# Patient Record
Sex: Female | Born: 1935 | Race: White | Hispanic: No | Smoking: Never smoker
Health system: Southern US, Community
[De-identification: ages and names within clinical notes are randomized; demographics above are authoritative.]

## PROBLEM LIST (undated history)

## (undated) DIAGNOSIS — I429 Cardiomyopathy, unspecified: Secondary | ICD-10-CM

## (undated) DIAGNOSIS — I10 Essential (primary) hypertension: Secondary | ICD-10-CM

## (undated) DIAGNOSIS — D126 Benign neoplasm of colon, unspecified: Secondary | ICD-10-CM

## (undated) DIAGNOSIS — E119 Type 2 diabetes mellitus without complications: Secondary | ICD-10-CM

## (undated) DIAGNOSIS — D62 Acute posthemorrhagic anemia: Secondary | ICD-10-CM

## (undated) DIAGNOSIS — K5792 Diverticulitis of intestine, part unspecified, without perforation or abscess without bleeding: Secondary | ICD-10-CM

## (undated) DIAGNOSIS — I4891 Unspecified atrial fibrillation: Secondary | ICD-10-CM

## (undated) DIAGNOSIS — E0590 Thyrotoxicosis, unspecified without thyrotoxic crisis or storm: Secondary | ICD-10-CM

## (undated) DIAGNOSIS — E785 Hyperlipidemia, unspecified: Secondary | ICD-10-CM

## (undated) DIAGNOSIS — F32A Depression, unspecified: Secondary | ICD-10-CM

## (undated) DIAGNOSIS — D569 Thalassemia, unspecified: Secondary | ICD-10-CM

## (undated) DIAGNOSIS — D509 Iron deficiency anemia, unspecified: Secondary | ICD-10-CM

## (undated) DIAGNOSIS — E7800 Pure hypercholesterolemia, unspecified: Secondary | ICD-10-CM

## (undated) DIAGNOSIS — D649 Anemia, unspecified: Secondary | ICD-10-CM

## (undated) DIAGNOSIS — F0390 Unspecified dementia without behavioral disturbance: Secondary | ICD-10-CM

## (undated) HISTORY — DX: Pure hypercholesterolemia, unspecified: E78.00

## (undated) HISTORY — DX: Cardiomyopathy, unspecified: I42.9

## (undated) HISTORY — DX: Diverticulitis of intestine, part unspecified, without perforation or abscess without bleeding: K57.92

## (undated) HISTORY — DX: Thalassemia, unspecified: D56.9

## (undated) HISTORY — DX: Unspecified atrial fibrillation: I48.91

## (undated) HISTORY — PX: THYROIDECTOMY, PARTIAL: SHX18

## (undated) HISTORY — DX: Iron deficiency anemia, unspecified: D50.9

## (undated) HISTORY — DX: Benign neoplasm of colon, unspecified: D12.6

## (undated) HISTORY — PX: FOOT SURGERY: SHX648

## (undated) HISTORY — DX: Essential (primary) hypertension: I10

## (undated) HISTORY — DX: Thyrotoxicosis, unspecified without thyrotoxic crisis or storm: E05.90

## (undated) HISTORY — PX: CHOLECYSTECTOMY: SHX55

---

## 2000-05-05 ENCOUNTER — Encounter: Payer: Self-pay | Admitting: Neurosurgery

## 2000-05-05 ENCOUNTER — Ambulatory Visit (HOSPITAL_COMMUNITY): Admission: RE | Admit: 2000-05-05 | Discharge: 2000-05-05 | Payer: Self-pay | Admitting: Neurosurgery

## 2001-02-05 ENCOUNTER — Encounter: Payer: Self-pay | Admitting: Obstetrics and Gynecology

## 2001-02-05 ENCOUNTER — Ambulatory Visit (HOSPITAL_COMMUNITY): Admission: RE | Admit: 2001-02-05 | Discharge: 2001-02-05 | Payer: Self-pay | Admitting: Obstetrics and Gynecology

## 2001-04-13 ENCOUNTER — Ambulatory Visit (HOSPITAL_COMMUNITY): Admission: RE | Admit: 2001-04-13 | Discharge: 2001-04-13 | Payer: Self-pay | Admitting: Obstetrics and Gynecology

## 2001-04-13 ENCOUNTER — Encounter: Payer: Self-pay | Admitting: Obstetrics and Gynecology

## 2001-05-05 ENCOUNTER — Encounter: Payer: Self-pay | Admitting: Neurosurgery

## 2001-05-05 ENCOUNTER — Ambulatory Visit (HOSPITAL_COMMUNITY): Admission: RE | Admit: 2001-05-05 | Discharge: 2001-05-05 | Payer: Self-pay | Admitting: Neurosurgery

## 2001-05-25 ENCOUNTER — Other Ambulatory Visit: Admission: RE | Admit: 2001-05-25 | Discharge: 2001-05-25 | Payer: Self-pay | Admitting: Obstetrics and Gynecology

## 2001-12-21 ENCOUNTER — Encounter (HOSPITAL_COMMUNITY): Admission: RE | Admit: 2001-12-21 | Discharge: 2002-01-20 | Payer: Self-pay | Admitting: Neurosurgery

## 2002-05-05 ENCOUNTER — Encounter: Payer: Self-pay | Admitting: Neurosurgery

## 2002-05-05 ENCOUNTER — Ambulatory Visit (HOSPITAL_COMMUNITY): Admission: RE | Admit: 2002-05-05 | Discharge: 2002-05-05 | Payer: Self-pay | Admitting: Neurosurgery

## 2002-05-17 ENCOUNTER — Encounter: Payer: Self-pay | Admitting: Neurosurgery

## 2002-05-17 ENCOUNTER — Ambulatory Visit (HOSPITAL_COMMUNITY): Admission: RE | Admit: 2002-05-17 | Discharge: 2002-05-17 | Payer: Self-pay | Admitting: Neurosurgery

## 2002-07-19 ENCOUNTER — Ambulatory Visit (HOSPITAL_COMMUNITY): Admission: RE | Admit: 2002-07-19 | Discharge: 2002-07-19 | Payer: Self-pay | Admitting: Obstetrics & Gynecology

## 2002-07-19 ENCOUNTER — Encounter: Payer: Self-pay | Admitting: Obstetrics & Gynecology

## 2003-04-20 ENCOUNTER — Ambulatory Visit (HOSPITAL_COMMUNITY): Admission: RE | Admit: 2003-04-20 | Discharge: 2003-04-20 | Payer: Self-pay | Admitting: Internal Medicine

## 2003-04-22 ENCOUNTER — Ambulatory Visit (HOSPITAL_COMMUNITY): Admission: RE | Admit: 2003-04-22 | Discharge: 2003-04-22 | Payer: Self-pay | Admitting: Internal Medicine

## 2003-04-22 ENCOUNTER — Encounter: Payer: Self-pay | Admitting: Internal Medicine

## 2003-08-02 ENCOUNTER — Ambulatory Visit (HOSPITAL_COMMUNITY): Admission: RE | Admit: 2003-08-02 | Discharge: 2003-08-02 | Payer: Self-pay | Admitting: Internal Medicine

## 2004-09-04 ENCOUNTER — Ambulatory Visit (HOSPITAL_COMMUNITY): Admission: RE | Admit: 2004-09-04 | Discharge: 2004-09-04 | Payer: Self-pay | Admitting: Internal Medicine

## 2005-08-15 ENCOUNTER — Ambulatory Visit (HOSPITAL_COMMUNITY): Admission: RE | Admit: 2005-08-15 | Discharge: 2005-08-15 | Payer: Self-pay | Admitting: *Deleted

## 2005-10-08 ENCOUNTER — Ambulatory Visit (HOSPITAL_COMMUNITY): Admission: RE | Admit: 2005-10-08 | Discharge: 2005-10-08 | Payer: Self-pay | Admitting: Obstetrics & Gynecology

## 2006-02-06 ENCOUNTER — Emergency Department (HOSPITAL_COMMUNITY): Admission: EM | Admit: 2006-02-06 | Discharge: 2006-02-06 | Payer: Self-pay | Admitting: *Deleted

## 2006-07-02 ENCOUNTER — Ambulatory Visit (HOSPITAL_COMMUNITY): Admission: RE | Admit: 2006-07-02 | Discharge: 2006-07-02 | Payer: Self-pay | Admitting: Family Medicine

## 2006-10-21 ENCOUNTER — Ambulatory Visit (HOSPITAL_COMMUNITY): Admission: RE | Admit: 2006-10-21 | Discharge: 2006-10-21 | Payer: Self-pay | Admitting: Obstetrics & Gynecology

## 2007-02-17 ENCOUNTER — Encounter (HOSPITAL_COMMUNITY): Admission: RE | Admit: 2007-02-17 | Discharge: 2007-03-19 | Payer: Self-pay | Admitting: Endocrinology

## 2007-09-25 ENCOUNTER — Ambulatory Visit (HOSPITAL_COMMUNITY): Admission: RE | Admit: 2007-09-25 | Discharge: 2007-09-25 | Payer: Self-pay | Admitting: Family Medicine

## 2007-11-04 ENCOUNTER — Ambulatory Visit (HOSPITAL_COMMUNITY): Admission: RE | Admit: 2007-11-04 | Discharge: 2007-11-04 | Payer: Self-pay | Admitting: Internal Medicine

## 2007-11-17 ENCOUNTER — Ambulatory Visit (HOSPITAL_COMMUNITY): Admission: RE | Admit: 2007-11-17 | Discharge: 2007-11-17 | Payer: Self-pay | Admitting: General Surgery

## 2008-01-14 ENCOUNTER — Ambulatory Visit (HOSPITAL_COMMUNITY): Admission: RE | Admit: 2008-01-14 | Discharge: 2008-01-14 | Payer: Self-pay | Admitting: Internal Medicine

## 2008-01-15 ENCOUNTER — Inpatient Hospital Stay (HOSPITAL_COMMUNITY): Admission: EM | Admit: 2008-01-15 | Discharge: 2008-01-18 | Payer: Self-pay | Admitting: Emergency Medicine

## 2008-05-16 ENCOUNTER — Other Ambulatory Visit: Admission: RE | Admit: 2008-05-16 | Discharge: 2008-05-16 | Payer: Self-pay | Admitting: Obstetrics and Gynecology

## 2008-09-19 ENCOUNTER — Emergency Department (HOSPITAL_COMMUNITY): Admission: EM | Admit: 2008-09-19 | Discharge: 2008-09-19 | Payer: Self-pay | Admitting: Emergency Medicine

## 2008-09-23 ENCOUNTER — Encounter (INDEPENDENT_AMBULATORY_CARE_PROVIDER_SITE_OTHER): Payer: Self-pay | Admitting: *Deleted

## 2008-10-11 ENCOUNTER — Ambulatory Visit (HOSPITAL_COMMUNITY): Payer: Self-pay | Admitting: Oncology

## 2008-10-24 DIAGNOSIS — I10 Essential (primary) hypertension: Secondary | ICD-10-CM | POA: Insufficient documentation

## 2008-10-24 DIAGNOSIS — Z8639 Personal history of other endocrine, nutritional and metabolic disease: Secondary | ICD-10-CM

## 2008-10-24 DIAGNOSIS — Z862 Personal history of diseases of the blood and blood-forming organs and certain disorders involving the immune mechanism: Secondary | ICD-10-CM

## 2008-10-24 DIAGNOSIS — M810 Age-related osteoporosis without current pathological fracture: Secondary | ICD-10-CM | POA: Insufficient documentation

## 2008-10-24 DIAGNOSIS — E7800 Pure hypercholesterolemia, unspecified: Secondary | ICD-10-CM

## 2008-10-24 DIAGNOSIS — E089 Diabetes mellitus due to underlying condition without complications: Secondary | ICD-10-CM | POA: Insufficient documentation

## 2008-10-24 DIAGNOSIS — D509 Iron deficiency anemia, unspecified: Secondary | ICD-10-CM | POA: Insufficient documentation

## 2008-10-25 ENCOUNTER — Ambulatory Visit: Payer: Self-pay | Admitting: Internal Medicine

## 2008-10-25 DIAGNOSIS — D568 Other thalassemias: Secondary | ICD-10-CM

## 2008-10-25 DIAGNOSIS — D539 Nutritional anemia, unspecified: Secondary | ICD-10-CM | POA: Insufficient documentation

## 2008-10-25 DIAGNOSIS — K5732 Diverticulitis of large intestine without perforation or abscess without bleeding: Secondary | ICD-10-CM

## 2008-10-26 DIAGNOSIS — I4891 Unspecified atrial fibrillation: Secondary | ICD-10-CM | POA: Insufficient documentation

## 2008-10-27 ENCOUNTER — Encounter: Payer: Self-pay | Admitting: Internal Medicine

## 2008-11-03 ENCOUNTER — Encounter: Payer: Self-pay | Admitting: Internal Medicine

## 2008-11-07 ENCOUNTER — Ambulatory Visit: Payer: Self-pay | Admitting: Internal Medicine

## 2008-11-09 ENCOUNTER — Telehealth: Payer: Self-pay | Admitting: Gastroenterology

## 2008-11-10 ENCOUNTER — Encounter: Payer: Self-pay | Admitting: Internal Medicine

## 2008-11-11 ENCOUNTER — Telehealth (INDEPENDENT_AMBULATORY_CARE_PROVIDER_SITE_OTHER): Payer: Self-pay

## 2008-11-11 ENCOUNTER — Encounter: Payer: Self-pay | Admitting: Gastroenterology

## 2008-11-11 LAB — CONVERTED CEMR LAB
Basophils Absolute: 0 10*3/uL (ref 0.0–0.1)
Basophils Relative: 0 % (ref 0–1)
Eosinophils Absolute: 0 10*3/uL (ref 0.0–0.7)
Eosinophils Relative: 0 % (ref 0–5)
HCT: 31.1 % — ABNORMAL LOW (ref 36.0–46.0)
Hemoglobin: 11.1 g/dL — ABNORMAL LOW (ref 12.0–15.0)
MCHC: 35.6 g/dL (ref 30.0–36.0)
MCV: 79.5 fL (ref 78.0–100.0)
Monocytes Absolute: 1.2 10*3/uL — ABNORMAL HIGH (ref 0.1–1.0)
Monocytes Relative: 9 % (ref 3–12)
Neutro Abs: 11.2 10*3/uL — ABNORMAL HIGH (ref 1.7–7.7)
RBC: 3.91 M/uL (ref 3.87–5.11)
RDW: 14.8 % (ref 11.5–15.5)

## 2008-11-23 ENCOUNTER — Ambulatory Visit: Payer: Self-pay | Admitting: Internal Medicine

## 2008-11-24 ENCOUNTER — Encounter: Payer: Self-pay | Admitting: Internal Medicine

## 2008-12-15 ENCOUNTER — Encounter: Payer: Self-pay | Admitting: Internal Medicine

## 2008-12-15 ENCOUNTER — Ambulatory Visit (HOSPITAL_COMMUNITY): Admission: RE | Admit: 2008-12-15 | Discharge: 2008-12-15 | Payer: Self-pay | Admitting: Internal Medicine

## 2008-12-15 ENCOUNTER — Ambulatory Visit: Payer: Self-pay | Admitting: Internal Medicine

## 2008-12-15 DIAGNOSIS — D126 Benign neoplasm of colon, unspecified: Secondary | ICD-10-CM

## 2008-12-15 HISTORY — DX: Benign neoplasm of colon, unspecified: D12.6

## 2008-12-16 ENCOUNTER — Encounter: Payer: Self-pay | Admitting: Internal Medicine

## 2008-12-23 ENCOUNTER — Ambulatory Visit (HOSPITAL_COMMUNITY): Admission: RE | Admit: 2008-12-23 | Discharge: 2008-12-23 | Payer: Self-pay | Admitting: Internal Medicine

## 2009-06-02 ENCOUNTER — Encounter (INDEPENDENT_AMBULATORY_CARE_PROVIDER_SITE_OTHER): Payer: Self-pay | Admitting: *Deleted

## 2009-07-17 ENCOUNTER — Ambulatory Visit (HOSPITAL_COMMUNITY): Admission: RE | Admit: 2009-07-17 | Discharge: 2009-07-17 | Payer: Self-pay | Admitting: Ophthalmology

## 2009-07-31 ENCOUNTER — Ambulatory Visit: Payer: Self-pay | Admitting: Internal Medicine

## 2009-07-31 DIAGNOSIS — Z860100 Personal history of colon polyps, unspecified: Secondary | ICD-10-CM | POA: Insufficient documentation

## 2009-08-01 ENCOUNTER — Ambulatory Visit (HOSPITAL_COMMUNITY): Admission: RE | Admit: 2009-08-01 | Discharge: 2009-08-01 | Payer: Self-pay | Admitting: Cardiology

## 2009-08-11 ENCOUNTER — Ambulatory Visit (HOSPITAL_COMMUNITY): Admission: RE | Admit: 2009-08-11 | Discharge: 2009-08-11 | Payer: Self-pay | Admitting: Family Medicine

## 2009-09-08 ENCOUNTER — Ambulatory Visit (HOSPITAL_COMMUNITY): Admission: RE | Admit: 2009-09-08 | Discharge: 2009-09-08 | Payer: Self-pay | Admitting: Family Medicine

## 2009-09-08 ENCOUNTER — Other Ambulatory Visit: Admission: RE | Admit: 2009-09-08 | Discharge: 2009-09-08 | Payer: Self-pay | Admitting: Obstetrics & Gynecology

## 2009-09-26 ENCOUNTER — Encounter: Admission: RE | Admit: 2009-09-26 | Discharge: 2009-09-26 | Payer: Self-pay | Admitting: Endocrinology

## 2009-09-26 ENCOUNTER — Other Ambulatory Visit: Admission: RE | Admit: 2009-09-26 | Discharge: 2009-09-26 | Payer: Self-pay | Admitting: Interventional Radiology

## 2009-10-05 ENCOUNTER — Encounter (HOSPITAL_COMMUNITY): Admission: RE | Admit: 2009-10-05 | Discharge: 2009-11-04 | Payer: Self-pay | Admitting: Oncology

## 2009-10-05 ENCOUNTER — Ambulatory Visit (HOSPITAL_COMMUNITY): Payer: Self-pay | Admitting: Oncology

## 2009-12-19 ENCOUNTER — Emergency Department (HOSPITAL_COMMUNITY): Admission: EM | Admit: 2009-12-19 | Discharge: 2009-12-19 | Payer: Self-pay | Admitting: Emergency Medicine

## 2010-02-02 ENCOUNTER — Encounter: Admission: RE | Admit: 2010-02-02 | Discharge: 2010-02-02 | Payer: Self-pay | Admitting: Endocrinology

## 2010-03-28 ENCOUNTER — Ambulatory Visit: Payer: Self-pay | Admitting: Orthopedic Surgery

## 2010-03-28 DIAGNOSIS — S63016A Dislocation of distal radioulnar joint of unspecified wrist, initial encounter: Secondary | ICD-10-CM

## 2010-04-02 ENCOUNTER — Encounter: Payer: Self-pay | Admitting: Orthopedic Surgery

## 2010-04-16 ENCOUNTER — Telehealth (INDEPENDENT_AMBULATORY_CARE_PROVIDER_SITE_OTHER): Payer: Self-pay

## 2010-04-30 ENCOUNTER — Ambulatory Visit (HOSPITAL_COMMUNITY): Admission: RE | Admit: 2010-04-30 | Discharge: 2010-04-30 | Payer: Self-pay | Admitting: Ophthalmology

## 2010-06-08 IMAGING — CT CT ABDOMEN W/ CM
1 of 3 series · 14 of 32 positions shown, 19 images · IV contrast (Omnipaque 300)
Comparison: 01/14/2008

CT ABDOMEN

CLINICAL DATA: Diverticulitis, abscess, follow-up, history
diabetes

CT ABDOMEN AND PELVIS WITH CONTRAST
TECHNIQUE: Multidetector CT imaging of the abdomen and pelvis was
performed using the standard protocol following bolus
administration of intravenous contrast.
Contrast: Dilute oral contrast and 100 ml Nmnipaque-5BB

[Series 2: abd_pel 5.0 b40f · axial · 0.62mm/px · z∈[-412,-28]mm · 14 of 89 slices shown, 19 images]
[im 6/89  soft-tissue]
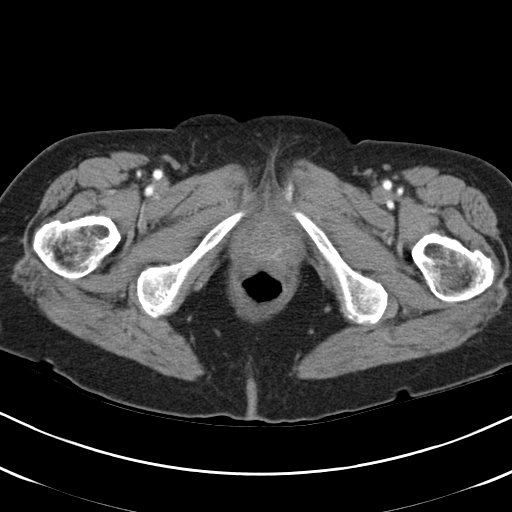
[im 6/89  bone]
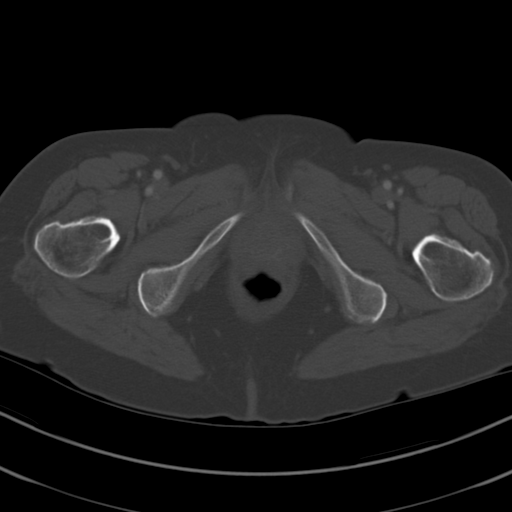
[im 11/89  soft-tissue]
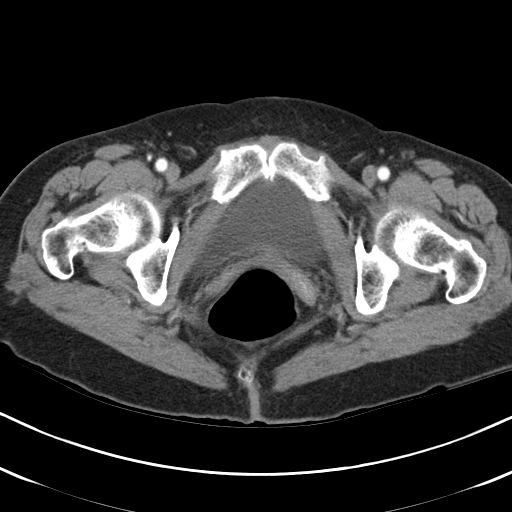
[im 21/89  soft-tissue]
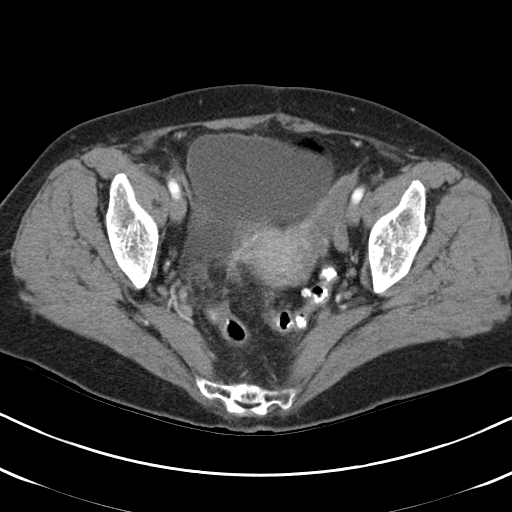
[im 26/89  soft-tissue]
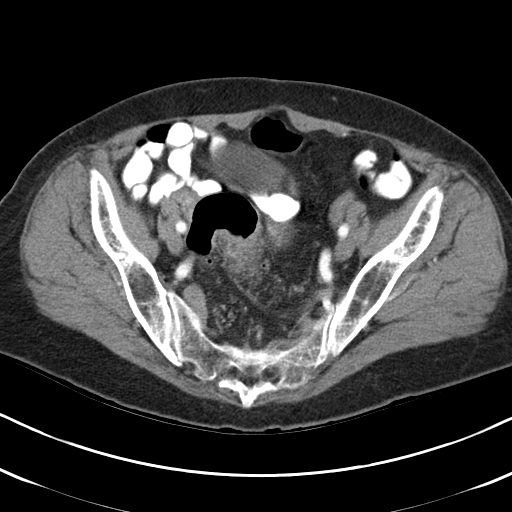
[im 32/89  soft-tissue]
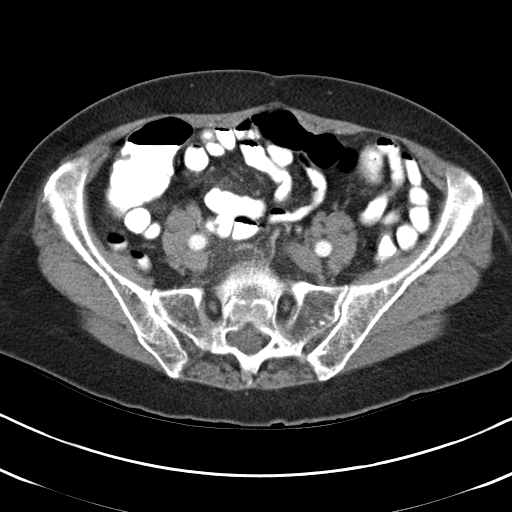
[im 37/89  soft-tissue]
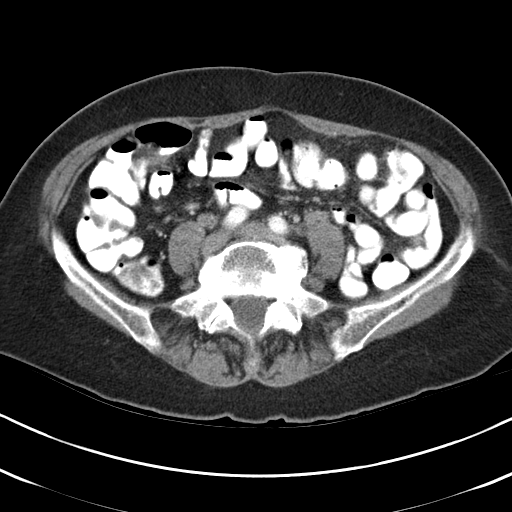
[im 47/89  soft-tissue]
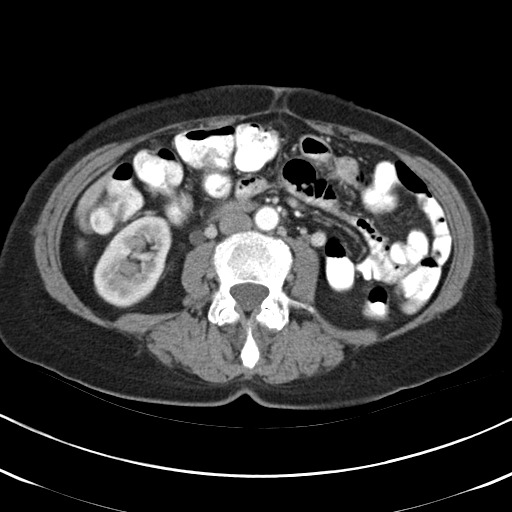
[im 52/89  soft-tissue]
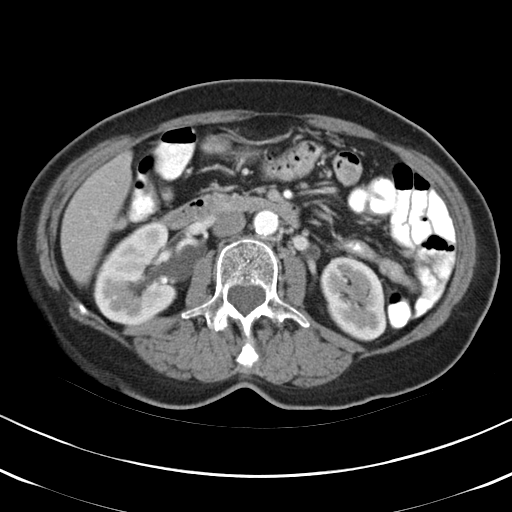
[im 57/89  soft-tissue]
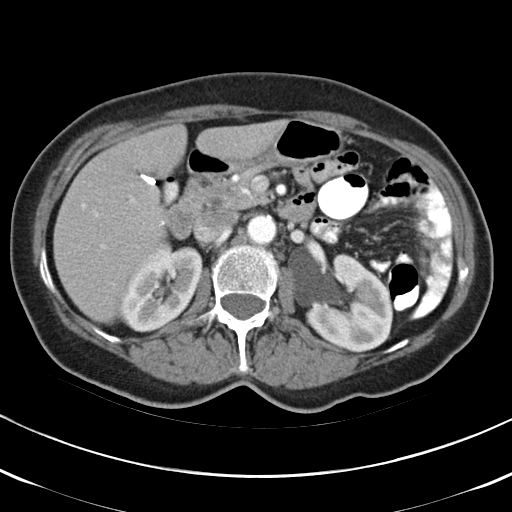
[im 57/89  bone]
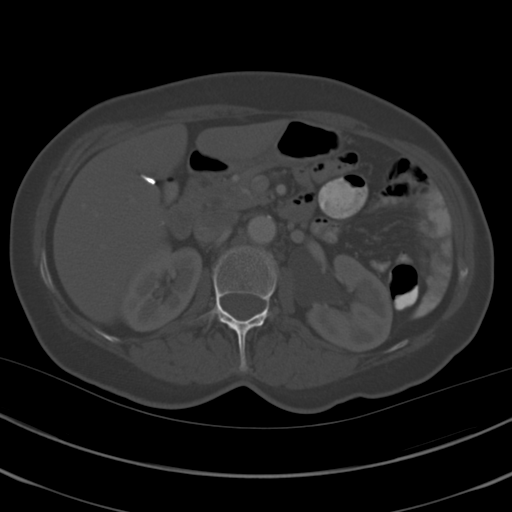
[im 63/89  soft-tissue]
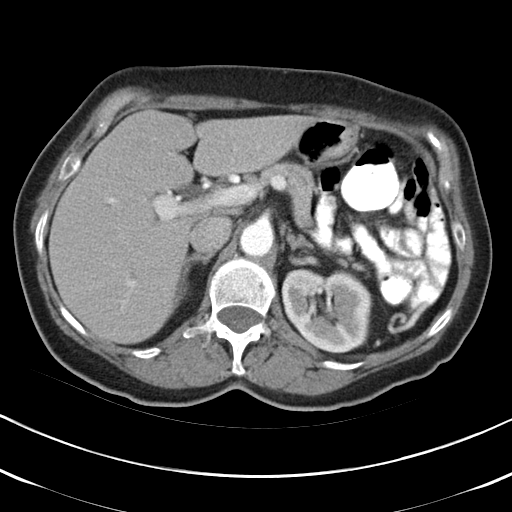
[im 68/89  soft-tissue]
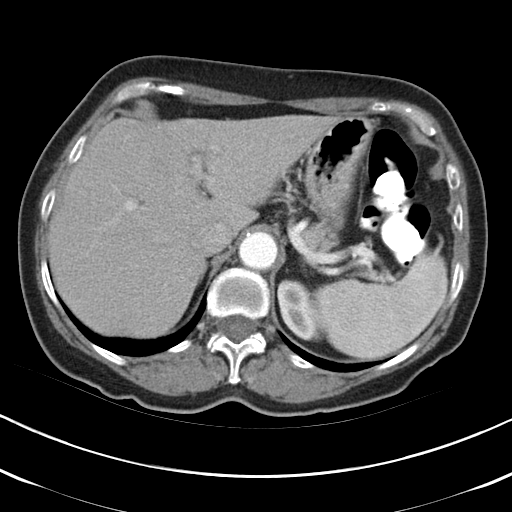
[im 68/89  lung]
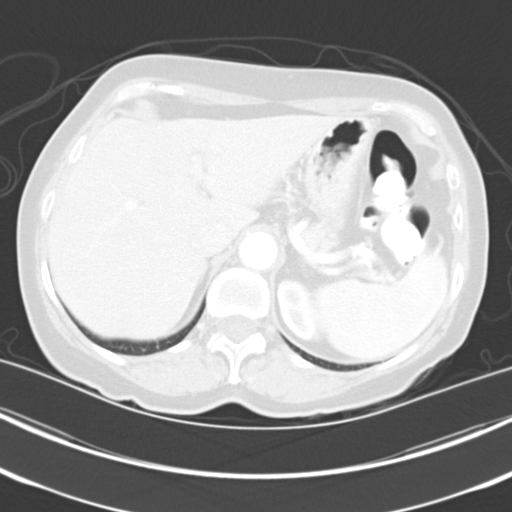
[im 73/89  lung]
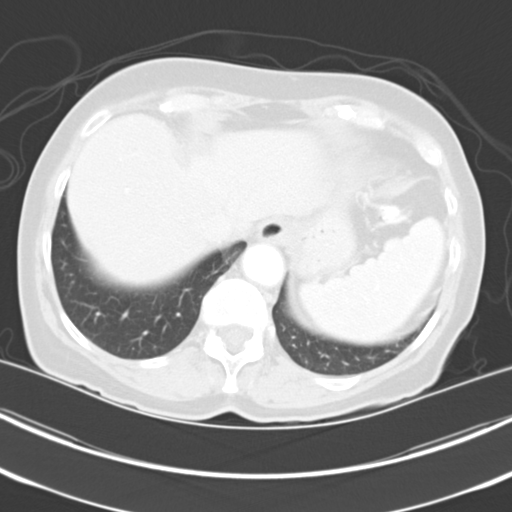
[im 78/89  soft-tissue]
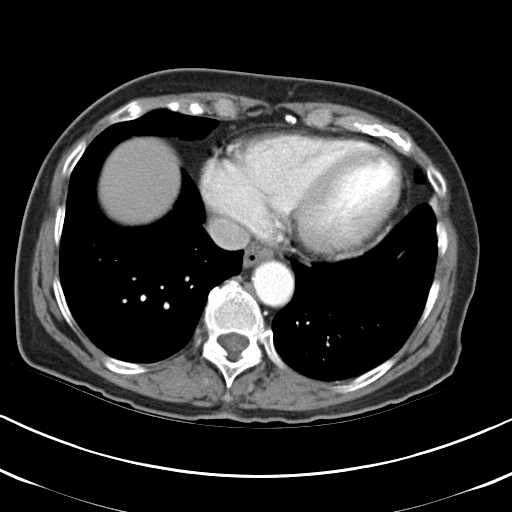
[im 78/89  lung]
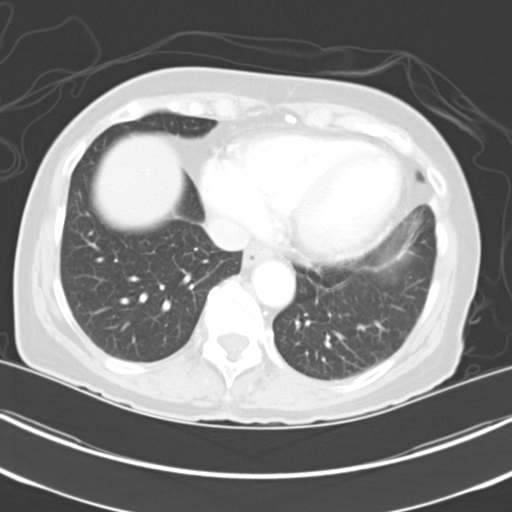
[im 83/89  soft-tissue]
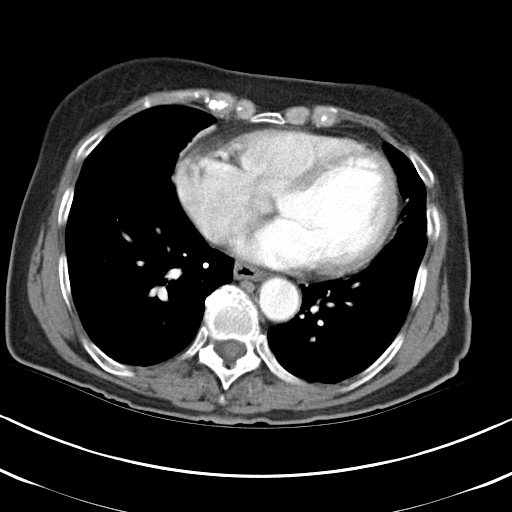
[im 83/89  lung]
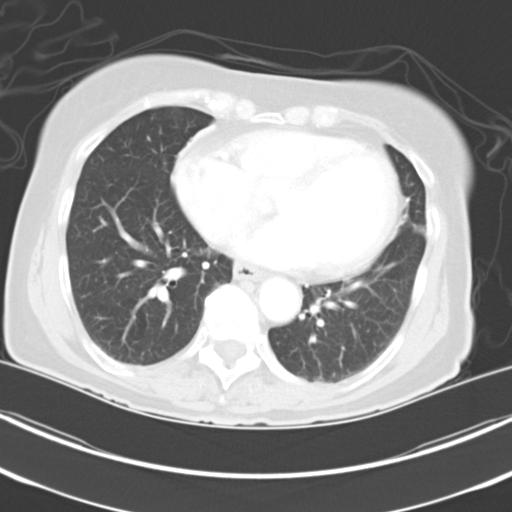

[14 of 32 positions shown; findings below may reference images not displayed]

FINDINGS: Lung bases clear.
8 mm diameter hypervascular focus right lobe liver, unchanged,
question atypical hemangioma though other etiologies are not
excluded.
Calcified granuloma inferior right lobe liver.
Upper normal spleen size.
No other focal abnormalities of liver, spleen, pancreas, kidneys,
or adrenal glands.
Stomach and upper abdominal bowel loops normal.
No mass, adenopathy, or free fluid.
IMPRESSION: Stable 8 mm diameter hypervascular focus in right lobe of liver,
question atypical hemangioma, recommend either characterization by
MRI or follow-up CT in 6 months to establish stability.

CT PELVIS
FINDINGS: Segmental wall thickening of mid to distal sigmoid colon identified
compatible with diverticulitis.
Few scattered sigmoid diverticula noted.
The small fluid collection in the sigmoid mesocolon adjacent to the
sigmoid wall on preceding CT is smaller and less well defined,
compatible with decreased size of tiny diverticular abscess, now 6
mm in diameter.
No pelvic mass, adenopathy, or free pelvic fluid.
Bladder, uterus, and adnexae unremarkable.
Remaining pelvic bowel loops normal.
No acute bony abnormalities.
IMPRESSION: Improving changes of diverticulitis of mid to distal sigmoid colon
with decreased size of tiny diverticular abscess since previous
study.

## 2010-06-11 ENCOUNTER — Telehealth: Payer: Self-pay | Admitting: Orthopedic Surgery

## 2010-06-15 ENCOUNTER — Telehealth: Payer: Self-pay | Admitting: Orthopedic Surgery

## 2010-08-02 ENCOUNTER — Encounter
Admission: RE | Admit: 2010-08-02 | Discharge: 2010-08-02 | Payer: Self-pay | Source: Home / Self Care | Attending: Endocrinology | Admitting: Endocrinology

## 2010-08-05 ENCOUNTER — Encounter: Payer: Self-pay | Admitting: Family Medicine

## 2010-08-05 ENCOUNTER — Encounter: Payer: Self-pay | Admitting: Obstetrics & Gynecology

## 2010-08-05 ENCOUNTER — Encounter: Payer: Self-pay | Admitting: General Surgery

## 2010-08-06 ENCOUNTER — Encounter: Payer: Self-pay | Admitting: Obstetrics & Gynecology

## 2010-08-14 NOTE — Letter (Signed)
Summary: History form  History form   Imported By: Jacklynn Ganong 04/04/2010 09:06:20  _____________________________________________________________________  External Attachment:    Type:   Image     Comment:   External Document

## 2010-08-14 NOTE — Assessment & Plan Note (Signed)
Summary: pp fu in 6 months/ss   Visit Type:  Follow-up Visit Primary Care Provider:  [facility]  Chief Complaint:  F/U /Diverticulitis.  History of Present Illness:      This is a 77 year old female who presents with hx diverticular disease.  The patient denies nausea, vomiting, diarrhea, constipation, abdominal pain, jaundice, melena, hematochezia, heartburn, fever, weight loss, dysuria, hematuria, urinary frequency, urinary urgency, fecaluria, and recurrent UTIs.  Will have annual check-up/labs thru [facility] at [place] within the month.  Takes OTC metamucil and high fiber diet.  She"s pleased w her Bms.  Questions whether she should take baby ASA per her cardiologist's recommendations?  Lost her husband 02/2009.  Current Problems (verified): 1)  Other Thalassemia  (ICD-282.49) 2)  Anemia, Chronic  (ICD-281.9) 3)  Paroxysmal Atrial Fibrillation  (ICD-427.31) 4)  Diverticulitis, Colon  (ICD-562.11) 5)  Hyperthyroidism, Hx of  (ICD-V12.2) 6)  Dm  (ICD-250.00) 7)  Hypertension  (ICD-401.9) 8)  Osteoporosis  (ICD-733.00) 9)  Hypercholesterolemia  (ICD-272.0) 10)  Anemia, Iron Deficiency  (ICD-280.9)  Current Medications (verified): 1)  Mvi .... Once Daily 2)  Glucosamine-Msm 500-400 Mg Caps (Glucosamine Sulfate-Msm) .... Two Times A Day 3)  Cvs Vitamin B-6 100 Mg Tabs (Pyridoxine Hcl) .... Once Daily 4)  Actonel 30 Mg Tabs (Risedronate Sodium) .... Take Once A Week 5)  Metoprolol Tartrate 25 Mg Tabs (Metoprolol Tartrate) .... Take 1 Tablet By Mouth Two Times A Day 6)  Benicar Hct 40-12.5 Mg Tabs (Olmesartan Medoxomil-Hctz) .... Take 1 Tablet By Mouth Once A Day 7)  Simvastatin 40 Mg Tabs (Simvastatin) .... Take 1 Tablet By Mouth Once A Day 8)  Vagifem 25 Mcg Tabs (Estradiol) .... As Directed 9)  Co Q 10 .... Daily 10)  Calcium 600 Plus D .... Take 2 Tablets By Mouth Once A Day 11)  Fish Oil 1000mg  .... Take Two Tablets Daily  Allergies (verified): 1)  ! * Novacaine 2)  ! Pcn 3)  !  Sulfa  Past History:  Past Medical History:   Current Problems (verified):  1)  Other Thalassemia  (ICD-282.49), Minor 2)  Anemia, Chronic  (ICD-281.9) 3)  Paroxysmal Atrial Fibrillation  (ICD-427.31) 4)  Diverticulitis, Colon  (ICD-562.11), second documented episode 5)  Hyperthyroidism, Hx of  (ICD-V12.2), h/o iodine radiation followed by [facility].  Previous partial thyroidectomy  remotely 6)  Dm  (ICD-250.00) 7)  Hypertension  (ICD-401.9) 8)  Osteoporosis  (ICD-733.00) 9)  Hypercholesterolemia  (ICD-272.0) 10)  Anemia, Iron Deficiency  (ICD-280.9) 11) Carotid artery stenosis  Vital Signs:  Patient profile:   77 year old female Height:      52 inches Weight:      124.50 pounds BMI:     32.49 Temp:     97.6 degrees F oral Pulse rate:   64 / minute BP sitting:   124 / 76  (left arm)  Vitals Entered By: Cloria Spring LPN (July 31, 2009 10:59 AM)  Physical Exam  General:  Well developed, well nourished, no acute distress. Head:  Normocephalic and atraumatic. Eyes:  PERRLA, no icterus. Nose:  No deformity, discharge,  or lesions. Mouth:  No deformity or lesions, dentition normal. Neck:  Supple; no masses or thyromegaly. Heart:  Regular rate and rhythm; no murmurs, rubs,  or bruits. Abdomen:  Soft, nontender and nondistended. No masses, hepatosplenomegaly or hernias noted. Normal bowel sounds.without guarding.   Msk:  Symmetrical with no gross deformities. Normal posture. Extremities:  trace pedal edema.   Neurologic:  Alert  and  oriented x4;  grossly normal neurologically. Skin:  Intact without significant lesions or rashes. Psych:  Alert and cooperative. Normal mood and affect.  Impression & Recommendations:  Problem # 1:  Hx of DIVERTICULITIS, COLON (ICD-562.11)  Doing well on high fiber diet.  No GI concerns today.  Orders: Est. Patient Level III (16109)  Problem # 2:  OTHER THALASSEMIA (ICD-282.49) Assessment: Unchanged  Orders: Est. Patient Level III  (60454)  Problem # 3:  COLONIC POLYPS, ADENOMATOUS, HX OF (ICD-V12.72) Assessment: Comment Only  Orders: Est. Patient Level III (09811)  Patient Instructions: 1)  Colonoscopy 12/2013 2)  Call if any problems, otherwise follow up with your primary care provider 3)  Continue high fiber diet and daily fiber supplement 4)  Have your labs faxed to our office for review

## 2010-08-14 NOTE — Assessment & Plan Note (Signed)
Summary: RT WRIST PAIN/NEED XRAY/MEDICARE,UHC/CAF   Vital Signs:  Patient profile:   77 year old female Height:      62 inches Weight:      122 pounds Pulse rate:   76 / minute Resp:     16 per minute  Vitals Entered By: Fuller Canada MD (March 28, 2010 10:00 AM)  Visit Type:  new patient Referring Provider:  self Primary Provider:  Sherwood Gambler  CC:  right wrist.  History of Present Illness: I saw [name] in the office today for an initial visit.  She is a 77 years old woman with the complaint of:  right wrist pain.  No injury.  Xrays today in our office.  Meds: Benicar, Metoprolol, Simvastatin, ASA, Vitamins, Calcium plus D, CoQ10.   This patient comes in with 3 week history of pain over the ulnar side of her RIGHT wrist with some swelling denies any injury pain radiates up into her forearm denies any numbness.  She is dull intermittent pain of gradual onset which is 8/10.  Denies any stiffness.  She did place a wrist wrap on it and that helped and she is to be engaged in heat.  She tells me that she had a job which required chronic ulnar deviation of her wrist and she is also been doing some activities which require the same.     Allergies: 1)  ! * Novacaine 2)  ! Pcn 3)  ! 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  ALCOHOL OCCASIONAL 3 cups of caffeine daily 10th grade ed. Illicit Drug Use - no  Review of Systems Constitutional:  Denies weight loss, weight gain, fever, chills, and  fatigue. Cardiovascular:  Denies chest pain, palpitations, fainting, and murmurs. Respiratory:  Denies short of breath, wheezing, couch, tightness, pain on inspiration, and snoring . Gastrointestinal:  Denies heartburn, nausea, vomiting, diarrhea, constipation, and blood in your stools. Genitourinary:  Denies frequency, urgency, difficulty urinating, painful urination, flank pain, and bleeding in urine. Neurologic:  Denies numbness, tingling, unsteady gait, dizziness, tremors, and seizure. Musculoskeletal:  Denies joint pain, swelling, instability, stiffness, redness, heat, and muscle pain. Endocrine:  Denies excessive thirst, exessive urination, and heat or cold intolerance. Psychiatric:  Denies nervousness, depression, anxiety, and hallucinations. Skin:  Denies changes in the skin, poor healing, rash, itching, and redness. HEENT:  Denies blurred or double vision, eye pain, redness, and watering. Immunology:  Complains of seasonal allergies; denies sinus problems and allergic to bee stings. Hemoatologic:  Denies easy bleeding and brusing.  Physical Exam  Skin:  intact without lesions or rashes Cervical Nodes:  no significant adenopathy Psych:  alert and cooperative; normal mood and affect; normal attention span and concentration Additional Exam:  GEN: well developed, well nourished, normal grooming and hygiene, no deformity and normal body habitus.   CDV: pulses are normal, no edema, no erythema. no tenderness  Lymph: normal lymph nodes   Skin: no rashes, skin lesions or open sores   NEURO: normal coordination, reflexes, sensation.   Psyche: awake, alert and oriented. Mood normal   The RIGHT and LEFT wrists are examined.  Inspection reveals deformity of the RIGHT wrist compared to the LEFT with swelling over the radial ulnar joint.  Although the range of motion and strength remained normal the radial ulnar joint is unstable when compared to the LEFT.     Wrist/Hand  Exam  General:    Well-developed, well-nourished, in no acute distress; alert and oriented x 3.    Inspection:    she has swelling over the radial ulnar joint when compared to the LEFT wrist it is dorsal  Palpation:    there is tenderness at the radial ulnar joint not at the ulnar carpal joint  Vascular:    Radial, ulnar, brachial, and axillary pulses 2+ and symmetric; capillary refill less than 2 seconds; no evidence of ischemia, clubbing, or cyanosis.    Sensory:    Gross sensation intact in the upper extremities.    Motor:    Normal strength in the upper extremities.    Wrist Exam:    Right:    Stability:  unstable, radial ulnar joint    Swelling:  between the radial ulnar joint there is swelling of the soft tissue with tenderness and subluxation of the radial ulnar joint    Inspection/Palpation:  normal range of motion    Left:    Inspection:  Normal    Palpation:  Normal    Stability:  stable    normal range of motion  Tinel's:    Tinel's negative over cubital, pronator, carpal, and Guyon's area.    Watson's:    Right negative; Left negative Snuff Box Tenderness:    Right negative; Left negative   Impression & Recommendations:  Problem # 1:  CLOSED DISLOCATION OF DISTAL RADIOULNAR (ICD-833.01) Assessment New  radiographs obtained there is radiocarpal arthritis and degeneration with separation of the ulnar styloid chronic.  Impression radial ulnar osteoarthritis    Patient doing better recommend continue current treatment with wrist splinting for period of 6 weeks if no improvement then referred to hand surgeon if improved observation  Orders: New Patient Level III (81191) Wrist x-ray complete, minimum 3 views (73110)

## 2010-08-14 NOTE — Progress Notes (Signed)
Summary: Referral to Dr. Merlyn Lot.  Phone Note Outgoing Call   Call placed by: Waldon Reining,  June 15, 2010 8:04 AM Call placed to: Specialist Action Taken: Information Sent Summary of Call: I faxed a referral for this patient to Dr. Merlyn Lot to be seen for her right hand.

## 2010-08-14 NOTE — Progress Notes (Signed)
Summary: hand specialist referral made  Phone Note Call from Patient   Caller: Patient Summary of Call: Pt calle with complaint with her right wrist and hand still hurting.  asking about referral for hand specialist plse call @  [phone] Initial call taken by: Eugenio Hoes,  June 11, 2010 11:19 AM  Follow-up for Phone Call        made referral for hand specialist and advised patient Follow-up by: Ether Griffins,  June 11, 2010 11:44 AM

## 2010-08-14 NOTE — Progress Notes (Signed)
Summary: phone note/ nagging pain on left side of abdomen  Phone Note Call from Patient   Caller: Patient Summary of Call: Pt called and said she has had a "irritated feeling in her gut" for the last few weeks. Yesterday it was more of a nagging pain on her left side. She took some Maalox last night and today and that helped. She wants to know if she should come in and be seen or what is recommended. Please advise! Initial call taken by: Cloria Spring LPN,  April 16, 2010 8:44 AM     Appended Document: phone note/ nagging pain on left side of abdomen Needs OV  Appended Document: phone note/ nagging pain on left side of abdomen Pt informed. Said she will be gone this afternoon, OK to leave appt day and time on VM.  Appended Document: phone note/ nagging pain on left side of abdomen called pt to offer appt. but she said she was feeling much better and would wait to schedule appt at this time

## 2010-09-10 ENCOUNTER — Other Ambulatory Visit (HOSPITAL_COMMUNITY)
Admission: RE | Admit: 2010-09-10 | Discharge: 2010-09-10 | Disposition: A | Discharge status: Home / Self Care | Payer: Medicare Other | Source: Ambulatory Visit | Attending: Obstetrics & Gynecology | Admitting: Obstetrics & Gynecology

## 2010-09-10 ENCOUNTER — Other Ambulatory Visit: Payer: Self-pay | Admitting: Obstetrics & Gynecology

## 2010-09-10 DIAGNOSIS — Z124 Encounter for screening for malignant neoplasm of cervix: Secondary | ICD-10-CM | POA: Insufficient documentation

## 2010-09-10 DIAGNOSIS — Z139 Encounter for screening, unspecified: Secondary | ICD-10-CM

## 2010-09-17 ENCOUNTER — Ambulatory Visit (HOSPITAL_COMMUNITY)
Admission: RE | Admit: 2010-09-17 | Discharge: 2010-09-17 | Disposition: A | Discharge status: Home / Self Care | Payer: Medicare Other | Source: Ambulatory Visit | Attending: Obstetrics & Gynecology | Admitting: Obstetrics & Gynecology

## 2010-09-17 DIAGNOSIS — Z1231 Encounter for screening mammogram for malignant neoplasm of breast: Secondary | ICD-10-CM | POA: Insufficient documentation

## 2010-09-17 DIAGNOSIS — Z139 Encounter for screening, unspecified: Secondary | ICD-10-CM

## 2010-09-24 ENCOUNTER — Encounter: Payer: Self-pay | Admitting: Orthopedic Surgery

## 2010-09-25 ENCOUNTER — Ambulatory Visit (INDEPENDENT_AMBULATORY_CARE_PROVIDER_SITE_OTHER): Payer: Medicare Other | Admitting: Orthopedic Surgery

## 2010-09-25 ENCOUNTER — Encounter: Payer: Self-pay | Admitting: Orthopedic Surgery

## 2010-09-25 DIAGNOSIS — M23329 Other meniscus derangements, posterior horn of medial meniscus, unspecified knee: Secondary | ICD-10-CM | POA: Insufficient documentation

## 2010-10-01 LAB — DIFFERENTIAL
Eosinophils Absolute: 0.1 10*3/uL (ref 0.0–0.7)
Eosinophils Relative: 1 % (ref 0–5)
Lymphs Abs: 2.6 10*3/uL (ref 0.7–4.0)
Monocytes Relative: 10 % (ref 3–12)

## 2010-10-01 LAB — URINALYSIS, ROUTINE W REFLEX MICROSCOPIC
Glucose, UA: NEGATIVE mg/dL
Hgb urine dipstick: NEGATIVE
Ketones, ur: NEGATIVE mg/dL
pH: 5.5 (ref 5.0–8.0)

## 2010-10-01 LAB — CBC
HCT: 32.4 % — ABNORMAL LOW (ref 36.0–46.0)
MCHC: 34.9 g/dL (ref 30.0–36.0)
MCV: 73.6 fL — ABNORMAL LOW (ref 78.0–100.0)
Platelets: 205 10*3/uL (ref 150–400)
RBC: 4.4 MIL/uL (ref 3.87–5.11)
WBC: 9.7 10*3/uL (ref 4.0–10.5)

## 2010-10-01 LAB — BASIC METABOLIC PANEL
BUN: 13 mg/dL (ref 6–23)
CO2: 30 mEq/L (ref 19–32)
Chloride: 98 mEq/L (ref 96–112)
Potassium: 3.5 mEq/L (ref 3.5–5.1)

## 2010-10-02 NOTE — Assessment & Plan Note (Signed)
Summary: new prob,left knee pain needs xr/mcr/uhc/bsf   Vital Signs:  Patient profile:   77 year old female Height:      61.5 inches Weight:      128 pounds BMI:     23.88 Pulse rate:   74 / minute Resp:     16 per minute  Vitals Entered By: Fuller Canada MD (September 25, 2010 8:51 AM)  Visit Type:  new problem Referring Provider:  self Primary Provider:  Sherwood Gambler  CC:  left knee pain.  History of Present Illness: I saw [name] in the office today for a new problem visit.  She is a 77 years old woman with the complaint of:  left knee pain since January.    This 77 year old female was working out at [employer] doing some jumping jacks and fell and intense burning pain over the medial joint line which eventually led to swelling.  She has a feeling of catching especially when she's on the steps.  However over the last week or 2 things it seemed to get better.  She was injured in January of this year.  Her pain is now 1/10 it seems to come and go and is relieved by Tylenol.  Her knee does get worse when she has to flex the knee while sitting so she tends to keep it in extension.  No other treatment at this time Xrays today.  Meds: Actonel, Benicar, Simvastatin, Vagifen, Q10, Calcium plus D, Glucosamine, Fish oil, Amitriptyline, Tylenol as needed.    Allergies: 1)  ! * Novacaine 2)  ! Pcn 3)  ! Sulfa  Past History:  Past Medical History: Last updated: 07/31/2009   Current Problems (verified):  1)  Other Thalassemia  (ICD-282.49), Minor 2)  Anemia, Chronic  (ICD-281.9) 3)  Paroxysmal Atrial Fibrillation  (ICD-427.31) 4)  Diverticulitis, Colon  (ICD-562.11), second documented episode 5)  Hyperthyroidism, Hx of  (ICD-V12.2), h/o iodine radiation followed by [facility].  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  ALCOHOL OCCASIONAL 3 cups of caffeine daily 10th grade ed. Illicit Drug Use - no  Review of Systems Cardiovascular:  Complains of palpitations; denies chest pain, fainting, and murmurs. Musculoskeletal:  Complains of joint pain; denies swelling, instability, stiffness, redness, heat, and muscle pain. Psychiatric:  Complains of depression; denies nervousness, anxiety, and hallucinations.  The review of systems is negative for Constitutional, Respiratory, Gastrointestinal, Genitourinary, Neurologic, Endocrine, Skin, HEENT, Immunology, and Hemoatologic.  Physical Exam  Skin:  intact without lesions or rashes Psych:  alert and cooperative; normal mood and affect; normal attention span and concentration   Knee Exam  General:    Well-developed, well-nourished, normal body habitus; no deformities, normal grooming.  Gait:    Normal heel-toe gait pattern bilaterally.    Palpation:    tenderness L-medial joint line.    Vascular:    There was no swelling or varicose veins. The pulses and temperature are normal. There was no edema or tenderness.  Sensory:    Gross coordination and sensation were normal.    Motor:    Motor strength 5/5 bilaterally for quadriceps, hamstrings, ankle dorsiflexion, and  ankle plantar flexion.    Reflexes:    Normal and symmetric patellar and Achilles reflexes bilaterally.    Knee Exam:    Right:    Inspection:  Normal    Palpation:  Normal    Stability:  stable    Tenderness:  no    Swelling:  no    Erythema:  no    Range of Motion:       Flexion-Active: full       Extension-Active: full       Flexion-Passive: full       Extension-Passive:  full    Left:    Inspection:  Abnormal    Palpation:  Abnormal    Stability:  stable    Tenderness:  medial joint line    Swelling:  no    Erythema:  no    Range of Motion:       Flexion-Active: full       Extension-Active: full       Flexion-Passive: full       Extension-Passive: full   McMurray sign was negative, screw home test was negative.   Impression & Recommendations:  Problem # 1:  DERANGEMENT OF POSTERIOR HORN OF MEDIAL MENISCUS (ICD-717.2) Assessment New  separate identifiable x-ray report  3 views LEFT knee  Diagnosis pain  Overall her alignment is anatomic within 5-7 of valgus.  She has mild osteopenia.  She is mild symmetric joint space narrowing  Impression mild to moderate osteoarthritis LEFT knee  I suspect she tore her medial meniscus.  She is not having mechanical symptoms at this time and her symptoms have improved so I've advised her to do a home exercise program with the PEP pad #25 an 11 2 sets of 15 for 6 weeks.  If things seem to worsen then I would advise her to come back and see his  Orders: Est. Patient Level IV (16109) Knee x-ray,  3 views (60454)  Patient Instructions: 1)  Start exercises for the knee, do for 6 weeks 2)  take Tylenol as needed. 3)  Come back as needed   Orders Added: 1)  Est. Patient Level IV [09811] 2)  Knee x-ray,  3 views [73562]

## 2010-10-05 LAB — DIFFERENTIAL
Basophils Absolute: 0 10*3/uL (ref 0.0–0.1)
Basophils Relative: 0 % (ref 0–1)
Eosinophils Relative: 1 % (ref 0–5)
Lymphocytes Relative: 34 % (ref 12–46)
Monocytes Relative: 11 % (ref 3–12)
Neutro Abs: 4.5 10*3/uL (ref 1.7–7.7)

## 2010-10-05 LAB — CBC
HCT: 25.4 % — ABNORMAL LOW (ref 36.0–46.0)
MCV: 76.3 fL — ABNORMAL LOW (ref 78.0–100.0)
RBC: 3.33 MIL/uL — ABNORMAL LOW (ref 3.87–5.11)
WBC: 8.3 10*3/uL (ref 4.0–10.5)

## 2010-10-11 NOTE — Letter (Signed)
Summary: History form  History form   Imported By: Jacklynn Ganong 10/03/2010 12:30:51  _____________________________________________________________________  External Attachment:    Type:   Image     Comment:   External Document

## 2010-10-22 LAB — GLUCOSE, CAPILLARY: Glucose-Capillary: 115 mg/dL — ABNORMAL HIGH (ref 70–99)

## 2010-10-25 LAB — DIFFERENTIAL
Basophils Absolute: 0 10*3/uL (ref 0.0–0.1)
Lymphocytes Relative: 13 % (ref 12–46)
Monocytes Relative: 11 % (ref 3–12)
Neutro Abs: 10.7 10*3/uL — ABNORMAL HIGH (ref 1.7–7.7)
Neutrophils Relative %: 76 % (ref 43–77)

## 2010-10-25 LAB — URINALYSIS, ROUTINE W REFLEX MICROSCOPIC
Nitrite: NEGATIVE
Specific Gravity, Urine: 1.02 (ref 1.005–1.030)
pH: 5.5 (ref 5.0–8.0)

## 2010-10-25 LAB — BASIC METABOLIC PANEL
BUN: 8 mg/dL (ref 6–23)
GFR calc non Af Amer: >60 mL/min (ref >60)
Potassium: 3.6 mEq/L (ref 3.5–5.1)

## 2010-10-25 LAB — CBC
Hemoglobin: 8.9 g/dL — ABNORMAL LOW (ref 12.0–15.0)
MCHC: 33.6 g/dL (ref 30.0–36.0)
Platelets: 212 10*3/uL (ref 150–400)

## 2010-11-27 NOTE — Discharge Summary (Signed)
[name], [name]             ACCOUNT NO.:  [id number]   MEDICAL RECORD NO.:  [id number]          PATIENT TYPE:  INP   LOCATION:  A318                          FACILITY:  [facility]   PHYSICIAN:  Margaretmary Dys, M.D.DATE OF BIRTH:  06-26-1929   DATE OF ADMISSION:  01/15/2008  DATE OF DISCHARGE:  07/06/2009LH                               DISCHARGE SUMMARY   DISCHARGE DIAGNOSES:  1. Acute abdominal pain.  2. Acute colonic diverticulitis.  3. Acute diarrhea.   OTHER DIAGNOSES:  1. Hypertension.  2. Diabetes type 2, well-controlled.  3. Atrial fibrillation paroxysmal. The patient is now in sinus rhythm.  4. Dyslipidemia.   DISCHARGE MEDICATIONS:  1. Ciprofloxacin 500 mg p.o. b.i.d. for 10 days.  2. Flagyl 500 mg p.o. t.i.d. for 10 days.  3. Simvastatin 20 mg p.o. daily.  4. Metoprolol 25 mg p.o. b.i.d.  5. Vagifem twice a week.  6. Actonel 35 mg p.o. once a day.  7. Benicar 40/12.5 mg p.o. daily.   DIET:  Diabetic diet, heart-healthy diet.   ACTIVITY:  The patient is to increase activity slowly.   SPECIAL INSTRUCTIONS:  The patient advised to return to the emergency  room if severe abdominal pain recurs of if she develops nausea or  vomiting or fever.   FOLLOW UP:  The patient is to follow-up with Dr. Sherwood Gambler, her primary care  physician in about 2 weeks.   PERTINENT LABORATORY DATA:  On admission:  CT scan of the abdomen and  pelvis shows moderate diverticulitis involving the distal and sigmoid  colon with a very small 1.3 cm diverticular abscess noted.   HOSPITAL COURSE:  [name] is a 77 year old female who was admitted  to the hospital on January 15, 2008 due to acute abdominal pain.  She  reports the pain to be 10/10 when the pain started. It has been  lingering for about 10 days.  It started getting progressively worse and  waxing and waning.  She also developed some diarrhea.  Pain was mostly  in the peri-umbilical area extending into her left lower quadrant.   The  patient denies any fevers or chills.  No nausea or for vomiting.  A CT  scan done that confirmed the presence of acute diverticulitis with  abscess and the patient was started on Cipro and Flagyl intravenously.  The patient did very well and was switched to oral antibiotics after 2  days.   The patient was also given IV fluid hydration.  The patient did much  better and is subsequently being discharged home.  Pain had completely  resolved.  A follow-up CT scan done also showed improvement in the size  of the abscess and degree of diverticulitis.   CONDITION ON DISCHARGE:  Stable.   DISPOSITION:  To home.      Margaretmary Dys, M.D.  Electronically Signed     AM/MEDQ  D:  01/18/2008  T:  01/18/2008  Job:  [id number]

## 2010-11-27 NOTE — Group Therapy Note (Signed)
[name], [name]             ACCOUNT NO.:  [id number]   MEDICAL RECORD NO.:  [id number]          PATIENT TYPE:  INP   LOCATION:  A318                          FACILITY:  [facility]   PHYSICIAN:  Margaretmary Dys, M.D.DATE OF BIRTH:  [birth year]   DATE OF PROCEDURE:  01/17/2008  DATE OF DISCHARGE:                                 PROGRESS NOTE   SUBJECTIVE:  The patient feels better today.  She has much less  diarrhea.  Abdominal pain is also improved.  The patient is requesting  for her diet to be increased.  She is also ambulating in the hallway  without any difficulty.   OBJECTIVE:  Conscious, alert, comfortable, not in acute distress.  Well-  oriented in time, place and person.  VITAL SIGNS:  Her blood pressure is 128/52 with a pulse of 62,  respirations 20, temperature 98 degrees Fahrenheit, oxygen saturation is  97% on room air.  Blood sugars are ranging between 67-92.  HEENT EXAM:  Normocephalic, atraumatic.  Oral mucosa was moist with no  exudates.  NECK:  Supple.  No JVD, no lymphadenopathy.  LUNGS:  Clear clinically, good air entry bilaterally.  HEART:  S1, S2, regular.  No S3, S4, gallops or rubs.  ABDOMEN:  Soft, not tender.  Bowel sounds positive.  No masses palpable.  EXTREMITIES:  No pitting pedal edema.  CNS EXAM:  Grossly intact.   LABORATORY/DIAGNOSTIC DATA:  White blood cell count 5.9, hemoglobin 9.9,  hematocrit 28.4, platelet count was 302.  No left shift.  Sodium 138,  potassium 3.8, chloride 104, CO2 is 28, glucose of 94, BUN of 3,  creatinine was 0.73, calcium is 8.8.   ASSESSMENT AND PLAN:  1. Acute abdominal pain.  2. Acute colonic diverticulitis.  3. Diabetes mellitus type 2, well controlled.  4. History of atrial fibrillation.  The patient currently in sinus      rhythm.  5. Hypertension.  6. History of dyslipidemia.   PLAN:  1. The patient is doing very well.  Will continue on oral Cipro and      Flagyl.  2. Will advance her diet as  tolerated.  3. Will monitor the patient over the next 24 hours and likely may      discharge her home in the next 24-48 hours.      Margaretmary Dys, M.D.  Electronically Signed     AM/MEDQ  D:  01/17/2008  T:  01/17/2008  Job:  [id number]

## 2010-11-27 NOTE — Op Note (Signed)
NAME:  [name], [name]             ACCOUNT NO.:  [id number]   MEDICAL RECORD NO.:  [id number]          PATIENT TYPE:  AMB   LOCATION:  DAY                           FACILITY:  [facility]   PHYSICIAN:  R. Roetta Sessions, M.D. DATE OF BIRTH:  11-Apr-1929   DATE OF PROCEDURE:  12/15/2008  DATE OF DISCHARGE:                               OPERATIVE REPORT   INDICATIONS FOR PROCEDURE:  A 77 year old lady with CT documented  diverticulitis with abscess recently.  She has had three episodes;  however, prior colonoscopy demonstrated no diverticula.  Clinically she  is doing well now.  She has no abdominal pain.  She is having bowel  movements every day.  No fever.  Colonoscopy is now being done to  reassess her colon.  Risks, benefits, alternatives and limitations have  been discussed, questions answered.  Please see documentation in the  medical record.   PROCEDURE NOTE:  O2 saturation, blood pressure, pulse and respirations  were monitored throughout the entirety of the procedure.  Conscious  sedation Versed 4 mg IV, Demerol 50 mg IV in divided doses.  Instrument  Pentax video chip system.   FINDINGS:  Digital rectal exam revealed no abnormalities.  Endoscopic  findings:  The prep was good.  Colon:  The colonic mucosa was surveyed  from the rectosigmoid junction through the left transverse right colon  to the area of the appendiceal orifice, ileocecal valve and cecum.  These structures were well seen and photographed for the record.  From  this level scope was withdrawn.  All previously mentioned mucosal  surfaces were again seen.  The patient had few scattered, innocent-  appearing left-sided diverticula.  The colonic mucosa otherwise appeared  normal except for diminutive polyp at the base of the cecum which was  cold biopsy/removed.  The scope was pulled down in the rectum where a  thorough examination of the rectal mucosa including retroflexed view of  the anal verge demonstrated no  abnormalities.  The patient tolerated the  procedure well.  Cecal withdrawal time 7 minutes.   IMPRESSION:  1. Normal rectum.  2. Few scattered innocent-appearing left-sided diverticula; diminutive      cecal polyp status post cold biopsy removal.  Remainder of colonic      mucosa appeared normal.  3. Diverticula in the colon appeared innocent today.  I wonder if she      just did not have one episode of diverticulitis over the past 6      months or so.  Clinically, she is doing very well at this time, and      I would not advocate a surgical referral at this point.   RECOMMENDATIONS:  1. Begin Metamucil or other psyllium fiber supplement one dose daily      with adequate fluids.  2. Follow up on path.  3. I will plan to see this nice lady back in 6 months and see how she      is doing.      Jonathon Bellows, M.D.  Electronically Signed     RMR/MEDQ  D:  12/15/2008  T:  12/15/2008  Job:  [id number]

## 2010-11-27 NOTE — H&P (Signed)
NAME:  [name], [name]             ACCOUNT NO.:  [id number]   MEDICAL RECORD NO.:  [id number]          PATIENT TYPE:  AMB   LOCATION:  DAY                           FACILITY:  [facility]   PHYSICIAN:  Dalia Heading, M.D.  DATE OF BIRTH:  April 03, 1929   DATE OF ADMISSION:  DATE OF DISCHARGE:  LH                              HISTORY & PHYSICAL   CHIEF COMPLAINT:  Family history of colon carcinoma, anemia.   HISTORY OF PRESENT ILLNESS:  The patient is a 77 year old white female  who is referred for endoscopic evaluation.  She needs colonoscopy due to  a family history of colon carcinoma and anemia.  She had a colonoscopy  approximately 5 years ago which was unremarkable.  She was noted  recently to have anemia on a recent blood check.  No abdominal pain,  weight loss, nausea, vomiting, diarrhea, constipation, melena, or  hematochezia have been noted.   PAST MEDICAL HISTORY:  1. Arthritis.  2. Hypertension.   PAST SURGICAL HISTORY:  1. Foot surgeries.  2. C-section.  3. Cholecystectomy.  4. Thyroidectomy.  5. Cataract surgery.   CURRENT MEDICATIONS:  Actonel, simvastatin, glucosamine, calcium  supplements, metoprolol, fish oil.   ALLERGIES:  SULFA, NOVOCAINE, PENICILLIN.   REVIEW OF SYSTEMS:  Noncontributory.   PHYSICAL EXAMINATION:  GENERAL:  The patient is a well-developed, well-  nourished white female in no acute distress.  LUNGS:  Clear to auscultation with equal breath sounds bilaterally.  HEART:  Regular rate and rhythm without S3, S4, or murmurs.  ABDOMEN:  Soft, nontender, nondistended.  No hepatosplenomegaly or  masses are noted.  RECTAL:  Examination was deferred to the procedure.   IMPRESSION:  Family history of colon carcinoma, anemia.   PLAN:  The patient is scheduled for colonoscopy on November 03, 2007.  The  risks and benefits of the procedure including bleeding and perforation  were fully explained to the patient who gave informed consent.      Dalia Heading, M.D.  Electronically Signed     MAJ/MEDQ  D:  10/15/2007  T:  10/15/2007  Job:  [id number]   cc:   Short-stay at [facility]. Sherwood Gambler, MD  Fax: [phone]

## 2010-11-27 NOTE — Group Therapy Note (Signed)
[name], [name]             ACCOUNT NO.:  [id number]   MEDICAL RECORD NO.:  [id number]          PATIENT TYPE:  INP   LOCATION:  A318                          FACILITY:  [facility]   PHYSICIAN:  Margaretmary Dys, M.D.DATE OF BIRTH:  1928/10/22   DATE OF PROCEDURE:  01/16/2008  DATE OF DISCHARGE:                                 PROGRESS NOTE   SUBJECTIVE:  The patient feels better today.  The patient describes the  pain as much better, now down to 1/10.  She had 2 diarrhea episodes this  morning, which appeared to have exacerbated the pain.  She has no fevers  or chills.  No nausea.   OBJECTIVE:  Conscious, alert, comfortable, not in acute distress, well  oriented in time, place and person.  VITAL SIGNS:  Blood pressure was 127/69 with a pulse of 72, respirations  20, temperature 98.5 degrees Fahrenheit, oxygen saturation is 98% on  room air.  Blood sugars are ranging between 67-109.  HEENT:  Normocephalic, atraumatic.  Oral mucosa was moist with no  exudates.  NECK:  Supple.  No JVD or lymphadenopathy.  LUNGS:  Clear clinically.  Good air entry bilaterally.  HEART:  S1 and S2 regular, no S3, S4, gallops, or rubs.  ABDOMEN:  Soft with some very mild tenderness in the left lower  quadrant.  There was no distention.  No guarding or rigidity was noted.  Bowel sounds were present.  EXTREMITIES:  No pitting pedal edema, no calf induration or tenderness.  CNS:  Grossly intact with no focal neurological deficits.   LABORATORY/DIAGNOSTIC DATA:  PT is 14, INR 1.0.  Sodium 140, potassium  3.9, chloride of 105.  CO2 is 29, BUN of 6, creatinine 0.62, glucose is  95, AST 23, ALT of 21, albumin 3.2.  Cardiac enzymes were negative.  Blood cultures are negative thus far.   ASSESSMENT AND PLAN:  1. Acute colonic diverticulitis, first episode.  2. History of hypertension.  3. Diabetes mellitus type 2, well-controlled.  4. History of atrial fibrillation.  5. History of dyslipidemia.    PLAN:  1. The patient is doing better.  Will continue on clear liquid diet      and will switch to oral antibiotics with p.o. Cipro and Flagyl.  2. Will continue all other home medications at this time.  3. Requested surgical consult.  We appreciate Dr. Illene Regulus input.  He      has recommended continued current conservative management and      possibly advancing her diet tomorrow.   DISPOSITION:  The patient will be monitored in the next 24-48 hours and  will likely be discharged home on oral antibiotics, depending on how she  does.  It is noted followup CT scan, obtained last night, showed improvement in  the diverticular abscess.      Margaretmary Dys, M.D.  Electronically Signed     AM/MEDQ  D:  01/16/2008  T:  01/16/2008  Job:  [id number]

## 2010-11-27 NOTE — H&P (Signed)
NAME:  [name], [name]             ACCOUNT NO.:  [id number]   MEDICAL RECORD NO.:  [id number]          PATIENT TYPE:  EMS   LOCATION:  ED                            FACILITY:  [facility]   PHYSICIAN:  Lucita Ferrara, MD         DATE OF BIRTH:  Jan 01, 1929   DATE OF ADMISSION:  01/15/2008  DATE OF DISCHARGE:  LH                              HISTORY & PHYSICAL   PRIMARY CARE DOCTOR:  Dr. Sherwood Gambler.   CHIEF COMPLAINT:  Severe abdominal pain.   HISTORY OF PRESENT ILLNESS:  The patient is a 77 year old who presents  here with severe abdominal pain that started earlier today.  She was  seen by Dr. Sherwood Gambler Monday.  She had a CT scan of the abdomen, which  showed diverticulitis and possible micro perforation.  Dr. Sherwood Gambler,  therefore, sent the patient for admission.  Pain has actually been  lingering now for 10 days and getting progressively worse.  Waxing and  waning.  She denies any hematochezia.  Pain is 4/10 in intensity.  She  denies any nausea, vomiting, hematemesis, fevers, chills.   Otherwise 12 review of systems negative.   PAST MEDICAL HISTORY:  1. Hypertension.  2. Diabetes.  3. Atrial fibrillation.  4. Hyperlipidemia.   PAST SURGICAL HISTORY:  1. Status post cholecystectomy  2. Status post cesarean section.   SOCIAL HISTORY:  She denies drugs alcohol or tobacco.  She denies any  recent travel or sick contacts.  She has had colonoscopy apparently was  scheduled back in April 2009.  Cannot find the results here in the Chart  Max.   ALLERGIES:  She is allergic to SULFA, PENICILLIN and NOVOCAINE.   MEDICATIONS AT HOME:  1. Actonel 35 mg p.o.  2. Benicar 40/12.5 mg p.o. daily.  3. Simvastatin 20 mg p.o. daily.  4. Metoprolol 25 mg b.i.d.  5. Vagifem twice a week.   Note, medication frequency and doses not verified until medication  reconciliation form filled out by nursing staff.   PHYSICAL EXAMINATION:  GENERAL:  The patient is in no acute distress.  She is quite uncomfortable,  however.  VITAL SIGNS:  Blood pressure is 162/64, pulse 65, respirations 16,  temperature 97.2.  HEENT:  Normocephalic, atraumatic.  Sclerae anicteric.  NECK:  Supple.  No JVD or carotid bruits.  PERLA.  Extraocular muscles  intact.  CARDIOVASCULAR:  S1, S2.  Regular rate and rhythm.  No murmurs, rubs or  clicks.  ABDOMEN:  Soft.  There is right lower quadrant tenderness to light  palpation.  Positive bowel sounds.  No guarding.  No hepatosplenomegaly.  NEURO:  Patient is oriented x3.  Cranial nerves 2-12 grossly intact.   REVIEW OF THE LABORATORY RESULTS:  CT scan of the abdomen and pelvis  July 2 shows moderate diverticulitis involving the distal and sigmoid  colon, tiny 1.3 cm diverticular abscess also noted.   ASSESSMENT:  A 77 year old with:  1. Abdominal pain, seen by Dr. Sherwood Gambler 5 days prior.  She has apparently      had abdominal pain for 10 days.  She was diagnosis  diverticulitis      and a tiny 1.3 cm fluid collection and abscess in the sigmoid      colon; diverticulitis with complications.  2. Diabetes type 2/hypertension.  3. History of paroxysmal atrial fibrillation, currently normal sinus      rhythm.  Note, this is per documentation.   PLAN:  We will go ahead and admit the patient to medical telemetry unit,  bowel rest, IV fluids, IV antiemetics, metronidazole, ciprofloxacin IV.  Blood cultures, UA, urine culture, repeat CT scan of the abdomen with  contrast media to assess degree of fluid collection.  If worse, the  patient will need interventional radiology, ultrasound versus CT-guided  drainage, unless otherwise recommended by surgery.  Will go ahead and  proceed with surgery consultation for further recommendations.  She is  clinically stable and does not have a surgical abdomen at this point.  The patient can be seen early in the morning.  The rest of plans are  dependent on her progress.  DVT and GI prophylaxis.      Lucita Ferrara, MD  Electronically  Signed     RR/MEDQ  D:  01/15/2008  T:  01/15/2008  Job:  [id number]   cc:   Madelin Rear. Sherwood Gambler, MD  Fax: [phone]

## 2010-11-27 NOTE — Consult Note (Signed)
[name], [name]             ACCOUNT NO.:  [id number]   MEDICAL RECORD NO.:  [id number]          PATIENT TYPE:  INP   LOCATION:  A318                          FACILITY:  [facility]   PHYSICIAN:  Tilford Pillar, MD      DATE OF BIRTH:  Mar 21, 1929   DATE OF CONSULTATION:  01/16/2008  DATE OF DISCHARGE:                                 CONSULTATION   REASON FOR CONSULTATION:  Abdominal pain.   HISTORY OF PRESENT ILLNESS:  The patient is a 77 year old female with a  history of hypertension, atrial fibrillation, diabetes mellitus who  presents with approximately 10 days of lower abdominal pain.  She denies  any previous similar symptomatology in the past.  She has had no fever  or chills.  No nausea or vomiting.  The pain is fairly persistent,  described as colicky, 4/10 pain in both lower quadrants.  At this point  she does feel better having been admitted and started on IV antibiotics  and IV fluids.  She has had no diarrhea.  No melena.  No hematochezia.  She has had a recent colonoscopy by my partner, Dr. Lovell Sheehan, in April of  this year which was reported as normal by the patient.   PAST MEDICAL HISTORY:  1. Hypertension.  2. Diabetes mellitus type 2  3. Atrial fibrillation.  4. Hyperlipidemia.   PAST SURGICAL HISTORY:  Cholecystectomy and cesarean section.   MEDICATIONS:  Actonel, Benicar, simvastatin, metoprolol, Vagifem.   ALLERGIES:  She is allergic to SULFA, PENICILLIN, NOVOCAIN.   SOCIAL HISTORY:  No tobacco.  No alcohol.  No recreational drug use.   PHYSICAL EXAMINATION:  VITAL SIGNS:  Temperature 97.2, heart rate 66,  respirations 16, blood pressure 152/54.  She is 100% oxygen saturation  on room air.  GENERAL:  She is not in any acute distress.  She is lying in supine  position in her hospital bed.  She is alert and oriented x3.  HEENT:  Pupils are equal, round and reactive.  Extraocular movements are  intact.  No scleral icterus or conjunctival pallor is noted.   Trachea is  midline.  No cervical lymphadenopathy.  PULMONARY:  Unlabored respirations.  She is clear to auscultation  bilaterally.  CARDIOVASCULAR:  Regular rate and rhythm.  ABDOMEN:  Decreased bowel sounds.  Abdomen is soft.  Mild tenderness to  the lower quadrants.  No peritoneal signs.  She is flat, nondistended.  No masses, no hernias are appreciated.  EXTREMITIES:  Warm and dry.   PERTINENT LABORATORY AND RADIOGRAPHIC STUDIES:  CBC - white blood count  was 9.9.  Basic metabolic panel was within normal limits.  CT of the  abdomen and pelvis does demonstrate some inflammatory changes of the  sigmoid colon consistent with sigmoid diverticulitis.  There is no  evidence of any free fluid or intra-abdominal free air.   ASSESSMENT AND PLAN:  Acute diverticulitis.  At this point I would  recommend continuing IV antibiotics as well as continuing the IV fluid  hydration.  She has already been started on a clear liquid diet which  she states she is tolerating  well.  I would continue with this as long  as her pain remains controlled and as long as she does not have any  increasing abdominal pain or increase in the white blood cell count with  the fluid.  If she continues to tolerate the clear liquid diet in the  next 24 hours I would then at that point start to advance her diet as  tolerated.  She does not have any acute surgical indications.  This does  appear to be her first episode of diverticulitis and with that I would  continue with the conservative management.   I appreciate the opportunity to participate in this patient's care.  Will continue to follow the patient with you on her hospital admission.      Tilford Pillar, MD  Electronically Signed     BZ/MEDQ  D:  01/16/2008  T:  01/16/2008  Job:  [id number]   cc:   [facility] team

## 2010-11-30 NOTE — Op Note (Signed)
NAME:  [name], [name]                       ACCOUNT NO.:  [id number]   MEDICAL RECORD NO.:  [id number]                   PATIENT TYPE:  AMB   LOCATION:  DAY                                  FACILITY:  [facility]   PHYSICIAN:  R. Roetta Sessions, M.D.              DATE OF BIRTH:  [birth year]   DATE OF PROCEDURE:  04/20/2003  DATE OF DISCHARGE:                                 OPERATIVE REPORT   PROCEDURE:  Incomplete colonoscopy, screening, high risk/diagnostic iron  deficiency anemia.   INDICATIONS FOR PROCEDURE:  The patient is a 77 year old lady with a  positive family history of colorectal carcinoma, iron deficiency anemia who  is not known to be GI bleeding.  She has never had her colon examined  previously.  She now comes for screening/diagnostic colonoscopy.  This  approach has been discussed with the patient previously and again today at  the bedside.  The potential risks, benefits, and alternatives have been  reviewed and questions answered.  Please see the documentation in the  medical record.   PROCEDURE:  O2 saturation, blood pressure, pulses, and respirations were  monitored throughout the entire procedure.  Conscious sedation was with  Versed 5 mg IV, Demerol 75 mg IV in divided doses.  The instrument used was  the Olympus video chip adult colonoscope.   FINDINGS:  Digital rectal examination revealed no abnormalities.   ENDOSCOPIC FINDINGS:  The prep was good.   Rectum:  Examination of the rectal mucosa including retroflex view of the  anal verge revealed only minimal internal hemorrhoids.   Colon:  The colonic mucosa was surveyed from the rectosigmoid junction  through the left and well into the mid-colon.  This colon was quite  redundant.  I encountered recurrent looping, and in spite of changing of the  patient's position and external abdominal pressure in multiple combinations,  I was unable to reach the cecum.  In addition, the patient was quite  uncomfortable  throughout the procedure, even though looping was kept at a  minimum.  The proximal extent of the colon visualized is not known because  the cecum was not visualized.  The left colonic mucosa certainly appeared  normal.  The patient overall tolerated the procedure well, although, again,  she was uncomfortable during the procedure in spite of fairly hefty doses of  Versed and Demerol for an elderly female.  The patient was reactive in  endoscopy.   IMPRESSION:  1. Normal rectum, aside from minimal internal hemorrhoids.  2. Left colonic mucosa appeared normal.  Redundancy and looping precluded     advancement of the scope to the cecum.    RECOMMENDATIONS:  Will complement today's procedure with an air contrast  barium enema to image her more proximal colon not seen today.  Further  recommendations to follow.      ___________________________________________  Jonathon Bellows, M.D.   RMR/MEDQ  D:  04/20/2003  T:  04/20/2003  Job:  [id number]   cc:   Patrica Duel, M.D.  [address]  [place]  [place] [address]  Fax: [phone]

## 2011-01-10 ENCOUNTER — Ambulatory Visit (INDEPENDENT_AMBULATORY_CARE_PROVIDER_SITE_OTHER): Payer: Medicare Other | Admitting: Urgent Care

## 2011-01-10 ENCOUNTER — Other Ambulatory Visit: Payer: Self-pay | Admitting: Urgent Care

## 2011-01-10 ENCOUNTER — Encounter: Payer: Self-pay | Admitting: Urgent Care

## 2011-01-10 DIAGNOSIS — K5732 Diverticulitis of large intestine without perforation or abscess without bleeding: Secondary | ICD-10-CM

## 2011-01-10 DIAGNOSIS — R109 Unspecified abdominal pain: Secondary | ICD-10-CM | POA: Insufficient documentation

## 2011-01-10 LAB — URINALYSIS, MICROSCOPIC ONLY
Casts: 0
Crystals: 0

## 2011-01-10 LAB — COMPREHENSIVE METABOLIC PANEL
ALT: 16 U/L (ref 0–35)
Alkaline Phosphatase: 63 U/L (ref 39–117)
CO2: 31 mEq/L (ref 19–32)
Sodium: 133 mEq/L — ABNORMAL LOW (ref 135–145)
Total Bilirubin: 1.3 mg/dL — ABNORMAL HIGH (ref 0.3–1.2)
Total Protein: 7.4 g/dL (ref 6.0–8.3)

## 2011-01-10 LAB — CBC WITH DIFFERENTIAL/PLATELET
Eosinophils Absolute: 0.1 10*3/uL (ref 0.0–0.7)
Lymphs Abs: 2.7 10*3/uL (ref 0.7–4.0)
MCH: 21 pg — ABNORMAL LOW (ref 26.0–34.0)
Neutro Abs: 10.2 10*3/uL — ABNORMAL HIGH (ref 1.7–7.7)
Neutrophils Relative %: 69 % (ref 43–77)
Platelets: 230 10*3/uL (ref 150–400)
RBC: 5.18 MIL/uL — ABNORMAL HIGH (ref 3.87–5.11)
WBC: 14.8 10*3/uL — ABNORMAL HIGH (ref 4.0–10.5)

## 2011-01-10 LAB — URINALYSIS, ROUTINE W REFLEX MICROSCOPIC
Bilirubin Urine: NEGATIVE
Glucose, UA: NEGATIVE mg/dL
Protein, ur: NEGATIVE mg/dL

## 2011-01-10 LAB — BILIRUBIN, FRACTIONATED(TOT/DIR/INDIR)
Bilirubin, Direct: 0.2 mg/dL (ref 0.0–0.3)
Indirect Bilirubin: 1.1 mg/dL — ABNORMAL HIGH (ref 0.0–0.9)

## 2011-01-10 MED ORDER — METRONIDAZOLE 500 MG PO TABS: 500.0000 mg | ORAL_TABLET | Freq: Three times a day (TID) | ORAL | Status: AC

## 2011-01-10 MED ORDER — CIPROFLOXACIN HCL 500 MG PO TABS: 500.0000 mg | ORAL_TABLET | Freq: Two times a day (BID) | ORAL | Status: AC

## 2011-01-10 NOTE — Patient Instructions (Signed)
Clear liquids x 24-48 hrs, then gradually advance to a bland diet Call or go to ER if severe pain  Clear Liquid Diet Your caregiver wants you to be on a clear liquid diet until your condition gets better. If you have been vomiting, do not eat or drink anything for at least one hour. Then start with ice chips and small sips of water. If you can keep the water down, then you may have any of the following:  Those foods that are liquid or will become liquid at body temperature.   Liquids you can see through.   Vitamin water, Non-carbonated soft drinks with no caffeine.   Fruit juices (apple and grape are good).   Clear broth, soups, or bouillon.   Clear gelatin desserts (jello) or popsicles.  Things to avoid:  Caffeinated drinks.   Carbonated drinks.   Dairy products.   Solid foods   Meat.   Other fatty foods.  SPECIAL NOTES This diet is very restrictive, providing some electrolytes and a small amount of calories. Use should be limited to very short periods, and only under the advice or supervision of your physician or dietitian. CONTACT YOUR CAREGIVER:  For follow-up as directed   If you are not getting better or are getting worse.   If you are unable to keep anything down, including clear liquids.   If you have any other questions or concerns.  Document Released: 08/08/2004 Document Re-Released: 09/27/2008 [facility] Patient Information 2011 [place], [place].

## 2011-01-10 NOTE — Progress Notes (Signed)
Referring Provider: Cassell Smiles., MD Primary Care Physician:  Cassell Smiles., MD Primary Gastroenterologist:  Dr. Jena Gauss  Chief Complaint  Patient presents with  . Abdominal Pain    HPI:  [name] is a 77 y.o. female here for abd pain.  Hx 2 episodes of diverticulitis as below.  5 days ago on Saturday, she ate a can of beans that caused bloating .  2 nights ago, developed "horrible" pain @ umbilicus.  Worse w/ BM & urination.  Denies freq urination.  BM BID.  Denies diarrhea, constipation, rectal bleeding, mucus or melena.  Pain 0/10 now, but with movement 6/10.  "Scared to eat"  +chills, wrapping up in blanket to keep warm.  Has not had fever that she know.  Denies nausea or vomiting.  Wt stable.  C/o anorexia.  Both previous episodes of diverticulitis responded to antibiotics.    Past Medical History  Diagnosis Date  . Diverticulitis 8/09, 3/10    abscess in 2009(medical tx)  . IDA (iron deficiency anemia)   . Tubular adenoma of colon 12/15/08    Colonosocpy Dr Angelene Giovanni removed cecum  . Thalassemia   . Atrial fibrillation   . Hyperthyroidism     s/p radiation, surgery  . Diabetes mellitus   . HTN (hypertension)   . Osteoporosis   . Hypercholesteremia     Past Surgical History  Procedure Date  . Cholecystectomy   . Cesarean section     x2  . Thyroidectomy, partial   . Foot surgery     bilat    Current Outpatient Prescriptions  Medication Sig Dispense Refill  . amitriptyline (ELAVIL) 10 MG tablet Take 10 mg by mouth at bedtime.        . Calcium Carbonate-Vitamin D (CALCIUM + D PO) Take by mouth 2 (two) times daily.        . Coenzyme Q10 (CO Q-10) 100 MG CAPS Take by mouth.        . estradiol (VAGIFEM) 25 MCG vaginal tablet Place 25 mcg vaginally 2 (two) times a week.        . fish oil-omega-3 fatty acids 1000 MG capsule Take 2 g by mouth daily.        . Glucosamine-Chondroit-Vit C-Mn (GLUCOSAMINE 1500 COMPLEX PO) Take by mouth.        . metoprolol  tartrate (LOPRESSOR) 25 MG tablet Take 25 mg by mouth 2 (two) times daily. 1/2 pill  Bid        . olmesartan-hydrochlorothiazide (BENICAR HCT) 40-12.5 MG per tablet Take 1 tablet by mouth daily.        . risedronate (ACTONEL) 35 MG tablet Take 35 mg by mouth every 7 (seven) days. with water on empty stomach, nothing by mouth or lie down for next 30 minutes.       . simvastatin (ZOCOR) 40 MG tablet Take 40 mg by mouth at bedtime.        . ciprofloxacin (CIPRO) 500 MG tablet Take 1 tablet (500 mg total) by mouth 2 (two) times daily.  20 tablet  0  . metroNIDAZOLE (FLAGYL) 500 MG tablet Take 1 tablet (500 mg total) by mouth 3 (three) times daily.  30 tablet  0    Allergies as of 01/10/2011 - Review Complete 01/10/2011  Allergen Reaction Noted  . Procaine hcl Other (See Comments)   . Sulfonamide derivatives Other (See Comments)   . Penicillins Rash     Family History  Problem Relation Age of Onset  .  Pneumonia Father   . Coronary artery disease Mother   . Stroke Mother   . Thalassemia Sister   . Colon cancer Mother 44    History   Social History  . Marital Status: Widowed    Spouse Name: N/A    Number of Children: 4  . Years of Education: 10th grade   Occupational History  . house wife     Social History Main Topics  . Smoking status: Never Smoker   . Smokeless tobacco: Not on file  . Alcohol Use: Yes     occasionally   . Drug Use: No  . Sexually Active: Not on file   Other Topics Concern  . Not on file   Social History Narrative   Lost 1 son-mental illness, htn age 61   Review of Systems: Gen: +fatigue/malaise CV: Denies chest pain, angina, palpitations, syncope, orthopnea, PND, peripheral edema, and claudication. Resp: Denies dyspnea at rest, dyspnea with exercise, cough, sputum, wheezing, coughing up blood, and pleurisy. GI: Denies vomiting blood, jaundice, and fecal incontinence.   Denies dysphagia or odynophagia. Derm: Denies rash, itching, dry skin, hives,  moles, warts, or unhealing ulcers.  Psych: Denies depression, anxiety, memory loss, suicidal ideation, hallucinations, paranoia, and confusion. Heme: Denies bruising, bleeding, and enlarged lymph nodes.  Physical Exam: BP 135/77  Pulse 83  Temp(Src) 97.4 F (36.3 C) (Temporal)  Ht 5\' 1"  (1.549 m)  Wt 128 lb (58.06 kg)  BMI 24.19 kg/m2 General:   Alert,  Well-developed, well-nourished, pleasant and cooperative in NAD Head:  Normocephalic and atraumatic. Eyes:  Sclera clear, no icterus.   Conjunctiva pink. Mouth:  No deformity or lesions, dentition normal. Neck:  Supple; no masses or thyromegaly. Heart:  Regular rate and rhythm; no murmurs, clicks, rubs,  or gallops. Abdomen:  Soft and mildly distended.  Mildly TTP at LLQ, umbilicus & RLQ.  No masses, hepatosplenomegaly or hernias noted. Normal bowel sounds, without guarding, and without rebound.   Msk:  Symmetrical without gross deformities. Normal posture. Pulses:  Normal pulses noted. Extremities:  Without clubbing or edema. Neurologic:  Alert and  oriented x4;  grossly normal neurologically. Skin:  Intact without significant lesions or rashes. Cervical Nodes:  No significant cervical adenopathy. Psych:  Alert and cooperative. Normal mood and affect.

## 2011-01-10 NOTE — Assessment & Plan Note (Addendum)
[name] is a 77 y.o. caucasian female w/ sever abd pain & bloating.  I suspect recurrent diverticulitis.  Will check UA r/o UTI & CBC.  Lipase to r/o pancreatitis.  If diverticulitis, this would be 3rd episode & may consider surgical referral.  Will discuss w/ Dr Jena Gauss.   Offered pain meds. Pt declined.

## 2011-01-11 ENCOUNTER — Encounter: Payer: Self-pay | Admitting: Urgent Care

## 2011-01-11 NOTE — Assessment & Plan Note (Addendum)
Suspect 3rd episode.  See abd pain. Cipro 500mg  BID x 10 days Flagyl 500mg  TID x 10 days Clear liquids x 24-48 hrs, then bland diet. TO ER IF SEVERE PAIN. We will call w/ lab results & recommendations.

## 2011-01-11 NOTE — Progress Notes (Signed)
AGREE

## 2011-01-14 ENCOUNTER — Emergency Department (HOSPITAL_COMMUNITY)
Admission: EM | Admit: 2011-01-14 | Discharge: 2011-01-15 | Disposition: A | Discharge status: Home / Self Care | Payer: Medicare Other | Attending: Emergency Medicine | Admitting: Emergency Medicine

## 2011-01-14 DIAGNOSIS — E785 Hyperlipidemia, unspecified: Secondary | ICD-10-CM | POA: Insufficient documentation

## 2011-01-14 DIAGNOSIS — R5383 Other fatigue: Secondary | ICD-10-CM | POA: Insufficient documentation

## 2011-01-14 DIAGNOSIS — I10 Essential (primary) hypertension: Secondary | ICD-10-CM | POA: Insufficient documentation

## 2011-01-14 DIAGNOSIS — E876 Hypokalemia: Secondary | ICD-10-CM | POA: Insufficient documentation

## 2011-01-14 DIAGNOSIS — E871 Hypo-osmolality and hyponatremia: Secondary | ICD-10-CM | POA: Insufficient documentation

## 2011-01-14 DIAGNOSIS — R5381 Other malaise: Secondary | ICD-10-CM | POA: Insufficient documentation

## 2011-01-14 DIAGNOSIS — E119 Type 2 diabetes mellitus without complications: Secondary | ICD-10-CM | POA: Insufficient documentation

## 2011-01-14 DIAGNOSIS — Z79899 Other long term (current) drug therapy: Secondary | ICD-10-CM | POA: Insufficient documentation

## 2011-01-14 LAB — COMPREHENSIVE METABOLIC PANEL
AST: 29 U/L (ref 0–37)
Albumin: 4 g/dL (ref 3.5–5.2)
Chloride: 91 mEq/L — ABNORMAL LOW (ref 96–112)
Creatinine, Ser: 0.62 mg/dL (ref 0.50–1.10)
Total Bilirubin: 0.6 mg/dL (ref 0.3–1.2)
Total Protein: 6.8 g/dL (ref 6.0–8.3)

## 2011-01-14 LAB — URINE MICROSCOPIC-ADD ON

## 2011-01-14 LAB — DIFFERENTIAL
Eosinophils Absolute: 0.1 10*3/uL (ref 0.0–0.7)
Lymphocytes Relative: 39 % (ref 12–46)
Monocytes Absolute: 0.7 10*3/uL (ref 0.1–1.0)
Neutrophils Relative %: 46 % (ref 43–77)

## 2011-01-14 LAB — CBC
MCV: 69.6 fL — ABNORMAL LOW (ref 78.0–100.0)
Platelets: 237 10*3/uL (ref 150–400)
RDW: 18 % — ABNORMAL HIGH (ref 11.5–15.5)
WBC: 5.7 10*3/uL (ref 4.0–10.5)

## 2011-01-14 LAB — URINALYSIS, ROUTINE W REFLEX MICROSCOPIC
Glucose, UA: NEGATIVE mg/dL
Hgb urine dipstick: NEGATIVE
Ketones, ur: NEGATIVE mg/dL
pH: 5.5 (ref 5.0–8.0)

## 2011-01-14 NOTE — Progress Notes (Signed)
Cc to Dr. Fusco 

## 2011-01-18 ENCOUNTER — Emergency Department (HOSPITAL_COMMUNITY)
Admission: EM | Admit: 2011-01-18 | Discharge: 2011-01-19 | Disposition: A | Discharge status: Home / Self Care | Payer: Medicare Other | Attending: Emergency Medicine | Admitting: Emergency Medicine

## 2011-01-18 DIAGNOSIS — E7800 Pure hypercholesterolemia, unspecified: Secondary | ICD-10-CM | POA: Insufficient documentation

## 2011-01-18 DIAGNOSIS — I10 Essential (primary) hypertension: Secondary | ICD-10-CM | POA: Insufficient documentation

## 2011-01-18 DIAGNOSIS — Z79899 Other long term (current) drug therapy: Secondary | ICD-10-CM | POA: Insufficient documentation

## 2011-01-18 LAB — BASIC METABOLIC PANEL
BUN: 12 mg/dL (ref 6–23)
CO2: 31 mEq/L (ref 19–32)
Calcium: 9.7 mg/dL (ref 8.4–10.5)
Creatinine, Ser: 0.62 mg/dL (ref 0.50–1.10)
GFR calc non Af Amer: >60 mL/min (ref >60)
Glucose, Bld: 131 mg/dL — ABNORMAL HIGH (ref 70–99)
Sodium: 138 mEq/L (ref 135–145)

## 2011-01-18 LAB — URINALYSIS, ROUTINE W REFLEX MICROSCOPIC
Glucose, UA: NEGATIVE mg/dL
Hgb urine dipstick: NEGATIVE
Specific Gravity, Urine: <1.005 — ABNORMAL LOW (ref 1.005–1.030)
Urobilinogen, UA: 0.2 mg/dL (ref 0.0–1.0)
pH: 5.5 (ref 5.0–8.0)

## 2011-01-19 LAB — DIFFERENTIAL
Basophils Absolute: 0 10*3/uL (ref 0.0–0.1)
Basophils Relative: 0 % (ref 0–1)
Lymphocytes Relative: 35 % (ref 12–46)
Lymphs Abs: 2.9 10*3/uL (ref 0.7–4.0)
Monocytes Relative: 15 % — ABNORMAL HIGH (ref 3–12)
Neutro Abs: 4 10*3/uL (ref 1.7–7.7)
Neutrophils Relative %: 49 % (ref 43–77)

## 2011-01-19 LAB — CBC
HCT: 30.6 % — ABNORMAL LOW (ref 36.0–46.0)
Hemoglobin: 10.9 g/dL — ABNORMAL LOW (ref 12.0–15.0)
MCH: 23.9 pg — ABNORMAL LOW (ref 26.0–34.0)
RBC: 4.56 MIL/uL (ref 3.87–5.11)

## 2011-01-25 NOTE — Progress Notes (Signed)
Please call pt [name] would like pt to see Vanessa Lovell Sheehan given hx recurrent diverticulitis to consider surgery Thanks

## 2011-01-28 ENCOUNTER — Telehealth: Payer: Self-pay

## 2011-01-28 NOTE — Progress Notes (Signed)
Pt informed. Said she has seen him before, and she will just call him herself.

## 2011-01-28 NOTE — Telephone Encounter (Signed)
Pt's son Tuleen Mandelbaum called. Said his Mom called him and said she was to see surgeon for recurring diverticulitis. He said she is actually doing very well at this time. So he will just let her make that decision. He said to be sure and document that she needs someone else to be made aware of any medical problems since she is very forgetful now. He said his sister usually comes with her to appts. I told him that I would make the note for her chart.

## 2011-03-07 ENCOUNTER — Ambulatory Visit (HOSPITAL_BASED_OUTPATIENT_CLINIC_OR_DEPARTMENT_OTHER)
Admission: RE | Admit: 2011-03-07 | Discharge: 2011-03-07 | Disposition: A | Discharge status: Home / Self Care | Payer: Medicare Other | Source: Ambulatory Visit | Attending: Orthopedic Surgery | Admitting: Orthopedic Surgery

## 2011-03-07 DIAGNOSIS — Z79899 Other long term (current) drug therapy: Secondary | ICD-10-CM | POA: Insufficient documentation

## 2011-03-07 DIAGNOSIS — Z01812 Encounter for preprocedural laboratory examination: Secondary | ICD-10-CM | POA: Insufficient documentation

## 2011-03-07 DIAGNOSIS — I10 Essential (primary) hypertension: Secondary | ICD-10-CM | POA: Insufficient documentation

## 2011-03-07 DIAGNOSIS — M249 Joint derangement, unspecified: Secondary | ICD-10-CM | POA: Insufficient documentation

## 2011-03-07 LAB — POCT I-STAT, CHEM 8
BUN: 9 mg/dL (ref 6–23)
Creatinine, Ser: 0.7 mg/dL (ref 0.50–1.10)
Sodium: 147 mEq/L — ABNORMAL HIGH (ref 135–145)
TCO2: 29 mmol/L (ref 0–100)

## 2011-03-12 NOTE — Op Note (Signed)
NAMESTEPHENY, CANAL             ACCOUNT NO.:  [id number]  MEDICAL RECORD NO.:  [id number]  LOCATION:                                 FACILITY:  PHYSICIAN:  Katy Fitch. Aariah Godette, M.D. 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  The extensor carpi ulnaris and the common extensors in the fourth dorsal compartment were not in jeopardy.  OPERATIONS: 1. Resection of right ulnar head with debridement of distal radioulnar     joint. 2. Arthroplasty of distal radioulnar joint with reconstruction of     dorsal capsule and dorsal radioulnar ligament with retinacular     imbrication and reinforcement. 3. Autogenous tendon graft reconstruction of extensor digiti minimi in     subcutaneous position. 4. Recreation of a retinacular sling for the extensor digiti minimi     using a reverse flap of the extensor retinaculum based on the 4-5     intracompartmental septum. 5. Incidental neurectomy of minor branch of the dorsal ulnar sensory     branch due to mechanical irritation with tendon transfer in     subcutaneous region.  OPERATING SURGEON:  Katy Fitch. Lana Flaim, MD  ASSISTANT:  Nurse.  ANESTHESIA:  Ropivacaine plexus block.  SUPERVISING ANESTHESIOLOGIST:  Kaylyn Layer. Michelle Piper, MD  INDICATIONS:  [name] is an 77 year old woman referred  through the courtesy of Dr. Assunta Found of [facility] for management of arthritis of her wrist and concerns about possible extensor tendon rupture.  Approximately 8 months ago, she presented for evaluation of a painful wrist.  We identified very significant discomfort at the distal radioulnar joint and x-rays revealed a nonunion of an ulnar styloid fracture and profound osteophyte formation at the ulnar head.  At that time, Ms. Pittinger had complete extension of all her fingers. However, I pointed out to her that she was at very significant risk to develop an extensor lag and rupture of her extensor digiti minimi and other finger extensors due to a high risk of Vaughan-Jackson syndrome.  Indeed, in August, she contacted me by phone stating that she had lost the ability to extend her small finger.  She was seen on an urgent basis immediately and was noted to have Vaughan-Jackson syndrome with rupture of the extensor digiti minimi.  She was scheduled for immediate exploration, ulnar head resection and extensor reconstruction.  She was noted have a palmaris longus present preoperatively.  DESCRIPTION OF PROCEDURE:  [name] was interviewed by Dr. Michelle Piper in the holding area and after informed consent had a ropivacaine plexus block placed.  Excellent anesthesia of the right upper extremity was achieved.  Due to some concerns about mild memory impairment,  general anesthesia was deferred.  She was brought to room #1, placed in supine position on the operating table and, under Dr. Deirdre Priest direct supervision, sedation was provided.  Ancef 1 gram was administered as IV prophylactic antibiotic without untoward result.  The right arm was prepped with Betadine soap and solution and sterilely draped.  A pneumatic tourniquet was applied to the proximal right brachium.  Following exsanguination of right arm with an Esmarch bandage, the arterial tourniquet was inflated to 220 mmHg.  The  procedure commenced with planning a lazy-S incision to expose the fourth dorsal compartment, fifth dorsal compartment, and ulnar head.  A routine surgical time-out was accomplished followed by skin incision.  The dorsal aspect of the ulnar wrist was explored identifying the main branch of the dorsal ulnar sensory branches and an accessory branch crossing directly over the distal radioulnar joint.  This led to a problematic dissection.  We exposed the fifth dorsal compartment distal to the wrist and identified the distal stump of the extensor digiti minimi.  This was a bifid tendon.  We dissected proximally exposing the dorsal capsule of the distal radioulnar joint and found a sinus tract and an osteophyte easily visible.  This was a typical Vaughan-Jackson syndrome presentation.  We then found the proximal stump and pseudo- tendon of the extensor digiti minimi in the distal forearm.  The extensor retinaculum was left intact through the fourth dorsal compartment proximally and the fifth and sixth dorsal compartments were elevated in an ulnar direction off the 4-5 intracompartmental septum. The ulnar head was exposed with subperiosteal dissection elevating the extensor carpi ulnaris followed by placement of baby Bennett retractors. After soft tissues were cleared circumferentially, the oscillating saw was used to amputate the ulnar head at its neck.  This was an intracapsular Darrach procedure.  I then debrided osteophytes and other fragments from the distal radioulnar joint.  A synovectomy was accomplished followed by identification of the ulnar styloid nonunion fragment.  This was providing support for the triangular fibrocartilage and ulnar capsular structures.  Therefore, it was left intact with simple trimming of its sharp edges.  The stump of the ulna was debrided of all sharp edges with a fine rongeur followed by imbrication of the dorsal capsule of the distal radioulnar joint  repairing it to the 4-5 extensor compartment septum. The extensor retinaculum was then imbricated creating a 2-layer closure over the distal ulna.  Our plan was to repair the extensor digiti minimi subcutaneously proximally and with a retinacular loop distally.  The radial tail of the extensor digiti minimi was harvested as an autogenous tendon graft and subsequently used with triple-weave Pulvertaft technique to reconstruct the extensor digiti minimi with 15% additional tension correcting the extensor lag of the small finger.  I then created an ulnarly based flap of extensor retinaculum that was repaired to the stump of the sixth dorsal compartment, recreating a retinacular sleeve for the distal extensor digiti minimi.  The Pulvertaft weaves were secured with multiple corner sutures of 3-0 Ethibond.  A very satisfactory reconstruction was achieved.  We then repaired the skin with subcutaneous 4-0 Vicryl and intradermal 3-0 Prolene segmental sutures followed by Steri-Strips.  Ms. Madl was placed in a forearm-based splint with the wrist in 30 degrees of dorsiflexion and her ring and small fingers fully extended at the MP joint.  We will keep her in a sling for 5 days, 24/7, and then we will place her in a sugar-tong splint with her forearm in supination and  her small and ring fingers extended with a volar __________-block splint.  There were no apparent complications.  Ms. Noyce tolerated the surgery and anesthesia well.  For aftercare, she was provided prescriptions for Vicodin 5 mg 1 p.o. q.4-6 hours p.r.n. pain, 30 tablets without refill.  Also Keflex 500 mg 1 p.o. q.8 hours x3 doses as a prophylactic antibiotic.  While she has a history of penicillin intolerance, she tolerated IV Ancef in the operating room without any worrisome response.     Katy Fitch Renatha Rosen, M.D.     RVS/MEDQ  D:  03/07/2011  T:  03/07/2011  Job:  [id number]  cc:   Corrie Mckusick,  M.D.  Electronically Signed by Josephine Igo M.D. on 03/12/2011 08:25:00 AM

## 2011-04-11 LAB — URINALYSIS, ROUTINE W REFLEX MICROSCOPIC
Bilirubin Urine: NEGATIVE
Glucose, UA: NEGATIVE
Ketones, ur: NEGATIVE
Nitrite: NEGATIVE
Specific Gravity, Urine: 1.01
Specific Gravity, Urine: 1.015
Urobilinogen, UA: 0.2
pH: 5.5

## 2011-04-11 LAB — BASIC METABOLIC PANEL
BUN: 5 — ABNORMAL LOW
CO2: 28
CO2: 29
Chloride: 104
Creatinine, Ser: 0.74
GFR calc Af Amer: >60
GFR calc non Af Amer: >60
Glucose, Bld: 94
Glucose, Bld: 99
Potassium: 3.8
Potassium: 3.9
Sodium: 138

## 2011-04-11 LAB — CBC
HCT: 28.4 — ABNORMAL LOW
HCT: 36.2
HCT: 40.4
Hemoglobin: 9.9 — ABNORMAL LOW
MCHC: 31.6
MCHC: 34.8
MCV: 65.2 — ABNORMAL LOW
MCV: 70.7 — ABNORMAL LOW
Platelets: 314
Platelets: 328
RBC: 3.65 — ABNORMAL LOW
RBC: 5.72 — ABNORMAL HIGH
RDW: 14.3
RDW: 14.5
WBC: 6.9
WBC: 9.7

## 2011-04-11 LAB — DIFFERENTIAL
Basophils Absolute: 0
Basophils Absolute: 0
Basophils Absolute: 0
Basophils Relative: 0
Eosinophils Absolute: 0.1
Eosinophils Relative: 2
Eosinophils Relative: 2
Lymphocytes Relative: 31
Lymphocytes Relative: 36
Lymphs Abs: 2.1
Monocytes Absolute: 0.7
Monocytes Relative: 13 — ABNORMAL HIGH
Neutro Abs: 2.9
Neutro Abs: 5.2
Neutrophils Relative %: 54
Neutrophils Relative %: 54

## 2011-04-11 LAB — PROTIME-INR
INR: 1
Prothrombin Time: 14

## 2011-04-11 LAB — MAGNESIUM: Magnesium: 2.1

## 2011-04-11 LAB — COMPREHENSIVE METABOLIC PANEL
Albumin: 3.2 — ABNORMAL LOW
Alkaline Phosphatase: 47
BUN: 12
BUN: 6
CO2: 27
Calcium: 8.7
Chloride: 100
Creatinine, Ser: 0.58
Creatinine, Ser: 0.62
GFR calc non Af Amer: >60
Potassium: 3.9
Total Bilirubin: 0.6
Total Protein: 5.4 — ABNORMAL LOW

## 2011-04-11 LAB — PHOSPHORUS: Phosphorus: 4.4

## 2011-04-11 LAB — LIPASE, BLOOD: Lipase: 23

## 2011-04-11 LAB — CK TOTAL AND CKMB (NOT AT ARMC)
CK, MB: 1
Total CK: 47

## 2011-04-11 LAB — HEMOGLOBIN A1C: Mean Plasma Glucose: 122

## 2011-04-11 LAB — CARDIAC PANEL(CRET KIN+CKTOT+MB+TROPI)
CK, MB: 0.8
Relative Index: INVALID
Total CK: 44

## 2011-04-11 LAB — APTT: aPTT: 31

## 2011-04-11 LAB — CULTURE, BLOOD (ROUTINE X 2): Culture: NO GROWTH

## 2011-04-11 LAB — URINE CULTURE

## 2011-04-11 LAB — TROPONIN I: Troponin I: 0.01

## 2011-05-16 ENCOUNTER — Other Ambulatory Visit (HOSPITAL_COMMUNITY): Payer: Self-pay | Admitting: Family Medicine

## 2011-05-16 DIAGNOSIS — Z139 Encounter for screening, unspecified: Secondary | ICD-10-CM

## 2011-05-20 ENCOUNTER — Ambulatory Visit (HOSPITAL_COMMUNITY): Payer: Medicare Other

## 2011-05-22 ENCOUNTER — Ambulatory Visit (HOSPITAL_COMMUNITY)
Admission: RE | Admit: 2011-05-22 | Discharge: 2011-05-22 | Disposition: A | Discharge status: Home / Self Care | Payer: Medicare Other | Source: Ambulatory Visit | Attending: Family Medicine | Admitting: Family Medicine

## 2011-05-22 DIAGNOSIS — M818 Other osteoporosis without current pathological fracture: Secondary | ICD-10-CM | POA: Insufficient documentation

## 2011-05-22 DIAGNOSIS — Z139 Encounter for screening, unspecified: Secondary | ICD-10-CM

## 2011-05-22 DIAGNOSIS — Z13820 Encounter for screening for osteoporosis: Secondary | ICD-10-CM | POA: Insufficient documentation

## 2011-08-05 ENCOUNTER — Other Ambulatory Visit (HOSPITAL_COMMUNITY): Payer: Self-pay | Admitting: Endocrinology

## 2011-08-05 DIAGNOSIS — E049 Nontoxic goiter, unspecified: Secondary | ICD-10-CM

## 2011-08-08 ENCOUNTER — Ambulatory Visit (HOSPITAL_COMMUNITY)
Admission: RE | Admit: 2011-08-08 | Discharge: 2011-08-08 | Disposition: A | Discharge status: Home / Self Care | Payer: Medicare Other | Source: Ambulatory Visit | Attending: Endocrinology | Admitting: Endocrinology

## 2011-08-08 DIAGNOSIS — E049 Nontoxic goiter, unspecified: Secondary | ICD-10-CM | POA: Insufficient documentation

## 2011-08-08 DIAGNOSIS — R9389 Abnormal findings on diagnostic imaging of other specified body structures: Secondary | ICD-10-CM | POA: Insufficient documentation

## 2013-01-29 ENCOUNTER — Other Ambulatory Visit: Payer: Self-pay | Admitting: *Deleted

## 2013-01-29 MED ORDER — OLMESARTAN MEDOXOMIL-HCTZ 40-12.5 MG PO TABS: 1.0000 | ORAL_TABLET | Freq: Every day | ORAL | Status: DC

## 2013-01-29 NOTE — Telephone Encounter (Signed)
Refill(s) sent to pharmacy.  First Rx sent to [facility] and call made to deactivate Rx.

## 2013-03-30 ENCOUNTER — Other Ambulatory Visit (HOSPITAL_COMMUNITY): Payer: Self-pay | Admitting: Endocrinology

## 2013-03-30 DIAGNOSIS — E049 Nontoxic goiter, unspecified: Secondary | ICD-10-CM

## 2013-05-12 ENCOUNTER — Other Ambulatory Visit (HOSPITAL_COMMUNITY): Payer: Self-pay | Admitting: Family Medicine

## 2013-05-12 ENCOUNTER — Ambulatory Visit (HOSPITAL_COMMUNITY)
Admission: RE | Admit: 2013-05-12 | Discharge: 2013-05-12 | Disposition: A | Discharge status: Home / Self Care | Payer: Medicare Other | Source: Ambulatory Visit | Attending: Family Medicine | Admitting: Family Medicine

## 2013-05-12 DIAGNOSIS — Z139 Encounter for screening, unspecified: Secondary | ICD-10-CM

## 2013-05-12 DIAGNOSIS — M7989 Other specified soft tissue disorders: Secondary | ICD-10-CM

## 2013-05-21 ENCOUNTER — Ambulatory Visit (HOSPITAL_COMMUNITY): Payer: Medicare Other

## 2013-06-07 ENCOUNTER — Other Ambulatory Visit: Payer: Self-pay | Admitting: Cardiovascular Disease

## 2013-06-07 NOTE — Telephone Encounter (Signed)
Rx was sent to pharmacy electronically. 

## 2013-08-02 ENCOUNTER — Ambulatory Visit (HOSPITAL_COMMUNITY)
Admission: RE | Admit: 2013-08-02 | Discharge: 2013-08-02 | Disposition: A | Discharge status: Home / Self Care | Payer: Medicare Other | Source: Ambulatory Visit | Attending: Endocrinology | Admitting: Endocrinology

## 2013-08-02 DIAGNOSIS — E049 Nontoxic goiter, unspecified: Secondary | ICD-10-CM

## 2013-08-02 DIAGNOSIS — E042 Nontoxic multinodular goiter: Secondary | ICD-10-CM | POA: Insufficient documentation

## 2013-10-04 ENCOUNTER — Other Ambulatory Visit: Payer: Self-pay | Admitting: Cardiovascular Disease

## 2013-10-04 NOTE — Telephone Encounter (Signed)
Rx was sent to pharmacy electronically. 

## 2013-11-15 ENCOUNTER — Other Ambulatory Visit: Payer: Self-pay | Admitting: Cardiovascular Disease

## 2013-11-17 NOTE — Telephone Encounter (Signed)
Rx was sent to pharmacy electronically. 

## 2013-12-13 ENCOUNTER — Other Ambulatory Visit: Payer: Self-pay | Admitting: Cardiovascular Disease

## 2013-12-13 NOTE — Telephone Encounter (Signed)
Rx was sent to pharmacy electronically. Patient needs an appointment for future refills or defer to PCP.

## 2013-12-24 ENCOUNTER — Other Ambulatory Visit: Payer: Self-pay | Admitting: Cardiovascular Disease

## 2014-08-01 ENCOUNTER — Other Ambulatory Visit (HOSPITAL_COMMUNITY): Payer: Self-pay | Admitting: Endocrinology

## 2014-08-01 DIAGNOSIS — E049 Nontoxic goiter, unspecified: Secondary | ICD-10-CM

## 2014-08-03 ENCOUNTER — Ambulatory Visit (HOSPITAL_COMMUNITY): Payer: Medicare Other

## 2015-02-08 ENCOUNTER — Encounter: Payer: Self-pay | Admitting: Cardiovascular Disease

## 2015-11-21 ENCOUNTER — Other Ambulatory Visit (HOSPITAL_COMMUNITY)
Admission: AD | Admit: 2015-11-21 | Discharge: 2015-11-21 | Disposition: A | Discharge status: Home / Self Care | Payer: Medicare Other | Source: Skilled Nursing Facility | Attending: Vascular Surgery | Admitting: Vascular Surgery

## 2015-11-21 DIAGNOSIS — N390 Urinary tract infection, site not specified: Secondary | ICD-10-CM | POA: Insufficient documentation

## 2015-11-21 LAB — URINE MICROSCOPIC-ADD ON: RBC / HPF: NONE SEEN RBC/hpf (ref 0–5)

## 2015-11-21 LAB — URINALYSIS, ROUTINE W REFLEX MICROSCOPIC
Bilirubin Urine: NEGATIVE
GLUCOSE, UA: NEGATIVE mg/dL
Hgb urine dipstick: NEGATIVE
KETONES UR: NEGATIVE mg/dL
NITRITE: NEGATIVE
PROTEIN: NEGATIVE mg/dL
Specific Gravity, Urine: <1.005 — ABNORMAL LOW (ref 1.005–1.030)
pH: 5.5 (ref 5.0–8.0)

## 2015-11-24 LAB — URINE CULTURE

## 2016-09-05 ENCOUNTER — Inpatient Hospital Stay (HOSPITAL_COMMUNITY)
Admission: EM | Admit: 2016-09-05 | Discharge: 2016-09-09 | DRG: 956 | Disposition: A | Discharge status: Skilled Nursing Facility | Payer: Medicare Other | Attending: Internal Medicine | Admitting: Internal Medicine

## 2016-09-05 ENCOUNTER — Emergency Department (HOSPITAL_COMMUNITY): Payer: Medicare Other

## 2016-09-05 ENCOUNTER — Encounter (HOSPITAL_COMMUNITY): Payer: Self-pay | Admitting: Emergency Medicine

## 2016-09-05 DIAGNOSIS — D62 Acute posthemorrhagic anemia: Secondary | ICD-10-CM | POA: Diagnosis not present

## 2016-09-05 DIAGNOSIS — E089 Diabetes mellitus due to underlying condition without complications: Secondary | ICD-10-CM | POA: Diagnosis present

## 2016-09-05 DIAGNOSIS — E0590 Thyrotoxicosis, unspecified without thyrotoxic crisis or storm: Secondary | ICD-10-CM | POA: Diagnosis present

## 2016-09-05 DIAGNOSIS — E876 Hypokalemia: Secondary | ICD-10-CM | POA: Diagnosis not present

## 2016-09-05 DIAGNOSIS — M810 Age-related osteoporosis without current pathological fracture: Secondary | ICD-10-CM | POA: Diagnosis present

## 2016-09-05 DIAGNOSIS — Z800 Family history of malignant neoplasm of digestive organs: Secondary | ICD-10-CM | POA: Diagnosis not present

## 2016-09-05 DIAGNOSIS — I10 Essential (primary) hypertension: Secondary | ICD-10-CM | POA: Diagnosis present

## 2016-09-05 DIAGNOSIS — W19XXXA Unspecified fall, initial encounter: Secondary | ICD-10-CM

## 2016-09-05 DIAGNOSIS — Z882 Allergy status to sulfonamides status: Secondary | ICD-10-CM | POA: Diagnosis not present

## 2016-09-05 DIAGNOSIS — S7292XA Unspecified fracture of left femur, initial encounter for closed fracture: Secondary | ICD-10-CM

## 2016-09-05 DIAGNOSIS — Z8249 Family history of ischemic heart disease and other diseases of the circulatory system: Secondary | ICD-10-CM

## 2016-09-05 DIAGNOSIS — Z419 Encounter for procedure for purposes other than remedying health state, unspecified: Secondary | ICD-10-CM

## 2016-09-05 DIAGNOSIS — S72142A Displaced intertrochanteric fracture of left femur, initial encounter for closed fracture: Secondary | ICD-10-CM | POA: Diagnosis present

## 2016-09-05 DIAGNOSIS — Y92830 Public park as the place of occurrence of the external cause: Secondary | ICD-10-CM

## 2016-09-05 DIAGNOSIS — W1830XA Fall on same level, unspecified, initial encounter: Secondary | ICD-10-CM | POA: Diagnosis present

## 2016-09-05 DIAGNOSIS — Z888 Allergy status to other drugs, medicaments and biological substances status: Secondary | ICD-10-CM | POA: Diagnosis not present

## 2016-09-05 DIAGNOSIS — Z79899 Other long term (current) drug therapy: Secondary | ICD-10-CM | POA: Diagnosis not present

## 2016-09-05 DIAGNOSIS — Z880 Allergy status to penicillin: Secondary | ICD-10-CM | POA: Diagnosis not present

## 2016-09-05 DIAGNOSIS — S32502A Unspecified fracture of left pubis, initial encounter for closed fracture: Secondary | ICD-10-CM | POA: Diagnosis present

## 2016-09-05 DIAGNOSIS — E119 Type 2 diabetes mellitus without complications: Secondary | ICD-10-CM | POA: Diagnosis present

## 2016-09-05 DIAGNOSIS — E7800 Pure hypercholesterolemia, unspecified: Secondary | ICD-10-CM | POA: Diagnosis present

## 2016-09-05 DIAGNOSIS — I480 Paroxysmal atrial fibrillation: Secondary | ICD-10-CM | POA: Diagnosis present

## 2016-09-05 DIAGNOSIS — Z823 Family history of stroke: Secondary | ICD-10-CM | POA: Diagnosis not present

## 2016-09-05 DIAGNOSIS — S72002A Fracture of unspecified part of neck of left femur, initial encounter for closed fracture: Secondary | ICD-10-CM

## 2016-09-05 DIAGNOSIS — F0390 Unspecified dementia without behavioral disturbance: Secondary | ICD-10-CM | POA: Diagnosis present

## 2016-09-05 DIAGNOSIS — D539 Nutritional anemia, unspecified: Secondary | ICD-10-CM | POA: Diagnosis present

## 2016-09-05 HISTORY — DX: Acute posthemorrhagic anemia: D62

## 2016-09-05 HISTORY — DX: Unspecified dementia, unspecified severity, without behavioral disturbance, psychotic disturbance, mood disturbance, and anxiety: F03.90

## 2016-09-05 LAB — CBC WITH DIFFERENTIAL/PLATELET
BASOS ABS: 0 10*3/uL (ref 0.0–0.1)
Basophils Relative: 0 %
Eosinophils Absolute: 0.1 10*3/uL (ref 0.0–0.7)
Eosinophils Relative: 0 %
HEMATOCRIT: 30.8 % — AB (ref 36.0–46.0)
HEMOGLOBIN: 10.5 g/dL — AB (ref 12.0–15.0)
LYMPHS PCT: 12 %
Lymphs Abs: 1.6 10*3/uL (ref 0.7–4.0)
MCH: 24.8 pg — ABNORMAL LOW (ref 26.0–34.0)
MCHC: 34.1 g/dL (ref 30.0–36.0)
MCV: 72.8 fL — AB (ref 78.0–100.0)
MONOS PCT: 6 %
Monocytes Absolute: 0.8 10*3/uL (ref 0.1–1.0)
NEUTROS ABS: 11.3 10*3/uL — AB (ref 1.7–7.7)
NEUTROS PCT: 82 %
Platelets: 186 10*3/uL (ref 150–400)
RBC: 4.23 MIL/uL (ref 3.87–5.11)
RDW: 21 % — ABNORMAL HIGH (ref 11.5–15.5)
WBC: 13.8 10*3/uL — ABNORMAL HIGH (ref 4.0–10.5)

## 2016-09-05 LAB — ABO/RH: ABO/RH(D): A POS

## 2016-09-05 LAB — I-STAT CHEM 8, ED
BUN: 10 mg/dL (ref 6–20)
CREATININE: 0.7 mg/dL (ref 0.44–1.00)
Calcium, Ion: 1.01 mmol/L — ABNORMAL LOW (ref 1.15–1.40)
Chloride: 105 mmol/L (ref 101–111)
GLUCOSE: 150 mg/dL — AB (ref 65–99)
HEMATOCRIT: 31 % — AB (ref 36.0–46.0)
HEMOGLOBIN: 10.5 g/dL — AB (ref 12.0–15.0)
POTASSIUM: 3.3 mmol/L — AB (ref 3.5–5.1)
Sodium: 141 mmol/L (ref 135–145)
TCO2: 26 mmol/L (ref 0–100)

## 2016-09-05 LAB — PROTIME-INR
INR: 1.06
Prothrombin Time: 13.8 seconds (ref 11.4–15.2)

## 2016-09-05 MED ORDER — IRBESARTAN 300 MG PO TABS: 300.0000 mg | ORAL_TABLET | Freq: Every day | ORAL | Status: DC

## 2016-09-05 MED ORDER — HYDRALAZINE HCL 20 MG/ML IJ SOLN
10.0000 mg | INTRAMUSCULAR | Status: DC | PRN
  Administered 2016-09-05: 20 mg via INTRAVENOUS
  Filled 2016-09-05: qty 1

## 2016-09-05 MED ORDER — ONDANSETRON HCL 4 MG/2ML IJ SOLN
4.0000 mg | Freq: Four times a day (QID) | INTRAMUSCULAR | Status: AC
  Administered 2016-09-05: 4 mg via INTRAVENOUS
  Filled 2016-09-05: qty 2

## 2016-09-05 MED ORDER — OLMESARTAN MEDOXOMIL-HCTZ 40-12.5 MG PO TABS: 1.0000 | ORAL_TABLET | Freq: Every day | ORAL | Status: DC

## 2016-09-05 MED ORDER — HEPARIN SODIUM (PORCINE) 5000 UNIT/ML IJ SOLN
5000.0000 [IU] | Freq: Once | INTRAMUSCULAR | Status: AC
Start: 2016-09-05 — End: 2016-09-05

## 2016-09-05 MED ORDER — HYDROCHLOROTHIAZIDE 12.5 MG PO CAPS: 12.5000 mg | ORAL_CAPSULE | Freq: Every day | ORAL | Status: DC

## 2016-09-05 MED ORDER — BUPIVACAINE HCL (PF) 0.25 % IJ SOLN: 30.0000 mL | Freq: Once | INTRAMUSCULAR | Status: DC

## 2016-09-05 MED ORDER — ACETAMINOPHEN 500 MG PO TABS
1000.0000 mg | ORAL_TABLET | Freq: Once | ORAL | Status: AC
  Administered 2016-09-05: 1000 mg via ORAL
  Filled 2016-09-05: qty 2

## 2016-09-05 MED ORDER — POTASSIUM CHLORIDE CRYS ER 20 MEQ PO TBCR
20.0000 meq | EXTENDED_RELEASE_TABLET | Freq: Once | ORAL | Status: AC
  Administered 2016-09-05: 20 meq via ORAL

## 2016-09-05 MED ORDER — HYDROCODONE-ACETAMINOPHEN 5-325 MG PO TABS
1.0000 | ORAL_TABLET | Freq: Four times a day (QID) | ORAL | Status: DC | PRN
  Administered 2016-09-05 – 2016-09-06 (×2): 2 via ORAL
  Filled 2016-09-05 (×3): qty 2

## 2016-09-05 MED ORDER — RISEDRONATE SODIUM 5 MG PO TABS: 35.0000 mg | ORAL_TABLET | ORAL | Status: DC

## 2016-09-05 MED ORDER — PRAVASTATIN SODIUM 40 MG PO TABS: 40.0000 mg | ORAL_TABLET | Freq: Every day | ORAL | Status: DC

## 2016-09-05 MED ORDER — MORPHINE SULFATE (PF) 4 MG/ML IV SOLN
6.0000 mg | Freq: Once | INTRAVENOUS | Status: AC
  Administered 2016-09-05: 6 mg via INTRAVENOUS
  Filled 2016-09-05: qty 2

## 2016-09-05 MED ORDER — MORPHINE SULFATE (PF) 4 MG/ML IV SOLN
0.5000 mg | INTRAVENOUS | Status: DC | PRN
  Administered 2016-09-05: 0.52 mg via INTRAVENOUS
  Filled 2016-09-05: qty 1

## 2016-09-05 MED ORDER — AMITRIPTYLINE HCL 10 MG PO TABS
10.0000 mg | ORAL_TABLET | Freq: Every day | ORAL | Status: DC
  Administered 2016-09-05 – 2016-09-08 (×4): 10 mg via ORAL
  Filled 2016-09-05 (×4): qty 1

## 2016-09-05 MED ORDER — METOPROLOL TARTRATE 25 MG PO TABS
12.5000 mg | ORAL_TABLET | Freq: Two times a day (BID) | ORAL | Status: DC
  Administered 2016-09-05 – 2016-09-06 (×2): 12.5 mg via ORAL
  Filled 2016-09-05 (×2): qty 1

## 2016-09-05 MED ORDER — SIMVASTATIN 40 MG PO TABS
40.0000 mg | ORAL_TABLET | Freq: Every day | ORAL | Status: DC
  Administered 2016-09-05 – 2016-09-08 (×4): 40 mg via ORAL
  Filled 2016-09-05 (×4): qty 1

## 2016-09-05 MED ORDER — BUPIVACAINE HCL 0.25 % IJ SOLN
20.0000 mL | Freq: Once | INTRAMUSCULAR | Status: DC
  Filled 2016-09-05: qty 20

## 2016-09-05 NOTE — ED Triage Notes (Signed)
[place] from park, had mechanical fall in restroom, C/O L hip pain. External rotation of hip noted. PVA intact. Given 8mg  Morphine en route. VSS.

## 2016-09-05 NOTE — ED Notes (Signed)
Admitting at bedside 

## 2016-09-05 NOTE — H&P (Signed)
History and Physical    NAYSA DONIS [id number] DOB: 09-11-1928 DOA: 09/05/2016   PCP: Glo Herring., MD Chief Complaint:  Chief Complaint  Patient presents with  . Fall  . Hip Pain    HPI: AKANE BURDICK is a 83 y.o. female with medical history significant of HTN, dementia with short term memory loss.  Patient had mechanical fall [place] park.  L hip pain and LLE deformity post fall.  Pain is severe, worse with movement, unable to bear weight.  Patient brought in to ED  ED Course: L intertroch fx of hip.  Review of Systems: As per HPI otherwise 10 point review of systems negative.    Past Medical History:  Diagnosis Date  . Atrial fibrillation ([place])   . Dementia   . Diabetes mellitus   . Diverticulitis 8/09, 3/10   abscess in 2009(medical tx)  . HTN (hypertension)   . Hypercholesteremia   . Hyperthyroidism    s/p radiation, surgery  . IDA (iron deficiency anemia)   . Osteoporosis   . Thalassemia   . Tubular adenoma of colon 12/15/08   Colonosocpy Dr Tonita Cong removed cecum    Past Surgical History:  Procedure Laterality Date  . CESAREAN SECTION     x2  . CHOLECYSTECTOMY    . FOOT SURGERY     bilat  . THYROIDECTOMY, PARTIAL       reports that she has never smoked. She does not have any smokeless tobacco history on file. She reports that she drinks alcohol. She reports that she does not use drugs.  Allergies  Allergen Reactions  . Procaine Hcl Other (See Comments)    Numbness/jerkiness  . Sulfonamide Derivatives Other (See Comments)    hypertension  . Penicillins Rash    REACTION: unknown reaction    Family History  Problem Relation Age of Onset  . Pneumonia Father   . Coronary artery disease Mother   . Stroke Mother   . Colon cancer Mother 75  . Thalassemia Sister       Prior to Admission medications   Medication Sig Start Date End Date Taking? Authorizing Provider  amitriptyline (ELAVIL) 10 MG tablet Take 10 mg by  mouth at bedtime.      Historical Provider, MD  BENICAR HCT 40-12.5 MG per tablet TAKE 1 TABLET BY MOUTH ONCE DAILY. 12/13/13   Mihai Croitoru, MD  Calcium Carbonate-Vitamin D (CALCIUM + D PO) Take by mouth 2 (two) times daily.      Historical Provider, MD  Coenzyme Q10 (CO Q-10) 100 MG CAPS Take by mouth.      Historical Provider, MD  estradiol (VAGIFEM) 25 MCG vaginal tablet Place 25 mcg vaginally 2 (two) times a week.      Historical Provider, MD  fish oil-omega-3 fatty acids 1000 MG capsule Take 2 g by mouth daily.      Historical Provider, MD  Glucosamine-Chondroit-Vit C-Mn (GLUCOSAMINE 1500 COMPLEX PO) Take by mouth.      Historical Provider, MD  metoprolol tartrate (LOPRESSOR) 25 MG tablet Take 25 mg by mouth 2 (two) times daily. 1/2 pill  Bid      Historical Provider, MD  pravastatin (PRAVACHOL) 40 MG tablet TAKE 1 TABLET BY MOUTH AT BEDTIME. 10/04/13   Mihai Croitoru, MD  risedronate (ACTONEL) 35 MG tablet Take 35 mg by mouth every 7 (seven) days. with water on empty stomach, nothing by mouth or lie down for next 30 minutes.     Historical Provider,  MD  simvastatin (ZOCOR) 40 MG tablet Take 40 mg by mouth at bedtime.      Historical Provider, MD    Physical Exam: Vitals:   09/05/16 1935 09/05/16 2030 09/05/16 2100 09/05/16 2130  BP: 191/90 (!) 204/84 195/72 147/66  Pulse: 77 66 72 89  Resp: 21 21 17 16   Temp: 98.2 F (36.8 C)     TempSrc: Oral     SpO2: 99% 99% 98% 93%  Weight: 64.4 kg (142 lb)     Height: 5\' 2"  (1.575 m)         Constitutional: NAD, calm, comfortable Eyes: PERRL, lids and conjunctivae normal ENMT: Mucous membranes are moist. Posterior pharynx clear of any exudate or lesions.Normal dentition.  Neck: normal, supple, no masses, no thyromegaly Respiratory: clear to auscultation bilaterally, no wheezing, no crackles. Normal respiratory effort. No accessory muscle use.  Cardiovascular: Regular rate and rhythm, no murmurs / rubs / gallops. No extremity edema. 2+  pedal pulses. No carotid bruits.  Abdomen: no tenderness, no masses palpated. No hepatosplenomegaly. Bowel sounds positive.  Musculoskeletal: LLE shortened and externally rotated Skin: no rashes, lesions, ulcers. No induration Neurologic: CN 2-12 grossly intact. Sensation intact, DTR normal. Strength 5/5 in all 4.  Psychiatric: Normal judgment and insight. Alert and oriented x 3. Normal mood.    Labs on Admission: I have personally reviewed following labs and imaging studies  CBC:  Recent Labs Lab 09/05/16 2027 09/05/16 2048  WBC 13.8*  --   NEUTROABS 11.3*  --   HGB 10.5* 10.5*  HCT 30.8* 31.0*  MCV 72.8*  --   PLT 186  --    Basic Metabolic Panel:  Recent Labs Lab 09/05/16 2048  NA 141  K 3.3*  CL 105  GLUCOSE 150*  BUN 10  CREATININE 0.70   GFR: Estimated Creatinine Clearance: 44.5 mL/min (by C-G formula based on SCr of 0.7 mg/dL). Liver Function Tests: No results for input(s): AST, ALT, ALKPHOS, BILITOT, PROT, ALBUMIN in the last 168 hours. No results for input(s): LIPASE, AMYLASE in the last 168 hours. No results for input(s): AMMONIA in the last 168 hours. Coagulation Profile:  Recent Labs Lab 09/05/16 2031  INR 1.06   Cardiac Enzymes: No results for input(s): CKTOTAL, CKMB, CKMBINDEX, TROPONINI in the last 168 hours. BNP (last 3 results) No results for input(s): PROBNP in the last 8760 hours. HbA1C: No results for input(s): HGBA1C in the last 72 hours. CBG: No results for input(s): GLUCAP in the last 168 hours. Lipid Profile: No results for input(s): CHOL, HDL, LDLCALC, TRIG, CHOLHDL, LDLDIRECT in the last 72 hours. Thyroid Function Tests: No results for input(s): TSH, T4TOTAL, FREET4, T3FREE, THYROIDAB in the last 72 hours. Anemia Panel: No results for input(s): VITAMINB12, FOLATE, FERRITIN, TIBC, IRON, RETICCTPCT in the last 72 hours. Urine analysis:    Component Value Date/Time   COLORURINE YELLOW 11/21/2015 [place]  11/21/2015 1550   LABSPEC <1.005 (L) 11/21/2015 1550   PHURINE 5.5 11/21/2015 1550   GLUCOSEU NEGATIVE 11/21/2015 1550   HGBUR NEGATIVE 11/21/2015 1550   BILIRUBINUR NEGATIVE 11/21/2015 1550   KETONESUR NEGATIVE 11/21/2015 1550   PROTEINUR NEGATIVE 11/21/2015 1550   UROBILINOGEN 0.2 01/18/2011 2222   NITRITE NEGATIVE 11/21/2015 1550   LEUKOCYTESUR SMALL (A) 11/21/2015 1550   Sepsis Labs: @LABRCNTIP (procalcitonin:4,lacticidven:4) )No results found for this or any previous visit (from the past 240 hour(s)).   Radiological Exams on Admission: Dg Chest 1 View  Result Date: 09/05/2016 CLINICAL DATA:  Preoperative chest radiograph. Left hip deformity. Initial encounter. EXAM: CHEST 1 VIEW COMPARISON:  Chest radiograph performed 12/19/2009 FINDINGS: The lungs are well-aerated. Peribronchial thickening is noted. A calcified granuloma is noted at the left lung base. There is no evidence of focal opacification, pleural effusion or pneumothorax. The cardiomediastinal silhouette is mildly enlarged. No acute osseous abnormalities are seen. IMPRESSION: Peribronchial thickening noted. Mild cardiomegaly. No displaced rib fracture seen. Electronically Signed   By: Garald Balding M.D.   On: 09/05/2016 20:20   Ct Head Wo Contrast  Result Date: 09/05/2016 CLINICAL DATA:  Patient fell on the way of the bathroom. EXAM: CT HEAD WITHOUT CONTRAST TECHNIQUE: Contiguous axial images were obtained from the base of the skull through the vertex without intravenous contrast. COMPARISON:  12/19/2009 FINDINGS: Brain: There is no evidence for acute hemorrhage, hydrocephalus, mass lesion, or abnormal extra-axial fluid collection. No definite CT evidence for acute infarction. Diffuse loss of parenchymal volume is consistent with atrophy. Patchy low attenuation in the deep hemispheric and periventricular white matter is nonspecific, but likely reflects chronic microvascular ischemic demyelination. Vascular: Atherosclerotic  calcification is visualized in the carotid arteries. No dense MCA sign. Major dural sinuses are unremarkable. Skull: No evidence for fracture. No worrisome lytic or sclerotic lesion. Sinuses/Orbits: The visualized paranasal sinuses and mastoid air cells are clear. Visualized portions of the globes and intraorbital fat are unremarkable. Other: None. IMPRESSION: 1. Stable exam.  No acute intracranial abnormality. 2. Atrophy with chronic small vessel white matter ischemic disease. Electronically Signed   By: Misty Stanley M.D.   On: 09/05/2016 20:32   Dg Hip Unilat With Pelvis 2-3 Views Left  Result Date: 09/05/2016 CLINICAL DATA:  Left hip deformity after fall. EXAM: DG HIP (WITH OR WITHOUT PELVIS) 2-3V LEFT; LEFT FEMUR 2 VIEWS COMPARISON:  None. FINDINGS: Left hip: Intertrochanteric left proximal femur fracture with moderate coxa vara deformity. No left hip dislocation. Lucency in the lateral aspect of the left symphysis pubis may represent a nondisplaced fracture. Advanced degenerative changes of the lower lumbar spine. Left femur: Intertrochanteric left proximal femur fracture with moderate coxa vara deformity. The left knee joint is maintained. No additional femoral fracture. IMPRESSION: Intertrochanteric left proximal femur fracture with moderate coxa vara deformity. No left hip dislocation. Probable nondisplaced fracture of left pubis symphysis. Electronically Signed   By: Kristine Garbe M.D.   On: 09/05/2016 20:23   Dg Femur Min 2 Views Left  Result Date: 09/05/2016 CLINICAL DATA:  Left hip deformity after fall. EXAM: DG HIP (WITH OR WITHOUT PELVIS) 2-3V LEFT; LEFT FEMUR 2 VIEWS COMPARISON:  None. FINDINGS: Left hip: Intertrochanteric left proximal femur fracture with moderate coxa vara deformity. No left hip dislocation. Lucency in the lateral aspect of the left symphysis pubis may represent a nondisplaced fracture. Advanced degenerative changes of the lower lumbar spine. Left femur:  Intertrochanteric left proximal femur fracture with moderate coxa vara deformity. The left knee joint is maintained. No additional femoral fracture. IMPRESSION: Intertrochanteric left proximal femur fracture with moderate coxa vara deformity. No left hip dislocation. Probable nondisplaced fracture of left pubis symphysis. Electronically Signed   By: Kristine Garbe M.D.   On: 09/05/2016 20:23    EKG: Independently reviewed.  Assessment/Plan Principal Problem:   Closed intertrochanteric fracture of left hip, initial encounter [facility]) Active Problems:   Essential hypertension   Dementia    1. L hip fx - 1. Hip fx pathway 2. Heparin 5k units [place] now x1 3. Then needs post op PPx started 12 hrs  after surgery if cleared to do so by ortho 4. Continuous pulse ox, now needing some O2 probably secondary to the 14mg  of morphine she has gotten for pain between EMS and ED. 2. HTN - continue home meds 3. Dementia - chronic, mild-moderate, and baseline   DVT prophylaxis: Hep [place] now X1, SCDs, then post op anticoag 12h after surgery Code Status: Full Family Communication: Family at bedside Consults called: Ortho Admission status: Admit to inpatient   Etta Quill DO [facility] Pager [phone] from 7PM-7AM  If 7AM-7PM, please contact the day physician for the patient [email or web address] Password [facility]  09/05/2016, 9:57 PM

## 2016-09-05 NOTE — ED Notes (Signed)
Keys and shoes given to pt sons

## 2016-09-05 NOTE — Consult Note (Signed)
Left intertroch fracture  Plan for IM nail Friday 2/23 Please keep NPO at midnight  Formal consult to follow

## 2016-09-05 NOTE — ED Provider Notes (Signed)
[place] DEPT Provider Note   CSN: [id number] Arrival date & time: 09/05/16  1932     History   Chief Complaint Chief Complaint  Patient presents with  . Fall  . Hip Pain    HPI [name] is a 83 y.o. female.  HPI Arrives after mechanical fall where she landed on left hip States pain severe at left hip Did not hit head, has no neck pain Only on ASA Family states mild dementia No numbness, weakness No prior hip surgery or instrumentation Otherwise healthy Movement worsens pain  Past Medical History:  Diagnosis Date  . Atrial fibrillation ([place])   . Dementia   . Diabetes mellitus   . Diverticulitis 8/09, 3/10   abscess in 2009(medical tx)  . HTN (hypertension)   . Hypercholesteremia   . Hyperthyroidism    s/p radiation, surgery  . IDA (iron deficiency anemia)   . Osteoporosis   . Thalassemia   . Tubular adenoma of colon 12/15/08   Colonosocpy Dr Tonita Cong removed cecum    Patient Active Problem List   Diagnosis Date Noted  . Closed intertrochanteric fracture of left hip, initial encounter ([place]) 09/05/2016  . Dementia 09/05/2016  . Abdominal pain 01/10/2011  . DERANGEMENT OF POSTERIOR HORN OF MEDIAL MENISCUS 09/25/2010  . CLOSED DISLOCATION OF DISTAL RADIOULNAR 03/28/2010  . COLONIC POLYPS, ADENOMATOUS, HX OF 07/31/2009  . PAROXYSMAL ATRIAL FIBRILLATION 10/26/2008  . ANEMIA, CHRONIC 10/25/2008  . OTHER THALASSEMIA 10/25/2008  . DIVERTICULITIS, COLON 10/25/2008  . DM 10/24/2008  . HYPERCHOLESTEROLEMIA 10/24/2008  . ANEMIA, IRON DEFICIENCY 10/24/2008  . Essential hypertension 10/24/2008  . OSTEOPOROSIS 10/24/2008  . HYPERTHYROIDISM, HX OF 10/24/2008    Past Surgical History:  Procedure Laterality Date  . CESAREAN SECTION     x2  . CHOLECYSTECTOMY    . FOOT SURGERY     bilat  . THYROIDECTOMY, PARTIAL      OB History    No data available       Home Medications    Prior to Admission medications   Medication Sig Start Date End  Date Taking? Authorizing Provider  alendronate (FOSAMAX) 70 MG tablet Take 70 mg by mouth once a week. Take with a full glass of water on an empty stomach. Take on Wednesdays   Yes Historical Provider, MD  Calcium Carbonate-Vitamin D (CALCIUM + D PO) Take by mouth 2 (two) times daily.     Yes Historical Provider, MD  Cranberry 425 MG CAPS Take 1 capsule by mouth 2 (two) times daily.   Yes Historical Provider, MD  Dextromethorphan-Guaifenesin (ROBITUSSIN DM) 10-100 MG/5ML liquid Take 10 mLs by mouth every 12 (twelve) hours.   Yes Historical Provider, MD  docusate sodium (COLACE) 100 MG capsule Take 100 mg by mouth 2 (two) times daily.   Yes Historical Provider, MD  donepezil (ARICEPT) 5 MG tablet Take 5 mg by mouth at bedtime.   Yes Historical Provider, MD  estradiol (VAGIFEM) 25 MCG vaginal tablet Place 25 mcg vaginally 2 (two) times a week.     Yes Historical Provider, MD  iron polysaccharides (NIFEREX) 150 MG capsule Take 150 mg by mouth 2 (two) times daily.   Yes Historical Provider, MD  memantine (NAMENDA) 10 MG tablet Take 10 mg by mouth 2 (two) times daily.   Yes Historical Provider, MD  vitamin B-6 (PYRIDOXINE) 25 MG tablet Take 25 mg by mouth daily.   Yes Historical Provider, MD  amitriptyline (ELAVIL) 10 MG tablet Take 10 mg by mouth at  bedtime.      Historical Provider, MD  BENICAR HCT 40-12.5 MG per tablet TAKE 1 TABLET BY MOUTH ONCE DAILY. Patient not taking: Reported on 09/05/2016 12/13/13   Dani Gobble Croitoru, MD  Coenzyme Q10 (CO Q-10) 100 MG CAPS Take by mouth.      Historical Provider, MD  fish oil-omega-3 fatty acids 1000 MG capsule Take 2 g by mouth daily.      Historical Provider, MD  Glucosamine-Chondroit-Vit C-Mn (GLUCOSAMINE 1500 COMPLEX PO) Take by mouth.      Historical Provider, MD  metoprolol tartrate (LOPRESSOR) 25 MG tablet Take 25 mg by mouth 2 (two) times daily. 1/2 pill  Bid      Historical Provider, MD  pravastatin (PRAVACHOL) 40 MG tablet TAKE 1 TABLET BY MOUTH AT  BEDTIME. Patient not taking: Reported on 09/05/2016 10/04/13   Sanda Klein, MD  risedronate (ACTONEL) 35 MG tablet Take 35 mg by mouth every 7 (seven) days. with water on empty stomach, nothing by mouth or lie down for next 30 minutes.     Historical Provider, MD  simvastatin (ZOCOR) 40 MG tablet Take 40 mg by mouth at bedtime.      Historical Provider, MD    Family History Family History  Problem Relation Age of Onset  . Pneumonia Father   . Coronary artery disease Mother   . Stroke Mother   . Colon cancer Mother 61  . Thalassemia Sister     Social History Social History  Substance Use Topics  . Smoking status: Never Smoker  . Smokeless tobacco: Not on file  . Alcohol use Yes     Comment: occasionally      Allergies   Procaine hcl; Sulfonamide derivatives; and Penicillins   Review of Systems Review of Systems  Cardiovascular: Negative for chest pain.  Allergic/Immunologic: Negative for immunocompromised state.  Neurological: Negative for syncope.  All other systems reviewed and are negative.    Physical Exam Updated Vital Signs BP (!) 146/57 (BP Location: Right Arm)   Pulse 84   Temp 99.5 F (37.5 C) (Oral)   Resp 18   Ht 5\' 2"  (1.575 m)   Wt 64.4 kg   SpO2 99%   BMI 25.97 kg/m   Physical Exam  Constitutional: She appears well-developed and well-nourished. No distress.  HENT:  Head: Normocephalic and atraumatic.  Eyes: Conjunctivae are normal.  Neck: Neck supple.  Cardiovascular: Normal rate and regular rhythm.   No murmur heard. Pulmonary/Chest: Effort normal and breath sounds normal. No respiratory distress.  Abdominal: Soft. There is no tenderness.  Musculoskeletal: She exhibits no edema.  LLE: 2+ DP and PT pulse Normal sensation along entire LE 5/5 strength at dorsi and plantarflexion Limited rom of hip secondary to pain, with guarding <2 sec cap refill  No tenderness along rest of MSK system including head, spine, abdomen/chest, rest of  extremities  Neurological: She is alert.  Skin: Skin is warm and dry.  Psychiatric: She has a normal mood and affect.  Nursing note and vitals reviewed.    ED Treatments / Results  Labs (all labs ordered are listed, but only abnormal results are displayed) Labs Reviewed  CBC WITH DIFFERENTIAL/PLATELET - Abnormal; Notable for the following:       Result Value   WBC 13.8 (*)    Hemoglobin 10.5 (*)    HCT 30.8 (*)    MCV 72.8 (*)    MCH 24.8 (*)    RDW 21.0 (*)    Neutro Abs 11.3 (*)  All other components within normal limits  I-STAT CHEM 8, ED - Abnormal; Notable for the following:    Potassium 3.3 (*)    Glucose, Bld 150 (*)    Calcium, Ion 1.01 (*)    Hemoglobin 10.5 (*)    HCT 31.0 (*)    All other components within normal limits  PROTIME-INR  CBC  BASIC METABOLIC PANEL  TYPE AND SCREEN  ABO/RH    EKG  EKG Interpretation None       Radiology Dg Chest 1 View  Result Date: 09/05/2016 CLINICAL DATA:  Preoperative chest radiograph. Left hip deformity. Initial encounter. EXAM: CHEST 1 VIEW COMPARISON:  Chest radiograph performed 12/19/2009 FINDINGS: The lungs are well-aerated. Peribronchial thickening is noted. A calcified granuloma is noted at the left lung base. There is no evidence of focal opacification, pleural effusion or pneumothorax. The cardiomediastinal silhouette is mildly enlarged. No acute osseous abnormalities are seen. IMPRESSION: Peribronchial thickening noted. Mild cardiomegaly. No displaced rib fracture seen. Electronically Signed   By: Garald Balding M.D.   On: 09/05/2016 20:20   Ct Head Wo Contrast  Result Date: 09/05/2016 CLINICAL DATA:  Patient fell on the way of the bathroom. EXAM: CT HEAD WITHOUT CONTRAST TECHNIQUE: Contiguous axial images were obtained from the base of the skull through the vertex without intravenous contrast. COMPARISON:  12/19/2009 FINDINGS: Brain: There is no evidence for acute hemorrhage, hydrocephalus, mass lesion, or  abnormal extra-axial fluid collection. No definite CT evidence for acute infarction. Diffuse loss of parenchymal volume is consistent with atrophy. Patchy low attenuation in the deep hemispheric and periventricular white matter is nonspecific, but likely reflects chronic microvascular ischemic demyelination. Vascular: Atherosclerotic calcification is visualized in the carotid arteries. No dense MCA sign. Major dural sinuses are unremarkable. Skull: No evidence for fracture. No worrisome lytic or sclerotic lesion. Sinuses/Orbits: The visualized paranasal sinuses and mastoid air cells are clear. Visualized portions of the globes and intraorbital fat are unremarkable. Other: None. IMPRESSION: 1. Stable exam.  No acute intracranial abnormality. 2. Atrophy with chronic small vessel white matter ischemic disease. Electronically Signed   By: Misty Stanley M.D.   On: 09/05/2016 20:32   Dg Hip Unilat With Pelvis 2-3 Views Left  Result Date: 09/05/2016 CLINICAL DATA:  Left hip deformity after fall. EXAM: DG HIP (WITH OR WITHOUT PELVIS) 2-3V LEFT; LEFT FEMUR 2 VIEWS COMPARISON:  None. FINDINGS: Left hip: Intertrochanteric left proximal femur fracture with moderate coxa vara deformity. No left hip dislocation. Lucency in the lateral aspect of the left symphysis pubis may represent a nondisplaced fracture. Advanced degenerative changes of the lower lumbar spine. Left femur: Intertrochanteric left proximal femur fracture with moderate coxa vara deformity. The left knee joint is maintained. No additional femoral fracture. IMPRESSION: Intertrochanteric left proximal femur fracture with moderate coxa vara deformity. No left hip dislocation. Probable nondisplaced fracture of left pubis symphysis. Electronically Signed   By: Kristine Garbe M.D.   On: 09/05/2016 20:23   Dg Femur Min 2 Views Left  Result Date: 09/05/2016 CLINICAL DATA:  Left hip deformity after fall. EXAM: DG HIP (WITH OR WITHOUT PELVIS) 2-3V LEFT;  LEFT FEMUR 2 VIEWS COMPARISON:  None. FINDINGS: Left hip: Intertrochanteric left proximal femur fracture with moderate coxa vara deformity. No left hip dislocation. Lucency in the lateral aspect of the left symphysis pubis may represent a nondisplaced fracture. Advanced degenerative changes of the lower lumbar spine. Left femur: Intertrochanteric left proximal femur fracture with moderate coxa vara deformity. The left knee joint is maintained.  No additional femoral fracture. IMPRESSION: Intertrochanteric left proximal femur fracture with moderate coxa vara deformity. No left hip dislocation. Probable nondisplaced fracture of left pubis symphysis. Electronically Signed   By: Kristine Garbe M.D.   On: 09/05/2016 20:23    Procedures Procedures (including critical care time)  Medications Ordered in ED Medications  metoprolol tartrate (LOPRESSOR) tablet 12.5 mg (12.5 mg Oral Given 09/05/16 2303)  simvastatin (ZOCOR) tablet 40 mg (40 mg Oral Given 09/05/16 2304)  amitriptyline (ELAVIL) tablet 10 mg (10 mg Oral Given 09/05/16 2304)  hydrALAZINE (APRESOLINE) injection 10-20 mg (20 mg Intravenous Given 09/05/16 2111)  HYDROcodone-acetaminophen (NORCO/VICODIN) 5-325 MG per tablet 1-2 tablet (2 tablets Oral Given 09/05/16 2346)  morphine 4 MG/ML injection 0.52 mg (0.52 mg Intravenous Given 09/05/16 2343)  bupivacaine (PF) (MARCAINE) 0.25 % injection 30 mL (30 mLs Infiltration Not Given 09/05/16 2306)  irbesartan (AVAPRO) tablet 300 mg (not administered)    And  hydrochlorothiazide (MICROZIDE) capsule 12.5 mg (not administered)  morphine 4 MG/ML injection 6 mg (6 mg Intravenous Given 09/05/16 2024)  ondansetron (ZOFRAN) injection 4 mg (4 mg Intravenous Given 09/05/16 2024)  acetaminophen (TYLENOL) tablet 1,000 mg (1,000 mg Oral Given 09/05/16 2109)  heparin injection 5,000 Units (0 Units Subcutaneous Duplicate [id number])  potassium chloride SA (K-DUR,KLOR-CON) CR tablet 20 mEq (20 mEq Oral Given 09/05/16  2307)     Initial Impression / Assessment and Plan / ED Course  I have reviewed the triage vital signs and the nursing notes.  Pertinent labs & imaging results that were available during my care of the patient were reviewed by me and considered in my medical decision making (see chart for details).     XR with femur fx, ?pelvic fx Hemoglobin stable, VSS Morphine caused pt to become hypoxic, improved after o2 supp Pt feels better, pain controlled - offered femoral n. Block but she feels her pain is currently controlled now Labs ok, mechanical fx CTH, CXR negative Spoke with ortho, NPO in am Hospitalist will admit   Final Clinical Impressions(s) / ED Diagnoses   Final diagnoses:  Fall  Closed fracture of left hip, initial encounter [facility])    New Prescriptions Current Discharge Medication List       Karma Greaser, MD 09/06/16 0011    Varney Biles, MD 09/06/16 [phone]

## 2016-09-06 ENCOUNTER — Encounter (HOSPITAL_COMMUNITY): Payer: Self-pay | Admitting: Anesthesiology

## 2016-09-06 ENCOUNTER — Inpatient Hospital Stay (HOSPITAL_COMMUNITY): Payer: Medicare Other

## 2016-09-06 ENCOUNTER — Inpatient Hospital Stay (HOSPITAL_COMMUNITY): Payer: Medicare Other | Admitting: Certified Registered Nurse Anesthetist

## 2016-09-06 ENCOUNTER — Encounter (HOSPITAL_COMMUNITY)
Admission: EM | Disposition: A | Discharge status: Skilled Nursing Facility | Payer: Self-pay | Source: Home / Self Care | Attending: Internal Medicine

## 2016-09-06 DIAGNOSIS — S72142A Displaced intertrochanteric fracture of left femur, initial encounter for closed fracture: Principal | ICD-10-CM

## 2016-09-06 HISTORY — PX: INTRAMEDULLARY (IM) NAIL INTERTROCHANTERIC: SHX5875

## 2016-09-06 LAB — TYPE AND SCREEN
ABO/RH(D): A POS
ANTIBODY SCREEN: NEGATIVE
DAT, IgG: NEGATIVE
PT AG Type: POSITIVE

## 2016-09-06 LAB — GLUCOSE, CAPILLARY
GLUCOSE-CAPILLARY: 130 mg/dL — AB (ref 65–99)
Glucose-Capillary: 129 mg/dL — ABNORMAL HIGH (ref 65–99)
Glucose-Capillary: 157 mg/dL — ABNORMAL HIGH (ref 65–99)

## 2016-09-06 LAB — CBC
HEMATOCRIT: 29.3 % — AB (ref 36.0–46.0)
Hemoglobin: 10.1 g/dL — ABNORMAL LOW (ref 12.0–15.0)
MCH: 25.6 pg — ABNORMAL LOW (ref 26.0–34.0)
MCHC: 34.5 g/dL (ref 30.0–36.0)
MCV: 74.2 fL — AB (ref 78.0–100.0)
Platelets: 204 10*3/uL (ref 150–400)
RBC: 3.95 MIL/uL (ref 3.87–5.11)
RDW: 19.7 % — AB (ref 11.5–15.5)
WBC: 13.4 10*3/uL — AB (ref 4.0–10.5)

## 2016-09-06 LAB — BASIC METABOLIC PANEL
ANION GAP: 9 (ref 5–15)
BUN: 11 mg/dL (ref 6–20)
CALCIUM: 8.5 mg/dL — AB (ref 8.9–10.3)
CO2: 27 mmol/L (ref 22–32)
Chloride: 103 mmol/L (ref 101–111)
Creatinine, Ser: 0.77 mg/dL (ref 0.44–1.00)
GFR calc Af Amer: >60 mL/min (ref >60)
GFR calc non Af Amer: >60 mL/min (ref >60)
GLUCOSE: 159 mg/dL — AB (ref 65–99)
Potassium: 3.6 mmol/L (ref 3.5–5.1)
Sodium: 139 mmol/L (ref 135–145)

## 2016-09-06 LAB — SURGICAL PCR SCREEN
MRSA, PCR: NEGATIVE
STAPHYLOCOCCUS AUREUS: NEGATIVE

## 2016-09-06 SURGERY — FIXATION, FRACTURE, INTERTROCHANTERIC, WITH INTRAMEDULLARY ROD
Anesthesia: Spinal | Site: Hip | Laterality: Left

## 2016-09-06 MED ORDER — SENNA 8.6 MG PO TABS
1.0000 | ORAL_TABLET | Freq: Two times a day (BID) | ORAL | Status: DC
Start: 2016-09-06 — End: 2016-09-09
  Administered 2016-09-06 – 2016-09-09 (×6): 8.6 mg via ORAL
  Filled 2016-09-06 (×6): qty 1

## 2016-09-06 MED ORDER — EPHEDRINE SULFATE 50 MG/ML IJ SOLN
INTRAMUSCULAR | Status: DC | PRN
  Administered 2016-09-06: 15 mg via INTRAVENOUS

## 2016-09-06 MED ORDER — FENTANYL CITRATE (PF) 100 MCG/2ML IJ SOLN
INTRAMUSCULAR | Status: AC
  Filled 2016-09-06: qty 2

## 2016-09-06 MED ORDER — GLUCERNA SHAKE PO LIQD
237.0000 mL | Freq: Two times a day (BID) | ORAL | Status: DC
  Administered 2016-09-07 – 2016-09-09 (×3): 237 mL via ORAL

## 2016-09-06 MED ORDER — POLYETHYLENE GLYCOL 3350 17 G PO PACK: 17.0000 g | PACK | Freq: Every day | ORAL | Status: DC | PRN

## 2016-09-06 MED ORDER — BACLOFEN 10 MG PO TABS: 10.0000 mg | ORAL_TABLET | Freq: Three times a day (TID) | ORAL | 0 refills | Status: DC | PRN

## 2016-09-06 MED ORDER — METOCLOPRAMIDE HCL 5 MG PO TABS: 5.0000 mg | ORAL_TABLET | Freq: Three times a day (TID) | ORAL | Status: DC | PRN

## 2016-09-06 MED ORDER — CEFAZOLIN SODIUM-DEXTROSE 2-4 GM/100ML-% IV SOLN
2.0000 g | INTRAVENOUS | Status: AC
  Administered 2016-09-06: 2 g via INTRAVENOUS

## 2016-09-06 MED ORDER — PROPOFOL 1000 MG/100ML IV EMUL
INTRAVENOUS | Status: AC
  Filled 2016-09-06: qty 300

## 2016-09-06 MED ORDER — FENTANYL CITRATE (PF) 100 MCG/2ML IJ SOLN
INTRAMUSCULAR | Status: AC
  Filled 2016-09-06: qty 4

## 2016-09-06 MED ORDER — ACETAMINOPHEN 325 MG PO TABS
650.0000 mg | ORAL_TABLET | Freq: Four times a day (QID) | ORAL | Status: DC | PRN
Start: 2016-09-06 — End: 2016-09-09
  Administered 2016-09-08: 650 mg via ORAL
  Filled 2016-09-06 (×2): qty 2

## 2016-09-06 MED ORDER — CEFAZOLIN SODIUM-DEXTROSE 2-4 GM/100ML-% IV SOLN
INTRAVENOUS | Status: DC
Start: 2016-09-06 — End: 2016-09-06
  Filled 2016-09-06: qty 100

## 2016-09-06 MED ORDER — ACETAMINOPHEN 650 MG RE SUPP
650.0000 mg | Freq: Four times a day (QID) | RECTAL | Status: DC | PRN
Start: 2016-09-06 — End: 2016-09-09

## 2016-09-06 MED ORDER — PHENYLEPHRINE HCL 10 MG/ML IJ SOLN
INTRAVENOUS | Status: DC | PRN
  Administered 2016-09-06: 20 ug/min via INTRAVENOUS

## 2016-09-06 MED ORDER — PROPOFOL 10 MG/ML IV BOLUS
INTRAVENOUS | Status: AC
Start: 2016-09-06 — End: 2016-09-06
  Filled 2016-09-06: qty 20

## 2016-09-06 MED ORDER — METOCLOPRAMIDE HCL 5 MG/ML IJ SOLN: 5.0000 mg | Freq: Three times a day (TID) | INTRAMUSCULAR | Status: DC | PRN

## 2016-09-06 MED ORDER — FENTANYL CITRATE (PF) 100 MCG/2ML IJ SOLN
INTRAMUSCULAR | Status: DC | PRN
  Administered 2016-09-06 (×2): 50 ug via INTRAVENOUS

## 2016-09-06 MED ORDER — POVIDONE-IODINE 10 % EX SWAB: 2.0000 "application " | Freq: Once | CUTANEOUS | Status: DC

## 2016-09-06 MED ORDER — BACLOFEN 10 MG PO TABS: 10.0000 mg | ORAL_TABLET | Freq: Three times a day (TID) | ORAL | Status: DC | PRN

## 2016-09-06 MED ORDER — ONDANSETRON HCL 4 MG/2ML IJ SOLN: 4.0000 mg | Freq: Once | INTRAMUSCULAR | Status: DC | PRN

## 2016-09-06 MED ORDER — FENTANYL CITRATE (PF) 100 MCG/2ML IJ SOLN
25.0000 ug | INTRAMUSCULAR | Status: DC | PRN
  Administered 2016-09-06 (×2): 50 ug via INTRAVENOUS

## 2016-09-06 MED ORDER — HYDROCODONE-ACETAMINOPHEN 5-325 MG PO TABS
1.0000 | ORAL_TABLET | ORAL | Status: DC | PRN
  Administered 2016-09-07 – 2016-09-09 (×5): 1 via ORAL
  Filled 2016-09-06 (×6): qty 1

## 2016-09-06 MED ORDER — ENOXAPARIN SODIUM 40 MG/0.4ML ~~LOC~~ SOLN: 40.0000 mg | SUBCUTANEOUS | 0 refills | Status: DC

## 2016-09-06 MED ORDER — MEPERIDINE HCL 25 MG/ML IJ SOLN: 6.2500 mg | INTRAMUSCULAR | Status: DC | PRN

## 2016-09-06 MED ORDER — PROPOFOL 500 MG/50ML IV EMUL
INTRAVENOUS | Status: DC | PRN
  Administered 2016-09-06: 20 ug/kg/min via INTRAVENOUS

## 2016-09-06 MED ORDER — PHENYLEPHRINE 40 MCG/ML (10ML) SYRINGE FOR IV PUSH (FOR BLOOD PRESSURE SUPPORT)
PREFILLED_SYRINGE | INTRAVENOUS | Status: AC
  Filled 2016-09-06: qty 10

## 2016-09-06 MED ORDER — 0.9 % SODIUM CHLORIDE (POUR BTL) OPTIME
TOPICAL | Status: DC | PRN
  Administered 2016-09-06: 1000 mL

## 2016-09-06 MED ORDER — ENOXAPARIN SODIUM 40 MG/0.4ML ~~LOC~~ SOLN
40.0000 mg | SUBCUTANEOUS | Status: DC
  Administered 2016-09-07 – 2016-09-08 (×2): 40 mg via SUBCUTANEOUS
  Filled 2016-09-06 (×2): qty 0.4

## 2016-09-06 MED ORDER — BUPIVACAINE IN DEXTROSE 0.75-8.25 % IT SOLN
INTRATHECAL | Status: DC | PRN
  Administered 2016-09-06: 1.6 mL via INTRATHECAL

## 2016-09-06 MED ORDER — MEMANTINE HCL 10 MG PO TABS
10.0000 mg | ORAL_TABLET | Freq: Two times a day (BID) | ORAL | Status: DC
  Administered 2016-09-06 – 2016-09-09 (×6): 10 mg via ORAL
  Filled 2016-09-06 (×6): qty 1

## 2016-09-06 MED ORDER — ONDANSETRON HCL 4 MG/2ML IJ SOLN
INTRAMUSCULAR | Status: AC
  Filled 2016-09-06: qty 2

## 2016-09-06 MED ORDER — PROPOFOL 10 MG/ML IV BOLUS
INTRAVENOUS | Status: DC | PRN
  Administered 2016-09-06 (×2): 20 mg via INTRAVENOUS
  Administered 2016-09-06: 10 mg via INTRAVENOUS

## 2016-09-06 MED ORDER — SORBITOL 70 % SOLN: 30.0000 mL | Freq: Every day | Status: DC | PRN

## 2016-09-06 MED ORDER — LACTATED RINGERS IV SOLN
INTRAVENOUS | Status: DC
  Administered 2016-09-06 (×2): via INTRAVENOUS

## 2016-09-06 MED ORDER — DONEPEZIL HCL 5 MG PO TABS
5.0000 mg | ORAL_TABLET | Freq: Every day | ORAL | Status: DC
  Administered 2016-09-06 – 2016-09-08 (×3): 5 mg via ORAL
  Filled 2016-09-06 (×3): qty 1

## 2016-09-06 MED ORDER — ONDANSETRON HCL 4 MG/2ML IJ SOLN: 4.0000 mg | Freq: Four times a day (QID) | INTRAMUSCULAR | Status: DC | PRN

## 2016-09-06 MED ORDER — HYDROCODONE-ACETAMINOPHEN 5-325 MG PO TABS: 1.0000 | ORAL_TABLET | Freq: Four times a day (QID) | ORAL | 0 refills | Status: DC | PRN

## 2016-09-06 MED ORDER — METOPROLOL TARTRATE 25 MG PO TABS
25.0000 mg | ORAL_TABLET | Freq: Two times a day (BID) | ORAL | Status: DC
  Administered 2016-09-06 – 2016-09-09 (×6): 25 mg via ORAL
  Filled 2016-09-06 (×6): qty 1

## 2016-09-06 MED ORDER — DOCUSATE SODIUM 100 MG PO CAPS
100.0000 mg | ORAL_CAPSULE | Freq: Two times a day (BID) | ORAL | Status: DC
  Administered 2016-09-06 – 2016-09-07 (×3): 100 mg via ORAL
  Filled 2016-09-06 (×3): qty 1

## 2016-09-06 MED ORDER — ONDANSETRON HCL 4 MG PO TABS: 4.0000 mg | ORAL_TABLET | Freq: Four times a day (QID) | ORAL | Status: DC | PRN

## 2016-09-06 MED ORDER — CHLORHEXIDINE GLUCONATE 4 % EX LIQD: 60.0000 mL | Freq: Once | CUTANEOUS | Status: DC

## 2016-09-06 MED ORDER — VANCOMYCIN HCL IN DEXTROSE 1-5 GM/200ML-% IV SOLN
INTRAVENOUS | Status: AC
  Filled 2016-09-06: qty 200

## 2016-09-06 MED ORDER — ONDANSETRON HCL 4 MG PO TABS: 4.0000 mg | ORAL_TABLET | Freq: Three times a day (TID) | ORAL | 0 refills | Status: DC | PRN

## 2016-09-06 MED ORDER — MAGNESIUM CITRATE PO SOLN: 1.0000 | Freq: Once | ORAL | Status: DC | PRN

## 2016-09-06 SURGICAL SUPPLY — 38 items
BIT DRILL AO GAMMA 4.2X180 (BIT) ×2 IMPLANT
BNDG COHESIVE 4X5 TAN STRL (GAUZE/BANDAGES/DRESSINGS) ×3 IMPLANT
BNDG GAUZE ELAST 4 BULKY (GAUZE/BANDAGES/DRESSINGS) ×3 IMPLANT
CLOSURE STERI-STRIP 1/2X4 (GAUZE/BANDAGES/DRESSINGS) ×3
CLSR STERI-STRIP ANTIMIC 1/2X4 (GAUZE/BANDAGES/DRESSINGS) ×3 IMPLANT
COVER PERINEAL POST (MISCELLANEOUS) ×3 IMPLANT
COVER SURGICAL LIGHT HANDLE (MISCELLANEOUS) ×3 IMPLANT
DRAPE STERI IOBAN 125X83 (DRAPES) ×3 IMPLANT
DRSG MEPILEX BORDER 4X4 (GAUZE/BANDAGES/DRESSINGS) ×8 IMPLANT
DURAPREP 26ML APPLICATOR (WOUND CARE) ×3 IMPLANT
ELECT REM PT RETURN 9FT ADLT (ELECTROSURGICAL) ×3
ELECTRODE REM PT RTRN 9FT ADLT (ELECTROSURGICAL) ×1 IMPLANT
GLOVE BIO SURGEON STRL SZ7.5 (GLOVE) ×6 IMPLANT
GLOVE BIOGEL PI IND STRL 8 (GLOVE) ×2 IMPLANT
GLOVE BIOGEL PI INDICATOR 8 (GLOVE) ×4
GOWN STRL REUS W/ TWL LRG LVL3 (GOWN DISPOSABLE) ×3 IMPLANT
GOWN STRL REUS W/TWL LRG LVL3 (GOWN DISPOSABLE) ×9
GUIDEROD T2 3X1000 (ROD) ×2 IMPLANT
K-WIRE  3.2X450M STR (WIRE) ×2
K-WIRE 3.2X450M STR (WIRE) ×1
KIT ROOM TURNOVER OR (KITS) ×3 IMPLANT
KWIRE 3.2X450M STR (WIRE) IMPLANT
MANIFOLD NEPTUNE II (INSTRUMENTS) ×3 IMPLANT
NAIL LONG RT 1.5 10X340-125 (Nail) ×2 IMPLANT
NS IRRIG 1000ML POUR BTL (IV SOLUTION) ×3 IMPLANT
PACK GENERAL/GYN (CUSTOM PROCEDURE TRAY) ×3 IMPLANT
PAD ARMBOARD 7.5X6 YLW CONV (MISCELLANEOUS) ×3 IMPLANT
PAD CAST 4YDX4 CTTN HI CHSV (CAST SUPPLIES) ×1 IMPLANT
PADDING CAST COTTON 4X4 STRL (CAST SUPPLIES) ×3
SCREW LAG GAMMA 3 TI 10.5X100M (Screw) ×2 IMPLANT
SCREW LOCKING T2 F/T  5X37.5MM (Screw) ×2 IMPLANT
SCREW LOCKING T2 F/T 5X37.5MM (Screw) IMPLANT
SUT MNCRL AB 4-0 PS2 18 (SUTURE) IMPLANT
SUT MON AB 2-0 CT1 27 (SUTURE) ×3 IMPLANT
SUT VIC AB 0 CT1 27 (SUTURE) ×3
SUT VIC AB 0 CT1 27XBRD ANBCTR (SUTURE) ×1 IMPLANT
TOWEL OR 17X24 6PK STRL BLUE (TOWEL DISPOSABLE) ×3 IMPLANT
TOWEL OR 17X26 10 PK STRL BLUE (TOWEL DISPOSABLE) ×3 IMPLANT

## 2016-09-06 NOTE — Anesthesia Postprocedure Evaluation (Signed)
Anesthesia Post Note  Patient: [name]  Procedure(s) Performed: Procedure(s) (LRB): LEFT HIP INTRAMEDULLARY (IM) NAIL (Left)  Patient location during evaluation: PACU Anesthesia Type: Spinal Level of consciousness: awake and alert Pain management: pain level controlled Vital Signs Assessment: post-procedure vital signs reviewed and stable Respiratory status: spontaneous breathing, respiratory function stable, nonlabored ventilation and patient connected to nasal cannula oxygen Cardiovascular status: blood pressure returned to baseline and stable Postop Assessment: no headache, no backache, patient able to bend at knees, no signs of nausea or vomiting and spinal receding Anesthetic complications: no       Last Vitals:  Vitals:   09/06/16 1635 09/06/16 1650  BP: (!) 166/88 (!) 163/86  Pulse: 89 90  Resp: 18 13  Temp:      Last Pain:  Vitals:   09/06/16 1659  TempSrc:   PainSc: Asleep    LLE Motor Response: Purposeful movement;Responds to commands (09/06/16 1650) LLE Sensation: Decreased (09/06/16 1650) RLE Motor Response: Purposeful movement;Responds to commands (09/06/16 1650) RLE Sensation: Decreased (09/06/16 1650) L Sensory Level: S1-Sole of foot, small toes (09/06/16 1650) R Sensory Level: S1-Sole of foot, small toes (09/06/16 1650)  [name] A.

## 2016-09-06 NOTE — Anesthesia Preprocedure Evaluation (Signed)
Anesthesia Evaluation  Patient identified by MRN, date of birth, ID band Patient awake    Reviewed: Allergy & Precautions, NPO status , Patient's Chart, lab work & pertinent test results, reviewed documented beta blocker date and time   Airway Mallampati: III       Dental no notable dental hx. (+) Edentulous Upper, Edentulous Lower   Pulmonary neg pulmonary ROS,    Pulmonary exam normal breath sounds clear to auscultation       Cardiovascular hypertension, Pt. on medications and Pt. on home beta blockers Normal cardiovascular exam+ dysrhythmias Atrial Fibrillation  Rhythm:Regular Rate:Normal     Neuro/Psych PSYCHIATRIC DISORDERS Dementia   GI/Hepatic Neg liver ROS,   Endo/Other  diabetes, Well Controlled, Type 2, Oral Hypoglycemic AgentsHyperthyroidism Hyperlipidemia  Renal/GU negative Renal ROS  negative genitourinary   Musculoskeletal Intertrochanteric left hip Fx Osteoporosis   Abdominal   Peds  Hematology  (+) anemia , Thalassemia   Anesthesia Other Findings   Reproductive/Obstetrics                             Anesthesia Physical Anesthesia Plan  ASA: III  Anesthesia Plan: Spinal   Post-op Pain Management:    Induction:   Airway Management Planned: Natural Airway and Nasal Cannula  Additional Equipment:   Intra-op Plan:   Post-operative Plan:   Informed Consent: I have reviewed the patients History and Physical, chart, labs and discussed the procedure including the risks, benefits and alternatives for the proposed anesthesia with the patient or authorized representative who has indicated his/her understanding and acceptance.   Dental advisory given  Plan Discussed with: Anesthesiologist  Anesthesia Plan Comments:         Anesthesia Quick Evaluation

## 2016-09-06 NOTE — Progress Notes (Signed)
Orthopedic Tech Progress Note Patient Details:  [name] 1929-03-27 [id number]  Ortho Devices Type of Ortho Device: Other (comment) Ortho Device/Splint Location: Because of pt Age Ortho Tech Refused to supply Trapeze Bar to prevent Arm injury. (ortho tech).   Kristopher Oppenheim 09/06/2016, 8:17 PM

## 2016-09-06 NOTE — Transfer of Care (Signed)
Immediate Anesthesia Transfer of Care Note  Patient: [name]  Procedure(s) Performed: Procedure(s): LEFT HIP INTRAMEDULLARY (IM) NAIL (Left)  Patient Location: PACU  Anesthesia Type:MAC and Spinal  Level of Consciousness: awake, confused and responds to stimulation  Airway & Oxygen Therapy: Patient Spontanous Breathing and Patient connected to nasal cannula oxygen  Post-op Assessment: Report given to RN and Post -op Vital signs reviewed and stable  Post vital signs: Reviewed and stable  Last Vitals:  Vitals:   09/06/16 0842 09/06/16 1620  BP: (!) 158/61 (!) 167/79  Pulse: 70 85  Resp: 16 12  Temp: 36.8 C 36.4 C    Last Pain:  Vitals:   09/06/16 1052  TempSrc:   PainSc: Asleep      Patients Stated Pain Goal: 3 (40/76/80 8811)  Complications: No apparent anesthesia complications

## 2016-09-06 NOTE — Op Note (Signed)
DATE OF SURGERY:  09/06/2016  TIME: 3:55 PM  PATIENT NAME:  [name]  AGE: 83 y.o.  PRE-OPERATIVE DIAGNOSIS:  Left Hip Fracture  POST-OPERATIVE DIAGNOSIS:  SAME  PROCEDURE:  LEFT HIP INTRAMEDULLARY (IM) NAIL  SURGEON:  Odelle Kosier D  ASSISTANT:  Roxan Hockey, PA-C, he was present and scrubbed throughout the case, critical for completion in a timely fashion, and for retraction, instrumentation, and closure.   OPERATIVE IMPLANTS: Stryker Gamma Nail  PREOPERATIVE INDICATIONS:  [name] is a 83 y.o. year old who fell and suffered a hip fracture. She was brought into the ER and then admitted and optimized and then elected for surgical intervention.    The risks benefits and alternatives were discussed with the patient including but not limited to the risks of nonoperative treatment, versus surgical intervention including infection, bleeding, nerve injury, malunion, nonunion, hardware prominence, hardware failure, need for hardware removal, blood clots, cardiopulmonary complications, morbidity, mortality, among others, and they were willing to proceed.    OPERATIVE PROCEDURE:  The patient was brought to the operating room and placed in the supine position. General anesthesia was administered. She was placed on the fracture table.  Closed reduction was performed under C-arm guidance. Time out was then performed after sterile prep and drape. She received preoperative antibiotics.  Incision was made proximal to the greater trochanter. A guidewire was placed in the appropriate position. Confirmation was made on AP and lateral views. The above-named nail was opened. I opened the proximal femur with a reamer. I then placed the nail by hand easily down. I did not need to ream the femur.  Once the nail was completely seated, I placed a guidepin into the femoral head into the center center position. I measured the length, and then reamed the lateral cortex and up into the  head. I then placed the lag screw. Slight compression was applied. Anatomic fixation achieved. Bone quality was mediocre.  I then secured the proximal interlocking bolt, and took off a half a turn, and then removed the instruments, and took final C-arm pictures AP and lateral the entire length of the leg.  I then used perfect circles technique to place a distal interlock screw.   Anatomic reconstruction was achieved, and the wounds were irrigated copiously and closed with Vicryl followed by staples and sterile gauze for the skin. The patient was awakened and returned to PACU in stable and satisfactory condition. There no complications and the patient tolerated the procedure well.  She will be weightbearing as tolerated, and will be on chemical px  for a period of four weeks after discharge.   [name], M.D.

## 2016-09-06 NOTE — Progress Notes (Signed)
PROGRESS NOTE    [name]  [id number] DOB: 1929/02/09 DOA: 09/05/2016 PCP: Glo Herring., MD  Brief Narrative: [name] is a 83 y.o. female with medical history significant of HTN, dementia with short term memory loss.  Patient [place] park.  L hip pain and LLE deformity post fall.  Xray revealed L intertroch fx of hip.  Assessment & Plan:   1. L hip fx - -low cardiac risk for surgery -plan for OR today, will need DVT proph post op -PT/SNF for rehab  2. HTN -hold ARB and hctz -resume metoprolol  3. Dementia -stable, resides at ALF           -resume namenda and aricept  DVT prophylaxis: start lovenox post op Code Status: Full Family Communication: son at bedside  Consultants:   Ortho   Procedures:    Subjective: Feels ok, no complaints  Objective: Vitals:   09/05/16 2130 09/05/16 2239 09/06/16 0553 09/06/16 0842  BP: 147/66 (!) 146/57 (!) 145/62 (!) 158/61  Pulse: 89 84 68 70  Resp: 16 18 16 16   Temp:  99.5 F (37.5 C) 98.2 F (36.8 C) 98.3 F (36.8 C)  TempSrc:  Oral Oral Oral  SpO2: 93% 99% 98% 97%  Weight:      Height:       No intake or output data in the 24 hours ending 09/06/16 1044 Filed Weights   09/05/16 1935  Weight: 64.4 kg (142 lb)    Examination:  General exam: Appears calm and comfortable, AAOx3, oriented to self and place only, pleasant  Respiratory system: Clear to auscultation. Respiratory effort normal. Cardiovascular system: S1 & S2 heard, RRR. No JVD, murmurs, rubs, gallops or clicks. No pedal edema. Gastrointestinal system: Abdomen is nondistended, soft and nontender. Normal bowel sounds heard. Central nervous system: Alert and oriented x2. No focal neurological deficits. Extremities: Symmetric 5 x 5 power. Skin: No rashes, lesions or ulcers Psychiatry: Judgement and insight appear normal. Mood & affect appropriate.     Data Reviewed: I have personally reviewed following  labs and imaging studies  CBC:  Recent Labs Lab 09/05/16 2027 09/05/16 2048 09/06/16 0314  WBC 13.8*  --  13.4*  NEUTROABS 11.3*  --   --   HGB 10.5* 10.5* 10.1*  HCT 30.8* 31.0* 29.3*  MCV 72.8*  --  74.2*  PLT 186  --  0000000   Basic Metabolic Panel:  Recent Labs Lab 09/05/16 2048 09/06/16 0314  NA 141 139  K 3.3* 3.6  CL 105 103  CO2  --  27  GLUCOSE 150* 159*  BUN 10 11  CREATININE 0.70 0.77  CALCIUM  --  8.5*   GFR: Estimated Creatinine Clearance: 44.5 mL/min (by C-G formula based on SCr of 0.77 mg/dL). Liver Function Tests: No results for input(s): AST, ALT, ALKPHOS, BILITOT, PROT, ALBUMIN in the last 168 hours. No results for input(s): LIPASE, AMYLASE in the last 168 hours. No results for input(s): AMMONIA in the last 168 hours. Coagulation Profile:  Recent Labs Lab 09/05/16 2031  INR 1.06   Cardiac Enzymes: No results for input(s): CKTOTAL, CKMB, CKMBINDEX, TROPONINI in the last 168 hours. BNP (last 3 results) No results for input(s): PROBNP in the last 8760 hours. HbA1C: No results for input(s): HGBA1C in the last 72 hours. CBG: No results for input(s): GLUCAP in the last 168 hours. Lipid Profile: No results for input(s): CHOL, HDL, LDLCALC, TRIG, CHOLHDL, LDLDIRECT in the last 72 hours.  Thyroid Function Tests: No results for input(s): TSH, T4TOTAL, FREET4, T3FREE, THYROIDAB in the last 72 hours. Anemia Panel: No results for input(s): VITAMINB12, FOLATE, FERRITIN, TIBC, IRON, RETICCTPCT in the last 72 hours. Urine analysis:    Component Value Date/Time   COLORURINE YELLOW 11/21/2015 1550   APPEARANCEUR CLEAR 11/21/2015 1550   LABSPEC <1.005 (L) 11/21/2015 1550   PHURINE 5.5 11/21/2015 1550   GLUCOSEU NEGATIVE 11/21/2015 1550   HGBUR NEGATIVE 11/21/2015 1550   BILIRUBINUR NEGATIVE 11/21/2015 1550   KETONESUR NEGATIVE 11/21/2015 1550   PROTEINUR NEGATIVE 11/21/2015 1550   UROBILINOGEN 0.2 01/18/2011 2222   NITRITE NEGATIVE 11/21/2015 1550    LEUKOCYTESUR SMALL (A) 11/21/2015 1550   Sepsis Labs: @LABRCNTIP (procalcitonin:4,lacticidven:4)  ) Recent Results (from the past 240 hour(s))  Surgical pcr screen     Status: None   Collection Time: 09/06/16 12:18 AM  Result Value Ref Range Status   MRSA, PCR NEGATIVE NEGATIVE Final   Staphylococcus aureus NEGATIVE NEGATIVE Final    Comment:        The Xpert SA Assay (FDA approved for NASAL specimens in patients over 64 years of age), is one component of a comprehensive surveillance program.  Test performance has been validated by [facility] for patients greater than or equal to 37 year old. It is not intended to diagnose infection nor to guide or monitor treatment.          Radiology Studies: Dg Chest 1 View  Result Date: 09/05/2016 CLINICAL DATA:  Preoperative chest radiograph. Left hip deformity. Initial encounter. EXAM: CHEST 1 VIEW COMPARISON:  Chest radiograph performed 12/19/2009 FINDINGS: The lungs are well-aerated. Peribronchial thickening is noted. A calcified granuloma is noted at the left lung base. There is no evidence of focal opacification, pleural effusion or pneumothorax. The cardiomediastinal silhouette is mildly enlarged. No acute osseous abnormalities are seen. IMPRESSION: Peribronchial thickening noted. Mild cardiomegaly. No displaced rib fracture seen. Electronically Signed   By: Garald Balding M.D.   On: 09/05/2016 20:20   Ct Head Wo Contrast  Result Date: 09/05/2016 CLINICAL DATA:  Patient fell on the way of the bathroom. EXAM: CT HEAD WITHOUT CONTRAST TECHNIQUE: Contiguous axial images were obtained from the base of the skull through the vertex without intravenous contrast. COMPARISON:  12/19/2009 FINDINGS: Brain: There is no evidence for acute hemorrhage, hydrocephalus, mass lesion, or abnormal extra-axial fluid collection. No definite CT evidence for acute infarction. Diffuse loss of parenchymal volume is consistent with atrophy. Patchy low  attenuation in the deep hemispheric and periventricular white matter is nonspecific, but likely reflects chronic microvascular ischemic demyelination. Vascular: Atherosclerotic calcification is visualized in the carotid arteries. No dense MCA sign. Major dural sinuses are unremarkable. Skull: No evidence for fracture. No worrisome lytic or sclerotic lesion. Sinuses/Orbits: The visualized paranasal sinuses and mastoid air cells are clear. Visualized portions of the globes and intraorbital fat are unremarkable. Other: None. IMPRESSION: 1. Stable exam.  No acute intracranial abnormality. 2. Atrophy with chronic small vessel white matter ischemic disease. Electronically Signed   By: Misty Stanley M.D.   On: 09/05/2016 20:32   Dg Hip Unilat With Pelvis 2-3 Views Left  Result Date: 09/05/2016 CLINICAL DATA:  Left hip deformity after fall. EXAM: DG HIP (WITH OR WITHOUT PELVIS) 2-3V LEFT; LEFT FEMUR 2 VIEWS COMPARISON:  None. FINDINGS: Left hip: Intertrochanteric left proximal femur fracture with moderate coxa vara deformity. No left hip dislocation. Lucency in the lateral aspect of the left symphysis pubis may represent a nondisplaced fracture. Advanced degenerative  changes of the lower lumbar spine. Left femur: Intertrochanteric left proximal femur fracture with moderate coxa vara deformity. The left knee joint is maintained. No additional femoral fracture. IMPRESSION: Intertrochanteric left proximal femur fracture with moderate coxa vara deformity. No left hip dislocation. Probable nondisplaced fracture of left pubis symphysis. Electronically Signed   By: Kristine Garbe M.D.   On: 09/05/2016 20:23   Dg Femur Min 2 Views Left  Result Date: 09/05/2016 CLINICAL DATA:  Left hip deformity after fall. EXAM: DG HIP (WITH OR WITHOUT PELVIS) 2-3V LEFT; LEFT FEMUR 2 VIEWS COMPARISON:  None. FINDINGS: Left hip: Intertrochanteric left proximal femur fracture with moderate coxa vara deformity. No left hip  dislocation. Lucency in the lateral aspect of the left symphysis pubis may represent a nondisplaced fracture. Advanced degenerative changes of the lower lumbar spine. Left femur: Intertrochanteric left proximal femur fracture with moderate coxa vara deformity. The left knee joint is maintained. No additional femoral fracture. IMPRESSION: Intertrochanteric left proximal femur fracture with moderate coxa vara deformity. No left hip dislocation. Probable nondisplaced fracture of left pubis symphysis. Electronically Signed   By: Kristine Garbe M.D.   On: 09/05/2016 20:23        Scheduled Meds: . amitriptyline  10 mg Oral QHS  . bupivacaine (PF)  30 mL Infiltration Once  .  ceFAZolin (ANCEF) IV  2 g Intravenous On Call to OR  . chlorhexidine  60 mL Topical Once  . metoprolol tartrate  12.5 mg Oral BID  . povidone-iodine  2 application Topical Once  . simvastatin  40 mg Oral QHS   Continuous Infusions:   LOS: 1 day    Time spent: 43min    Domenic Polite, MD [facility] Pager [phone]  If 7PM-7AM, please contact night-coverage [email or web address] Password [facility] 09/06/2016, 10:44 AM

## 2016-09-06 NOTE — Anesthesia Procedure Notes (Signed)
Spinal  Patient location during procedure: OR Start time: 09/06/2016 2:57 PM Staffing Anesthesiologist: Josephine Igo Performed: anesthesiologist  Preanesthetic Checklist Completed: patient identified, site marked, surgical consent, pre-op evaluation, timeout performed, IV checked, risks and benefits discussed and monitors and equipment checked Spinal Block Patient position: left lateral decubitus Prep: site prepped and draped and DuraPrep Patient monitoring: heart rate, cardiac monitor, continuous pulse ox and blood pressure Approach: midline Location: L3-4 Injection technique: single-shot Needle Needle type: Pencan  Needle gauge: 24 G Needle length: 9 cm Needle insertion depth: 5.5 cm Assessment Sensory level: T6 Additional Notes Patient tolerated procedure well. Adequate sensory level.

## 2016-09-06 NOTE — Progress Notes (Signed)
Initial Nutrition Assessment  DOCUMENTATION CODES:   Not applicable  INTERVENTION:  Once diet advances, provide Glucerna Shake po BID, each supplement provides 220 kcal and 10 grams of protein.  NUTRITION DIAGNOSIS:   Increased nutrient needs related to  (post op healing) as evidenced by estimated needs.  GOAL:   Patient will meet greater than or equal to 90% of their needs  MONITOR:   Supplement acceptance, Diet advancement, Labs, Weight trends, Skin, I & O's  REASON FOR ASSESSMENT:   Consult Hip fracture protocol  ASSESSMENT:   83 y.o. female with medical history significant of HTN, DM, dementia with short term memory loss.  Patient [place].  L hip pain and LLE deformity post fall.  Xray revealed L intertroch fx of hip.  Pt is currently NPO for surgery today. Pt reports eating well PTA with usual consumption of at least 3 meals a day despite having a decreased appetite more recently. Usual body weight reported to be ~142 lbs. Weight stable. RD to order nutritional supplements to aid in post op healing. Nursing staff to provide once diet advances.   Pt with no observed significant fat or muscle mass loss.   Labs and medications reviewed.   Diet Order:  Diet NPO time specified Diet NPO time specified Except for: Sips with Meds  Skin:  Reviewed, no issues  Last BM:  Unknown  Height:   Ht Readings from Last 1 Encounters:  09/05/16 5\' 2"  (1.575 m)    Weight:   Wt Readings from Last 1 Encounters:  09/05/16 142 lb (64.4 kg)    Ideal Body Weight:  50 kg  BMI:  Body mass index is 25.97 kg/m.  Estimated Nutritional Needs:   Kcal:  1600-1800  Protein:  65-75 grams  Fluid:  1.6 - 1.8 L/day  EDUCATION NEEDS:   No education needs identified at this time  Corrin Parker, MS, RD, LDN Pager # [phone] After hours/ weekend pager # [phone]

## 2016-09-06 NOTE — Interval H&P Note (Signed)
History and Physical Interval Note:  09/06/2016 12:58 PM  [name]  has presented today for surgery, with the diagnosis of Left Hip Fracture  The various methods of treatment have been discussed with the patient and family. After consideration of risks, benefits and other options for treatment, the patient has consented to  Procedure(s): LEFT HIP INTRAMEDULLARY (IM) NAIL (Left) as a surgical intervention .  The patient's history has been reviewed, patient examined, no change in status, stable for surgery.  I have reviewed the patient's chart and labs.  Questions were answered to the patient's satisfaction.     Othelia Riederer D

## 2016-09-06 NOTE — H&P (View-Only) (Signed)
ORTHOPAEDIC CONSULTATION  REQUESTING PHYSICIAN: Etta Quill, DO  Chief Complaint: Left Hip pain  Assessment / Plan: Principal Problem:   Closed intertrochanteric fracture of left hip, initial encounter [facility]) Active Problems:   Essential hypertension   Dementia   Plan for Operative fixation this afternoon. -NPO  -Medicine team to admit and perform pre-op clearance -PT/OT post op -will ammend WB status postop, bedrest for now -Foley for comfort if needed to be removed POD 1/2 -VTE prophylaxis: SQ Heparin.  Lovenox likely post op.  Dispo: Currently resides at [facility]. She will likely need 24-hour observation/care for a brief time after surgery. Previously ambulatory without assistive devices.  Details, risks, benefits of the procedure discussed with the patient. She verbalizes understanding and agrees with plan. However, Her son Dionne Bucy [phone] reportedly has by POA.  And would like to discuss case with Dr. Percell Miller before proceeding with plan for IM nail left hip this afternoon.  HPI: [name] is a 83 y.o. female with a history of HTN, dementia with short term memory loss who complains of left hip pain after fall onto her left side in a bathroom at a park with her son. Immediate pain and deformity with inability to bear weight. Presented to the ED where XR shows Intertrochanteric left proximal femur fracture with moderate coxa vara deformity. No left hip dislocation. Probable nondisplaced fracture of left pubis symphysis..  Orthopedics was consulted for evaluation.  Currently denies pain. She slept comfortably. No numbness in the leg. No shortness of breath. She denies history of MI, CVA, or DVT, but admits to being forgetful and having some "memory problems ".   Past Medical History:  Diagnosis Date  . Atrial fibrillation ([place])   . Dementia   . Diabetes mellitus   . Diverticulitis 8/09, 3/10   abscess in 2009(medical tx)  .  HTN (hypertension)   . Hypercholesteremia   . Hyperthyroidism    s/p radiation, surgery  . IDA (iron deficiency anemia)   . Osteoporosis   . Thalassemia   . Tubular adenoma of colon 12/15/08   Colonosocpy Dr Tonita Cong removed cecum   Past Surgical History:  Procedure Laterality Date  . CESAREAN SECTION     x2  . CHOLECYSTECTOMY    . FOOT SURGERY     bilat  . THYROIDECTOMY, PARTIAL     Social History   Social History  . Marital status: Widowed    Spouse name: N/A  . Number of children: 4  . Years of education: 10th grade   Occupational History  . 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 Smoking status: Never Smoker  . Smokeless tobacco: None  . Alcohol use Yes     Comment: occasionally   . Drug use: No  . Sexual activity: Not Asked   Other Topics Concern  . None   Social History Narrative   Lost 1 son-mental illness, htn age 36   Family History  Problem Relation Age of Onset  . Pneumonia Father   . Coronary artery disease Mother   . Stroke Mother   . Colon cancer Mother 81  . Thalassemia Sister    Allergies  Allergen Reactions  . Procaine Hcl Other (See Comments)    Numbness/jerkiness  . Sulfonamide Derivatives Other (See Comments)    hypertension  . Penicillins Rash    REACTION: unknown reaction Has patient had a PCN reaction causing immediate rash, facial/tongue/throat swelling, SOB or  lightheadedness with hypotension:YES Has patient had a PCN reaction causing severe rash involving mucus membranes or skin necrosis:NO Has patient had a PCN reaction that required hospitalizationNO Has patient had a PCN reaction occurring within the last 10 years: NO If all of the above answers are "NO", then may proceed with Cephalosporin use.   Prior to Admission medications   Medication Sig Start Date End Date Taking? Authorizing Provider  alendronate (FOSAMAX) 70 MG tablet Take 70 mg by mouth once a week. Take with a full glass of water on an empty  stomach. Take on Wednesdays   Yes Historical Provider, MD  Calcium Carbonate-Vitamin D (CALCIUM + D PO) Take by mouth 2 (two) times daily.     Yes Historical Provider, MD  Cranberry 425 MG CAPS Take 1 capsule by mouth 2 (two) times daily.   Yes Historical Provider, MD  Dextromethorphan-Guaifenesin (ROBITUSSIN DM) 10-100 MG/5ML liquid Take 10 mLs by mouth every 12 (twelve) hours.   Yes Historical Provider, MD  docusate sodium (COLACE) 100 MG capsule Take 100 mg by mouth 2 (two) times daily.   Yes Historical Provider, MD  donepezil (ARICEPT) 5 MG tablet Take 5 mg by mouth at bedtime.   Yes Historical Provider, MD  estradiol (VAGIFEM) 25 MCG vaginal tablet Place 25 mcg vaginally 2 (two) times a week.     Yes Historical Provider, MD  iron polysaccharides (NIFEREX) 150 MG capsule Take 150 mg by mouth 2 (two) times daily.   Yes Historical Provider, MD  memantine (NAMENDA) 10 MG tablet Take 10 mg by mouth 2 (two) times daily.   Yes Historical Provider, MD  vitamin B-6 (PYRIDOXINE) 25 MG tablet Take 25 mg by mouth daily.   Yes Historical Provider, MD  amitriptyline (ELAVIL) 10 MG tablet Take 10 mg by mouth at bedtime.      Historical Provider, MD  BENICAR HCT 40-12.5 MG per tablet TAKE 1 TABLET BY MOUTH ONCE DAILY. Patient not taking: Reported on 09/05/2016 12/13/13   Dani Gobble Croitoru, MD  Coenzyme Q10 (CO Q-10) 100 MG CAPS Take by mouth.      Historical Provider, MD  fish oil-omega-3 fatty acids 1000 MG capsule Take 2 g by mouth daily.      Historical Provider, MD  Glucosamine-Chondroit-Vit C-Mn (GLUCOSAMINE 1500 COMPLEX PO) Take by mouth.      Historical Provider, MD  metoprolol tartrate (LOPRESSOR) 25 MG tablet Take 25 mg by mouth 2 (two) times daily. 1/2 pill  Bid      Historical Provider, MD  pravastatin (PRAVACHOL) 40 MG tablet TAKE 1 TABLET BY MOUTH AT BEDTIME. Patient not taking: Reported on 09/05/2016 10/04/13   Sanda Klein, MD  risedronate (ACTONEL) 35 MG tablet Take 35 mg by mouth every 7 (seven)  days. with water on empty stomach, nothing by mouth or lie down for next 30 minutes.     Historical Provider, MD  simvastatin (ZOCOR) 40 MG tablet Take 40 mg by mouth at bedtime.      Historical Provider, MD   Dg Chest 1 View  Result Date: 09/05/2016 CLINICAL DATA:  Preoperative chest radiograph. Left hip deformity. Initial encounter. EXAM: CHEST 1 VIEW COMPARISON:  Chest radiograph performed 12/19/2009 FINDINGS: The lungs are well-aerated. Peribronchial thickening is noted. A calcified granuloma is noted at the left lung base. There is no evidence of focal opacification, pleural effusion or pneumothorax. The cardiomediastinal silhouette is mildly enlarged. No acute osseous abnormalities are seen. IMPRESSION: Peribronchial thickening noted. Mild cardiomegaly. No displaced rib fracture seen.  Electronically Signed   By: Garald Balding M.D.   On: 09/05/2016 20:20   Ct Head Wo Contrast  Result Date: 09/05/2016 CLINICAL DATA:  Patient fell on the way of the bathroom. EXAM: CT HEAD WITHOUT CONTRAST TECHNIQUE: Contiguous axial images were obtained from the base of the skull through the vertex without intravenous contrast. COMPARISON:  12/19/2009 FINDINGS: Brain: There is no evidence for acute hemorrhage, hydrocephalus, mass lesion, or abnormal extra-axial fluid collection. No definite CT evidence for acute infarction. Diffuse loss of parenchymal volume is consistent with atrophy. Patchy low attenuation in the deep hemispheric and periventricular white matter is nonspecific, but likely reflects chronic microvascular ischemic demyelination. Vascular: Atherosclerotic calcification is visualized in the carotid arteries. No dense MCA sign. Major dural sinuses are unremarkable. Skull: No evidence for fracture. No worrisome lytic or sclerotic lesion. Sinuses/Orbits: The visualized paranasal sinuses and mastoid air cells are clear. Visualized portions of the globes and intraorbital fat are unremarkable. Other: None.  IMPRESSION: 1. Stable exam.  No acute intracranial abnormality. 2. Atrophy with chronic small vessel white matter ischemic disease. Electronically Signed   By: Misty Stanley M.D.   On: 09/05/2016 20:32   Dg Hip Unilat With Pelvis 2-3 Views Left  Result Date: 09/05/2016 CLINICAL DATA:  Left hip deformity after fall. EXAM: DG HIP (WITH OR WITHOUT PELVIS) 2-3V LEFT; LEFT FEMUR 2 VIEWS COMPARISON:  None. FINDINGS: Left hip: Intertrochanteric left proximal femur fracture with moderate coxa vara deformity. No left hip dislocation. Lucency in the lateral aspect of the left symphysis pubis may represent a nondisplaced fracture. Advanced degenerative changes of the lower lumbar spine. Left femur: Intertrochanteric left proximal femur fracture with moderate coxa vara deformity. The left knee joint is maintained. No additional femoral fracture. IMPRESSION: Intertrochanteric left proximal femur fracture with moderate coxa vara deformity. No left hip dislocation. Probable nondisplaced fracture of left pubis symphysis. Electronically Signed   By: Kristine Garbe M.D.   On: 09/05/2016 20:23   Dg Femur Min 2 Views Left  Result Date: 09/05/2016 CLINICAL DATA:  Left hip deformity after fall. EXAM: DG HIP (WITH OR WITHOUT PELVIS) 2-3V LEFT; LEFT FEMUR 2 VIEWS COMPARISON:  None. FINDINGS: Left hip: Intertrochanteric left proximal femur fracture with moderate coxa vara deformity. No left hip dislocation. Lucency in the lateral aspect of the left symphysis pubis may represent a nondisplaced fracture. Advanced degenerative changes of the lower lumbar spine. Left femur: Intertrochanteric left proximal femur fracture with moderate coxa vara deformity. The left knee joint is maintained. No additional femoral fracture. IMPRESSION: Intertrochanteric left proximal femur fracture with moderate coxa vara deformity. No left hip dislocation. Probable nondisplaced fracture of left pubis symphysis. Electronically Signed   By: Kristine Garbe M.D.   On: 09/05/2016 20:23    Positive ROS: All other systems have been reviewed and were otherwise negative with the exception of those mentioned in the HPI and as above.  Objective: Labs cbc  Recent Labs  09/05/16 2027 09/05/16 2048 09/06/16 0314  WBC 13.8*  --  13.4*  HGB 10.5* 10.5* 10.1*  HCT 30.8* 31.0* 29.3*  PLT 186  --  204    Labs coag  Recent Labs  09/05/16 2031  INR 1.06     Recent Labs  09/05/16 2048 09/06/16 0314  NA 141 139  K 3.3* 3.6  CL 105 103  CO2  --  27  GLUCOSE 150* 159*  BUN 10 11  CREATININE 0.70 0.77  CALCIUM  --  8.5*  Physical Exam: Vitals:   09/05/16 2239 09/06/16 0553  BP: (!) 146/57 (!) 145/62  Pulse: 84 68  Resp: 18 16  Temp: 99.5 F (37.5 C) 98.2 F (36.8 C)   General: Alert, no acute distress. Supine and then Mental status: Alert and Oriented x3 Neurologic: Speech Clear and organized, no gross focal findings or movement disorder appreciated. Respiratory: No cyanosis, no use of accessory musculature Cardiovascular: No pedal edema GI: Abdomen is soft and non-tender, non-distended. Skin: Warm and dry.  No lesions in the area of chief complaint Extremities: Warm and dry Psychiatric: Patient has dementia and short-term memory loss.  MUSCULOSKELETAL:  Left leg short and externally rotated. Pain with range of motion. Neurovascularly intact distally. Sensation intact distally. EHL, FHL, dorsiflexion, plantar flexion intact. Other extremities are atraumatic with painless ROM and NVI.   Prudencio Burly III PA-C 09/06/2016 7:24 AM

## 2016-09-06 NOTE — Consult Note (Signed)
ORTHOPAEDIC CONSULTATION  REQUESTING PHYSICIAN: Etta Quill, DO  Chief Complaint: Left Hip pain  Assessment / Plan: Principal Problem:   Closed intertrochanteric fracture of left hip, initial encounter [facility]) Active Problems:   Essential hypertension   Dementia   Plan for Operative fixation this afternoon. -NPO  -Medicine team to admit and perform pre-op clearance -PT/OT post op -will ammend WB status postop, bedrest for now -Foley for comfort if needed to be removed POD 1/2 -VTE prophylaxis: SQ Heparin.  Lovenox likely post op.  Dispo: Currently resides at [facility]. She will likely need 24-hour observation/care for a brief time after surgery. Previously ambulatory without assistive devices.  Details, risks, benefits of the procedure discussed with the patient. She verbalizes understanding and agrees with plan. However, Her son Dionne Bucy [phone] reportedly has by POA.  And would like to discuss case with Dr. Percell Miller before proceeding with plan for IM nail left hip this afternoon.  HPI: [name] is a 83 y.o. female with a history of HTN, dementia with short term memory loss who complains of left hip pain after fall onto her left side in a bathroom at a park with her son. Immediate pain and deformity with inability to bear weight. Presented to the ED where XR shows Intertrochanteric left proximal femur fracture with moderate coxa vara deformity. No left hip dislocation. Probable nondisplaced fracture of left pubis symphysis..  Orthopedics was consulted for evaluation.  Currently denies pain. She slept comfortably. No numbness in the leg. No shortness of breath. She denies history of MI, CVA, or DVT, but admits to being forgetful and having some "memory problems ".   Past Medical History:  Diagnosis Date  . Atrial fibrillation ([place])   . Dementia   . Diabetes mellitus   . Diverticulitis 8/09, 3/10   abscess in 2009(medical tx)  .  HTN (hypertension)   . Hypercholesteremia   . Hyperthyroidism    s/p radiation, surgery  . IDA (iron deficiency anemia)   . Osteoporosis   . Thalassemia   . Tubular adenoma of colon 12/15/08   Colonosocpy Dr Tonita Cong removed cecum   Past Surgical History:  Procedure Laterality Date  . CESAREAN SECTION     x2  . CHOLECYSTECTOMY    . FOOT SURGERY     bilat  . THYROIDECTOMY, PARTIAL     Social History   Social History  . Marital status: Widowed    Spouse name: N/A  . Number of children: 4  . Years of education: 10th grade   Occupational History  . 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 Smoking status: Never Smoker  . Smokeless tobacco: None  . Alcohol use Yes     Comment: occasionally   . Drug use: No  . Sexual activity: Not Asked   Other Topics Concern  . None   Social History Narrative   Lost 1 son-mental illness, htn age 45   Family History  Problem Relation Age of Onset  . Pneumonia Father   . Coronary artery disease Mother   . Stroke Mother   . Colon cancer Mother 40  . Thalassemia Sister    Allergies  Allergen Reactions  . Procaine Hcl Other (See Comments)    Numbness/jerkiness  . Sulfonamide Derivatives Other (See Comments)    hypertension  . Penicillins Rash    REACTION: unknown reaction Has patient had a PCN reaction causing immediate rash, facial/tongue/throat swelling, SOB or  lightheadedness with hypotension:YES Has patient had a PCN reaction causing severe rash involving mucus membranes or skin necrosis:NO Has patient had a PCN reaction that required hospitalizationNO Has patient had a PCN reaction occurring within the last 10 years: NO If all of the above answers are "NO", then may proceed with Cephalosporin use.   Prior to Admission medications   Medication Sig Start Date End Date Taking? Authorizing Provider  alendronate (FOSAMAX) 70 MG tablet Take 70 mg by mouth once a week. Take with a full glass of water on an empty  stomach. Take on Wednesdays   Yes Historical Provider, MD  Calcium Carbonate-Vitamin D (CALCIUM + D PO) Take by mouth 2 (two) times daily.     Yes Historical Provider, MD  Cranberry 425 MG CAPS Take 1 capsule by mouth 2 (two) times daily.   Yes Historical Provider, MD  Dextromethorphan-Guaifenesin (ROBITUSSIN DM) 10-100 MG/5ML liquid Take 10 mLs by mouth every 12 (twelve) hours.   Yes Historical Provider, MD  docusate sodium (COLACE) 100 MG capsule Take 100 mg by mouth 2 (two) times daily.   Yes Historical Provider, MD  donepezil (ARICEPT) 5 MG tablet Take 5 mg by mouth at bedtime.   Yes Historical Provider, MD  estradiol (VAGIFEM) 25 MCG vaginal tablet Place 25 mcg vaginally 2 (two) times a week.     Yes Historical Provider, MD  iron polysaccharides (NIFEREX) 150 MG capsule Take 150 mg by mouth 2 (two) times daily.   Yes Historical Provider, MD  memantine (NAMENDA) 10 MG tablet Take 10 mg by mouth 2 (two) times daily.   Yes Historical Provider, MD  vitamin B-6 (PYRIDOXINE) 25 MG tablet Take 25 mg by mouth daily.   Yes Historical Provider, MD  amitriptyline (ELAVIL) 10 MG tablet Take 10 mg by mouth at bedtime.      Historical Provider, MD  BENICAR HCT 40-12.5 MG per tablet TAKE 1 TABLET BY MOUTH ONCE DAILY. Patient not taking: Reported on 09/05/2016 12/13/13   Dani Gobble Croitoru, MD  Coenzyme Q10 (CO Q-10) 100 MG CAPS Take by mouth.      Historical Provider, MD  fish oil-omega-3 fatty acids 1000 MG capsule Take 2 g by mouth daily.      Historical Provider, MD  Glucosamine-Chondroit-Vit C-Mn (GLUCOSAMINE 1500 COMPLEX PO) Take by mouth.      Historical Provider, MD  metoprolol tartrate (LOPRESSOR) 25 MG tablet Take 25 mg by mouth 2 (two) times daily. 1/2 pill  Bid      Historical Provider, MD  pravastatin (PRAVACHOL) 40 MG tablet TAKE 1 TABLET BY MOUTH AT BEDTIME. Patient not taking: Reported on 09/05/2016 10/04/13   Sanda Klein, MD  risedronate (ACTONEL) 35 MG tablet Take 35 mg by mouth every 7 (seven)  days. with water on empty stomach, nothing by mouth or lie down for next 30 minutes.     Historical Provider, MD  simvastatin (ZOCOR) 40 MG tablet Take 40 mg by mouth at bedtime.      Historical Provider, MD   Dg Chest 1 View  Result Date: 09/05/2016 CLINICAL DATA:  Preoperative chest radiograph. Left hip deformity. Initial encounter. EXAM: CHEST 1 VIEW COMPARISON:  Chest radiograph performed 12/19/2009 FINDINGS: The lungs are well-aerated. Peribronchial thickening is noted. A calcified granuloma is noted at the left lung base. There is no evidence of focal opacification, pleural effusion or pneumothorax. The cardiomediastinal silhouette is mildly enlarged. No acute osseous abnormalities are seen. IMPRESSION: Peribronchial thickening noted. Mild cardiomegaly. No displaced rib fracture seen.  Electronically Signed   By: Garald Balding M.D.   On: 09/05/2016 20:20   Ct Head Wo Contrast  Result Date: 09/05/2016 CLINICAL DATA:  Patient fell on the way of the bathroom. EXAM: CT HEAD WITHOUT CONTRAST TECHNIQUE: Contiguous axial images were obtained from the base of the skull through the vertex without intravenous contrast. COMPARISON:  12/19/2009 FINDINGS: Brain: There is no evidence for acute hemorrhage, hydrocephalus, mass lesion, or abnormal extra-axial fluid collection. No definite CT evidence for acute infarction. Diffuse loss of parenchymal volume is consistent with atrophy. Patchy low attenuation in the deep hemispheric and periventricular white matter is nonspecific, but likely reflects chronic microvascular ischemic demyelination. Vascular: Atherosclerotic calcification is visualized in the carotid arteries. No dense MCA sign. Major dural sinuses are unremarkable. Skull: No evidence for fracture. No worrisome lytic or sclerotic lesion. Sinuses/Orbits: The visualized paranasal sinuses and mastoid air cells are clear. Visualized portions of the globes and intraorbital fat are unremarkable. Other: None.  IMPRESSION: 1. Stable exam.  No acute intracranial abnormality. 2. Atrophy with chronic small vessel white matter ischemic disease. Electronically Signed   By: Misty Stanley M.D.   On: 09/05/2016 20:32   Dg Hip Unilat With Pelvis 2-3 Views Left  Result Date: 09/05/2016 CLINICAL DATA:  Left hip deformity after fall. EXAM: DG HIP (WITH OR WITHOUT PELVIS) 2-3V LEFT; LEFT FEMUR 2 VIEWS COMPARISON:  None. FINDINGS: Left hip: Intertrochanteric left proximal femur fracture with moderate coxa vara deformity. No left hip dislocation. Lucency in the lateral aspect of the left symphysis pubis may represent a nondisplaced fracture. Advanced degenerative changes of the lower lumbar spine. Left femur: Intertrochanteric left proximal femur fracture with moderate coxa vara deformity. The left knee joint is maintained. No additional femoral fracture. IMPRESSION: Intertrochanteric left proximal femur fracture with moderate coxa vara deformity. No left hip dislocation. Probable nondisplaced fracture of left pubis symphysis. Electronically Signed   By: Kristine Garbe M.D.   On: 09/05/2016 20:23   Dg Femur Min 2 Views Left  Result Date: 09/05/2016 CLINICAL DATA:  Left hip deformity after fall. EXAM: DG HIP (WITH OR WITHOUT PELVIS) 2-3V LEFT; LEFT FEMUR 2 VIEWS COMPARISON:  None. FINDINGS: Left hip: Intertrochanteric left proximal femur fracture with moderate coxa vara deformity. No left hip dislocation. Lucency in the lateral aspect of the left symphysis pubis may represent a nondisplaced fracture. Advanced degenerative changes of the lower lumbar spine. Left femur: Intertrochanteric left proximal femur fracture with moderate coxa vara deformity. The left knee joint is maintained. No additional femoral fracture. IMPRESSION: Intertrochanteric left proximal femur fracture with moderate coxa vara deformity. No left hip dislocation. Probable nondisplaced fracture of left pubis symphysis. Electronically Signed   By: Kristine Garbe M.D.   On: 09/05/2016 20:23    Positive ROS: All other systems have been reviewed and were otherwise negative with the exception of those mentioned in the HPI and as above.  Objective: Labs cbc  Recent Labs  09/05/16 2027 09/05/16 2048 09/06/16 0314  WBC 13.8*  --  13.4*  HGB 10.5* 10.5* 10.1*  HCT 30.8* 31.0* 29.3*  PLT 186  --  204    Labs coag  Recent Labs  09/05/16 2031  INR 1.06     Recent Labs  09/05/16 2048 09/06/16 0314  NA 141 139  K 3.3* 3.6  CL 105 103  CO2  --  27  GLUCOSE 150* 159*  BUN 10 11  CREATININE 0.70 0.77  CALCIUM  --  8.5*  Physical Exam: Vitals:   09/05/16 2239 09/06/16 0553  BP: (!) 146/57 (!) 145/62  Pulse: 84 68  Resp: 18 16  Temp: 99.5 F (37.5 C) 98.2 F (36.8 C)   General: Alert, no acute distress. Supine and then Mental status: Alert and Oriented x3 Neurologic: Speech Clear and organized, no gross focal findings or movement disorder appreciated. Respiratory: No cyanosis, no use of accessory musculature Cardiovascular: No pedal edema GI: Abdomen is soft and non-tender, non-distended. Skin: Warm and dry.  No lesions in the area of chief complaint Extremities: Warm and dry Psychiatric: Patient has dementia and short-term memory loss.  MUSCULOSKELETAL:  Left leg short and externally rotated. Pain with range of motion. Neurovascularly intact distally. Sensation intact distally. EHL, FHL, dorsiflexion, plantar flexion intact. Other extremities are atraumatic with painless ROM and NVI.   Prudencio Burly III PA-C 09/06/2016 7:24 AM

## 2016-09-07 LAB — CBC
HCT: 25.9 % — ABNORMAL LOW (ref 36.0–46.0)
HEMOGLOBIN: 8.5 g/dL — AB (ref 12.0–15.0)
MCH: 24.5 pg — ABNORMAL LOW (ref 26.0–34.0)
MCHC: 32.8 g/dL (ref 30.0–36.0)
MCV: 74.6 fL — AB (ref 78.0–100.0)
Platelets: 135 10*3/uL — ABNORMAL LOW (ref 150–400)
RBC: 3.47 MIL/uL — AB (ref 3.87–5.11)
RDW: 19.1 % — AB (ref 11.5–15.5)
WBC: 9.4 10*3/uL (ref 4.0–10.5)

## 2016-09-07 LAB — BASIC METABOLIC PANEL
ANION GAP: 9 (ref 5–15)
BUN: 10 mg/dL (ref 6–20)
CALCIUM: 7.9 mg/dL — AB (ref 8.9–10.3)
CO2: 25 mmol/L (ref 22–32)
Chloride: 104 mmol/L (ref 101–111)
Creatinine, Ser: 0.61 mg/dL (ref 0.44–1.00)
GFR calc Af Amer: >60 mL/min (ref >60)
GFR calc non Af Amer: >60 mL/min (ref >60)
GLUCOSE: 142 mg/dL — AB (ref 65–99)
POTASSIUM: 3.8 mmol/L (ref 3.5–5.1)
Sodium: 138 mmol/L (ref 135–145)

## 2016-09-07 LAB — GLUCOSE, CAPILLARY
GLUCOSE-CAPILLARY: 136 mg/dL — AB (ref 65–99)
Glucose-Capillary: 148 mg/dL — ABNORMAL HIGH (ref 65–99)
Glucose-Capillary: 182 mg/dL — ABNORMAL HIGH (ref 65–99)

## 2016-09-07 MED ORDER — BACLOFEN 10 MG PO TABS: 5.0000 mg | ORAL_TABLET | Freq: Three times a day (TID) | ORAL | Status: DC | PRN

## 2016-09-07 NOTE — Evaluation (Addendum)
Occupational Therapy Evaluation Patient Details Name: TRAMANH STJAMES MRN: [id number] DOB: [birth year] Today's Date: 09/07/2016    History of Present Illness Pt is an 83 y.o. female who presented to the ED after a fall on her L side and now s/p L hip intramedullary nail. She has a PMH significant for HTN and dementia.    Clinical Impression   PTA, pt was living in an ALF but was able to complete dressing and bathing independently. She has a history of dementia with short-term memory loss at baseline but per family is highly independent in her daily routine. She currently requires total assist for LB dressing and mod assist +2 for toilet transfers. Pt would benefit from continued OT services while admitted to improve independence with ADL and functional mobility. Recommend short-term SNF placement for continued rehabilitation in order to maximize return to PLOF. Will continue to follow acutely with a focus on LB ADL and ADL transfers.  Of note, pt with stable vitals on arrival on 2LO2. Attempted simulated toilet transfer on RA but pt with SpO2 desaturation to 81% on RA. Returned 2LO2 and pt with O2 saturation 95% within 30 seconds.    Follow Up Recommendations  SNF;Supervision/Assistance - 24 hour    Equipment Recommendations  Other (comment) (TBD)    Recommendations for Other Services       Precautions / Restrictions Precautions Precautions: Fall Restrictions Weight Bearing Restrictions: Yes LLE Weight Bearing: Weight bearing as tolerated      Mobility Bed Mobility Overal bed mobility: Needs Assistance Bed Mobility: Supine to Sit     Supine to sit: Max assist;+2 for physical assistance     General bed mobility comments: Max assist for B LE's and trunk.  Transfers Overall transfer level: Needs assistance Equipment used: Rolling walker (2 wheeled) Transfers: Sit to/from [facility] Sit to Stand: +2 physical assistance;Mod assist;Max assist Stand pivot  transfers: +2 physical assistance;Mod assist       General transfer comment: Max lifting assist initially for sit<>stand. Mod +2 assist for stand-pivot. L knee buckles.    Balance Overall balance assessment: Needs assistance Sitting-balance support: Bilateral upper extremity supported;Feet supported;No upper extremity supported Sitting balance-Leahy Scale: Fair Sitting balance - Comments: Able to statically sit without UE support.   Standing balance support: Bilateral upper extremity supported;During functional activity Standing balance-Leahy Scale: Poor Standing balance comment: Reliant on B UE support and external assistance.                            ADL Overall ADL's : Needs assistance/impaired Eating/Feeding: Set up;Sitting   Grooming: Set up;Sitting   Upper Body Bathing: Set up;Sitting   Lower Body Bathing: Maximal assistance;Sit to/from stand;+2 for physical assistance   Upper Body Dressing : Set up;Sitting   Lower Body Dressing: Total assistance   Toilet Transfer: Moderate assistance;+2 for physical assistance;Stand-pivot;BSC   Toileting- Clothing Manipulation and Hygiene: Sit to/from stand;Maximal assistance;+2 for physical assistance Toileting - Clothing Manipulation Details (indicate cue type and reason): Max assist as pt is reliant on  B UE support for standing.     Functional mobility during ADLs: Moderate assistance;+2 for physical assistance General ADL Comments: Pt able to take a few steps for simulated toilet transfer. Pt with history of dementia. Pleasant and participates well especially with verbal encouragement.     Vision Patient Visual Report: No change from baseline Vision Assessment?: No apparent visual deficits     Perception  Praxis      Pertinent Vitals/Pain Pain Assessment: 0-10 Pain Score: 7  Pain Location: R hip Pain Descriptors / Indicators: Aching;Sore;Operative site guarding Pain Intervention(s): Limited activity  within patient's tolerance;Monitored during session;Repositioned     Hand Dominance Right   Extremity/Trunk Assessment Upper Extremity Assessment Upper Extremity Assessment: Overall WFL for tasks assessed   Lower Extremity Assessment Lower Extremity Assessment: RLE deficits/detail;Generalized weakness RLE Deficits / Details: Decreased strength and ROM as expected post-operatively.  RLE: Unable to fully assess due to pain       Communication Communication Communication: No difficulties   Cognition Arousal/Alertness: Awake/alert Behavior During Therapy: WFL for tasks assessed/performed Overall Cognitive Status: History of cognitive impairments - at baseline                 General Comments: Dementia with short-term memory loss.   General Comments       Exercises       Shoulder Instructions      Home Living Family/patient expects to be discharged to:: Skilled nursing facility                                 Additional Comments: Pt is a resident of an ALF but was independent in dressing/bathing and ambulation.      Prior Functioning/Environment Level of Independence: Independent        Comments: Resides at ALF where she receives assistance with cooking/cleaning and IADL due to memory problems. Was ambulating and completing basic ADL independently.        OT Problem List: Decreased strength;Decreased activity tolerance;Decreased range of motion;Impaired balance (sitting and/or standing);Decreased safety awareness;Decreased knowledge of use of DME or AE;Decreased knowledge of precautions;Pain      OT Treatment/Interventions: Self-care/ADL training;Therapeutic exercise;Energy conservation;DME and/or AE instruction;Therapeutic activities;Patient/family education;Balance training    OT Goals(Current goals can be found in the care plan section) Acute Rehab OT Goals Patient Stated Goal: to have less pain OT Goal Formulation: With patient/family Time  For Goal Achievement: 09/14/16 Potential to Achieve Goals: Good ADL Goals Pt Will Perform Lower Body Bathing: with min assist;sit to/from stand Pt Will Perform Lower Body Dressing: with min assist;sit to/from stand Pt Will Transfer to Toilet: ambulating;with min guard assist;bedside commode (BSC over toilet) Pt Will Perform Toileting - Clothing Manipulation and hygiene: with min guard assist;sit to/from stand  OT Frequency: Min 2X/week   Barriers to D/C:            Co-evaluation PT/OT/SLP Co-Evaluation/Treatment: Yes Reason for Co-Treatment: Complexity of the patient's impairments (multi-system involvement);Necessary to address cognition/behavior during functional activity;For patient/therapist safety;To address functional/ADL transfers   OT goals addressed during session: ADL's and self-care      End of Session Equipment Utilized During Treatment: Gait belt;Rolling walker;Oxygen (2L O2) Nurse Communication: Mobility status  Activity Tolerance: Patient tolerated treatment well Patient left: in chair;with call bell/phone within reach;with family/visitor present  OT Visit Diagnosis: History of falling (Z91.81);Muscle weakness (generalized) (M62.81)                ADL either performed or assessed with clinical judgement  Time: [id number] OT Time Calculation (min): 32 min Charges:  OT General Charges $OT Visit: 1 Procedure OT Evaluation $OT Eval Moderate Complexity: 1 Procedure G-Codes:     Norman Herrlich, MS OTR/L  Pager: [place] A Verner Mccrone 09/07/2016, 11:36 AM

## 2016-09-07 NOTE — Plan of Care (Signed)
Problem: Pain Managment: Goal: General experience of comfort will improve Outcome: Progressing Denies pain  Problem: Tissue Perfusion: Goal: Risk factors for ineffective tissue perfusion will decrease Outcome: Progressing SCDs are on, no s/s of dvt noted  Problem: Bowel/Gastric: Goal: Will not experience complications related to bowel motility Outcome: Progressing Denies bowel issues

## 2016-09-07 NOTE — Clinical Social Work Note (Signed)
Clinical Social Work Assessment  Patient Details  Name: [name] MRN: [id number] Date of Birth: [birth year]  Date of referral:  09/07/16               Reason for consult:  Facility Placement                Permission sought to share information with:  Facility Sport and exercise psychologist, Family Supports Permission granted to share information::  Yes, Verbal Permission Granted  Name::     [name]::  SNFs  Relationship::  Son  Contact Information:  [phone]  Housing/Transportation Living arrangements for the past 2 months:  [place] of Information:  Adult Children Patient Interpreter Needed:  None Criminal Activity/Legal Involvement Pertinent to Current Situation/Hospitalization:  No - Comment as needed Significant Relationships:  Adult Children Lives with:  Facility Resident Do you feel safe going back to the place where you live?  No Need for family participation in patient care:  Yes (Comment)  Care giving concerns:  CSW received consult for possible SNF placement at time of discharge. CSW spoke with patient and patient's son regarding PT recommendation of SNF placement at time of discharge. Patient's son reported that patient lives at an ALF and given patient's current physical needs and fall risk, she will need snf. Patient expressed understanding of PT recommendation and is agreeable to SNF placement at time of discharge. CSW to continue to follow and assist with discharge planning needs.   Social Worker assessment / plan:  CSW spoke with patient and patient's son concerning possibility of rehab at [facility] before returning home.  Employment status:  Retired Nurse, adult PT Recommendations:  [place] / Referral to community resources:  [employer]  Patient/Family's Response to care:  Patient recognizes need for rehab before returning home and is agreeable to a SNF in [place]. Patient's son reported preference for [facility].  Patient/Family's Understanding of and Emotional Response to Diagnosis, Current Treatment, and Prognosis:  Patient/family is realistic regarding therapy needs and expressed being hopeful for SNF placement. Patient expressed understanding of CSW role and discharge process. No questions/concerns about plan or treatment.    Emotional Assessment Appearance:  Appears stated age Attitude/Demeanor/Rapport:  Other (Appropriate) Affect (typically observed):  Accepting, Appropriate Orientation:  Oriented to Self, Oriented to Place, Oriented to Situation Alcohol / Substance use:  Not Applicable Psych involvement (Current and /or in the community):  No (Comment)  Discharge Needs  Concerns to be addressed:  Care Coordination Readmission within the last 30 days:  No Current discharge risk:  None Barriers to Discharge:  Continued Medical Work up   [employer], [place] 09/07/2016, 2:02 PM

## 2016-09-07 NOTE — Progress Notes (Signed)
Subjective: 1 Day Post-Op Procedure(s) (LRB): LEFT HIP INTRAMEDULLARY (IM) NAIL (Left) Patient reports pain as 5 on 0-10 scale.    Objective: Vital signs in last 24 hours: Temp:  [97.5 F (36.4 C)-99.9 F (37.7 C)] 98.9 F (37.2 C) (02/24 0447) Pulse Rate:  [77-90] 84 (02/24 0447) Resp:  [12-18] 16 (02/24 0447) BP: (141-167)/(57-88) 165/61 (02/24 0447) SpO2:  [93 %-98 %] 93 % (02/24 0447)  Intake/Output from previous day: 02/23 0701 - 02/24 0700 In: 1270 [P.O.:170; I.V.:1100] Out: 1300 [Urine:1250; Blood:50] Intake/Output this shift: No intake/output data recorded.   Recent Labs  09/05/16 2027 09/05/16 2048 09/06/16 0314  HGB 10.5* 10.5* 10.1*    Recent Labs  09/05/16 2027 09/05/16 2048 09/06/16 0314  WBC 13.8*  --  13.4*  RBC 4.23  --  3.95  HCT 30.8* 31.0* 29.3*  PLT 186  --  204    Recent Labs  09/05/16 2048 09/06/16 0314  NA 141 139  K 3.3* 3.6  CL 105 103  CO2  --  27  BUN 10 11  CREATININE 0.70 0.77  GLUCOSE 150* 159*  CALCIUM  --  8.5*    Recent Labs  09/05/16 2031  INR 1.06    Neurologically intact ABD soft Incision: dressing C/D/I and moderate amount of eccymosis  Assessment/Plan: 1 Day Post-Op Procedure(s) (LRB): LEFT HIP INTRAMEDULLARY (IM) NAIL (Left)  Principal Problem:   Closed intertrochanteric fracture of left hip, initial encounter [facility]) Active Problems:   Essential hypertension   Dementia  Advance diet Up with therapy Discharge to SNF on Monday or Tuesday depending on insurance and bed availability  Saleha Kalp J 09/07/2016, 8:57 AM

## 2016-09-07 NOTE — NC FL2 (Signed)
[place] LEVEL OF CARE SCREENING TOOL     IDENTIFICATION  Patient Name: [name] Birthdate: [birth year] Sex: female Admission Date (Current Location): 09/05/2016  [facility] and [place] Number:  Herbalist and Address:  [facility], [place] [address] [place], [place] [address]      Provider Number: [id number]  Attending Physician Name and Address:  Domenic Polite, MD  Relative Name and Phone Number:  Dionne Bucy, son, Chauncey [place], [phone]    Current Level of Care: Hospital Recommended Level of Care: [place] Prior Approval Number:    Date Approved/Denied:   PASRR Number: [id number]  Discharge Plan: SNF    Current Diagnoses: Patient Active Problem List   Diagnosis Date Noted  . Closed intertrochanteric fracture of left hip, initial encounter ([place]) 09/05/2016  . Dementia 09/05/2016  . Abdominal pain 01/10/2011  . DERANGEMENT OF POSTERIOR HORN OF MEDIAL MENISCUS 09/25/2010  . CLOSED DISLOCATION OF DISTAL RADIOULNAR 03/28/2010  . COLONIC POLYPS, ADENOMATOUS, HX OF 07/31/2009  . PAROXYSMAL ATRIAL FIBRILLATION 10/26/2008  . ANEMIA, CHRONIC 10/25/2008  . OTHER THALASSEMIA 10/25/2008  . DIVERTICULITIS, COLON 10/25/2008  . DM 10/24/2008  . HYPERCHOLESTEROLEMIA 10/24/2008  . ANEMIA, IRON DEFICIENCY 10/24/2008  . Essential hypertension 10/24/2008  . OSTEOPOROSIS 10/24/2008  . HYPERTHYROIDISM, HX OF 10/24/2008    Orientation RESPIRATION BLADDER Height & Weight     Self, Place, Situation  O2 (Nasal cannula 2L) Continent Weight: 64.4 kg (142 lb) Height:  5\' 2"  (157.5 cm)  BEHAVIORAL SYMPTOMS/MOOD NEUROLOGICAL BOWEL NUTRITION STATUS      Continent Diet (Please See DC Summary)  AMBULATORY STATUS COMMUNICATION OF NEEDS Skin   Extensive Assist Verbally Surgical wounds (Closed incision on knee, leg, and hip)                       Personal Care Assistance Level of Assistance  Bathing, Feeding, Dressing  Bathing Assistance: Maximum assistance Feeding assistance: Independent Dressing Assistance: Limited assistance     Functional Limitations Info             SPECIAL CARE FACTORS FREQUENCY  PT (By licensed PT), OT (By licensed OT)     PT Frequency: 5x/week OT Frequency: 3x/week            Contractures      Additional Factors Info  Code Status, Allergies Code Status Info: Full Allergies Info: Procaine Hcl, Sulfonamide Derivatives, Penicillins           Current Medications (09/07/2016):  This is the current hospital active medication list Current Facility-Administered Medications  Medication Dose Route Frequency Provider Last Rate Last Dose  . acetaminophen (TYLENOL) tablet 650 mg  650 mg Oral Q6H PRN Charna Elizabeth Martensen III, PA-C       Or  . acetaminophen (TYLENOL) suppository 650 mg  650 mg Rectal Q6H PRN Charna Elizabeth Martensen III, PA-C      . amitriptyline (ELAVIL) tablet 10 mg  10 mg Oral QHS Etta Quill, DO   10 mg at 09/06/16 2207  . baclofen (LIORESAL) tablet 5 mg  5 mg Oral TID PRN Domenic Polite, MD      . bupivacaine (PF) (MARCAINE) 0.25 % injection 30 mL  30 mL Infiltration Once Etta Quill, DO      . docusate sodium (COLACE) capsule 100 mg  100 mg Oral BID Charna Elizabeth Martensen III, PA-C   100 mg at 09/07/16 0959  . donepezil (ARICEPT) tablet 5 mg  5 mg Oral QHS Domenic Polite, MD   5 mg at 09/06/16 2207  . enoxaparin (LOVENOX) injection 40 mg  40 mg Subcutaneous Q24H Charna Elizabeth Martensen III, PA-C      . feeding supplement (GLUCERNA SHAKE) (GLUCERNA SHAKE) liquid 237 mL  237 mL Oral BID BM Domenic Polite, MD   237 mL at 09/07/16 0958  . HYDROcodone-acetaminophen (NORCO/VICODIN) 5-325 MG per tablet 1-2 tablet  1-2 tablet Oral Q4H PRN Charna Elizabeth Martensen III, PA-C   1 tablet at 09/07/16 1003  . magnesium citrate solution 1 Bottle  1 Bottle Oral Once PRN Charna Elizabeth Martensen III, PA-C      . memantine [facility]) tablet 10 mg  10 mg Oral BID  Domenic Polite, MD   10 mg at 09/07/16 0956  . metoprolol tartrate (LOPRESSOR) tablet 25 mg  25 mg Oral BID Domenic Polite, MD   25 mg at 09/07/16 0959  . morphine 4 MG/ML injection 0.52 mg  0.52 mg Intravenous Q2H PRN Etta Quill, DO   0.52 mg at 09/05/16 2343  . ondansetron (ZOFRAN) tablet 4 mg  4 mg Oral Q6H PRN Charna Elizabeth Martensen III, PA-C       Or  . ondansetron [facility]) injection 4 mg  4 mg Intravenous Q6H PRN Charna Elizabeth Martensen III, PA-C      . polyethylene glycol (MIRALAX / GLYCOLAX) packet 17 g  17 g Oral Daily PRN Charna Elizabeth Martensen III, PA-C      . senna (SENOKOT) tablet 8.6 mg  1 tablet Oral BID Charna Elizabeth Martensen III, PA-C   8.6 mg at 09/07/16 0956  . simvastatin (ZOCOR) tablet 40 mg  40 mg Oral QHS Etta Quill, DO   40 mg at 09/06/16 2206  . sorbitol 70 % solution 30 mL  30 mL Oral Daily PRN Prudencio Burly III, PA-C         Discharge Medications: Please see discharge summary for a list of discharge medications.  Relevant Imaging Results:  Relevant Lab Results:   Additional Information SSN: [facility] [place], [place]

## 2016-09-07 NOTE — Progress Notes (Signed)
PROGRESS NOTE    [name]  [id number] DOB: 1928-10-07 DOA: 09/05/2016 PCP: Glo Herring., MD  Brief Narrative: TRENEE Miles is a 83 y.o. female with medical history significant of HTN, dementia with short term memory loss.  [place].  L hip pain and LLE deformity post fall.  Xray revealed L intertroch fx of hip.  Assessment & Plan:   1. L hip fx - -low cardiac risk for surgery -s/p L hip IM nail -PT/SNF for rehab -lovenox for DVT prophylaxis for 3-4weeks  2. Acute blood loss anemia -post op and dilution, down to 8.5 from 10.1, check CBC in am  3. HTN -hold ARB and hctz -resume metoprolol  3. Dementia -stable, resides at ALF           -resumed namenda and aricept  DVT prophylaxis: lovenox  Code Status: Full Family Communication: son at bedside  Consultants:   Ortho   Procedures:    Subjective: Feels ok, no complaints  Objective: Vitals:   09/06/16 1725 09/06/16 2110 09/07/16 0002 09/07/16 0447  BP: (!) 161/69 (!) 142/75 (!) 141/57 (!) 165/61  Pulse: 84 88 77 84  Resp: 14 16 16 16   Temp: 98.3 F (36.8 C) 99.6 F (37.6 C) 99.9 F (37.7 C) 98.9 F (37.2 C)  TempSrc:  Oral Oral Oral  SpO2: 96% 94% 97% 93%  Weight:      Height:        Intake/Output Summary (Last 24 hours) at 09/07/16 1121 Last data filed at 09/07/16 [id number]  Gross per 24 hour  Intake             1630 ml  Output             1300 ml  Net              330 ml   Filed Weights   09/05/16 1935  Weight: 64.4 kg (142 lb)    Examination:  General exam: Appears calm and comfortable, AAOx3, oriented to self and place only, pleasant  Respiratory system: Clear to auscultation. Respiratory effort normal. Cardiovascular system: S1 & S2 heard, RRR. No JVD, murmurs, rubs, gallops or clicks. No pedal edema. Gastrointestinal system: Abdomen is nondistended, soft and nontender. Normal bowel sounds heard. Central nervous system: Alert and  oriented x2. No focal neurological deficits. Extremities: Symmetric 5 x 5 power, surg site dressing c/d/i Skin: No rashes, lesions or ulcers Psychiatry: Judgement and insight appear normal. Mood & affect appropriate.     Data Reviewed: I have personally reviewed following labs and imaging studies  CBC:  Recent Labs Lab 09/05/16 2027 09/05/16 2048 09/06/16 0314 09/07/16 0946  WBC 13.8*  --  13.4* 9.4  NEUTROABS 11.3*  --   --   --   HGB 10.5* 10.5* 10.1* 8.5*  HCT 30.8* 31.0* 29.3* 25.9*  MCV 72.8*  --  74.2* 74.6*  PLT 186  --  204 [id number]*   Basic Metabolic Panel:  Recent Labs Lab 09/05/16 2048 09/06/16 0314 09/07/16 0946  NA 141 139 138  K 3.3* 3.6 3.8  CL 105 103 104  CO2  --  27 25  GLUCOSE 150* 159* 142*  BUN 10 11 10   CREATININE 0.70 0.77 0.61  CALCIUM  --  8.5* 7.9*   GFR: Estimated Creatinine Clearance: 44.5 mL/min (by C-G formula based on SCr of 0.61 mg/dL). Liver Function Tests: No results for input(s): AST, ALT, ALKPHOS, BILITOT, PROT, ALBUMIN in the  last 168 hours. No results for input(s): LIPASE, AMYLASE in the last 168 hours. No results for input(s): AMMONIA in the last 168 hours. Coagulation Profile:  Recent Labs Lab 09/05/16 2031  INR 1.06   Cardiac Enzymes: No results for input(s): CKTOTAL, CKMB, CKMBINDEX, TROPONINI in the last 168 hours. BNP (last 3 results) No results for input(s): PROBNP in the last 8760 hours. HbA1C: No results for input(s): HGBA1C in the last 72 hours. CBG:  Recent Labs Lab 09/06/16 1256 09/06/16 1621 09/06/16 2114 09/07/16 0631 09/07/16 1102  GLUCAP 130* 129* 157* 136* 148*   Lipid Profile: No results for input(s): CHOL, HDL, LDLCALC, TRIG, CHOLHDL, LDLDIRECT in the last 72 hours. Thyroid Function Tests: No results for input(s): TSH, T4TOTAL, FREET4, T3FREE, THYROIDAB in the last 72 hours. Anemia Panel: No results for input(s): VITAMINB12, FOLATE, FERRITIN, TIBC, IRON, RETICCTPCT in the last 72  hours. Urine analysis:    Component Value Date/Time   COLORURINE YELLOW 11/21/2015 1550   APPEARANCEUR CLEAR 11/21/2015 1550   LABSPEC <1.005 (L) 11/21/2015 1550   PHURINE 5.5 11/21/2015 1550   GLUCOSEU NEGATIVE 11/21/2015 1550   HGBUR NEGATIVE 11/21/2015 1550   BILIRUBINUR NEGATIVE 11/21/2015 1550   KETONESUR NEGATIVE 11/21/2015 1550   PROTEINUR NEGATIVE 11/21/2015 1550   UROBILINOGEN 0.2 01/18/2011 2222   NITRITE NEGATIVE 11/21/2015 1550   LEUKOCYTESUR SMALL (A) 11/21/2015 1550   Sepsis Labs: @LABRCNTIP (procalcitonin:4,lacticidven:4)  ) Recent Results (from the past 240 hour(s))  Surgical pcr screen     Status: None   Collection Time: 09/06/16 12:18 AM  Result Value Ref Range Status   MRSA, PCR NEGATIVE NEGATIVE Final   Staphylococcus aureus NEGATIVE NEGATIVE Final    Comment:        The Xpert SA Assay (FDA approved for NASAL specimens in patients over 69 years of age), is one component of a comprehensive surveillance program.  Test performance has been validated by [facility] for patients greater than or equal to 42 year old. It is not intended to diagnose infection nor to guide or monitor treatment.          Radiology Studies: Dg Chest 1 View  Result Date: 09/05/2016 CLINICAL DATA:  Preoperative chest radiograph. Left hip deformity. Initial encounter. EXAM: CHEST 1 VIEW COMPARISON:  Chest radiograph performed 12/19/2009 FINDINGS: The lungs are well-aerated. Peribronchial thickening is noted. A calcified granuloma is noted at the left lung base. There is no evidence of focal opacification, pleural effusion or pneumothorax. The cardiomediastinal silhouette is mildly enlarged. No acute osseous abnormalities are seen. IMPRESSION: Peribronchial thickening noted. Mild cardiomegaly. No displaced rib fracture seen. Electronically Signed   By: Garald Balding M.D.   On: 09/05/2016 20:20   Ct Head Wo Contrast  Result Date: 09/05/2016 CLINICAL DATA:  Patient fell on the  way of the bathroom. EXAM: CT HEAD WITHOUT CONTRAST TECHNIQUE: Contiguous axial images were obtained from the base of the skull through the vertex without intravenous contrast. COMPARISON:  12/19/2009 FINDINGS: Brain: There is no evidence for acute hemorrhage, hydrocephalus, mass lesion, or abnormal extra-axial fluid collection. No definite CT evidence for acute infarction. Diffuse loss of parenchymal volume is consistent with atrophy. Patchy low attenuation in the deep hemispheric and periventricular white matter is nonspecific, but likely reflects chronic microvascular ischemic demyelination. Vascular: Atherosclerotic calcification is visualized in the carotid arteries. No dense MCA sign. Major dural sinuses are unremarkable. Skull: No evidence for fracture. No worrisome lytic or sclerotic lesion. Sinuses/Orbits: The visualized paranasal sinuses and mastoid air cells are  clear. Visualized portions of the globes and intraorbital fat are unremarkable. Other: None. IMPRESSION: 1. Stable exam.  No acute intracranial abnormality. 2. Atrophy with chronic small vessel white matter ischemic disease. Electronically Signed   By: Misty Stanley M.D.   On: 09/05/2016 20:32   Dg C-arm 1-60 Min  Result Date: 09/06/2016 CLINICAL DATA:  ORIF left femur fracture. EXAM: LEFT FEMUR 2 VIEWS; DG C-ARM 61-120 MIN COMPARISON:  Radiography yesterday. FINDINGS: Multiple C-arm images show gamma nail placement for treatment of comminuted intertrochanteric and subtrochanteric fracture of the left femur. Components are well-positioned. Alignment is near anatomic. Distal locking screw. IMPRESSION: Good appearance following ORIF left femur fracture. Electronically Signed   By: Nelson Chimes M.D.   On: 09/06/2016 16:24   Dg Hip Port Unilat With Pelvis 1v Left  Result Date: 09/06/2016 CLINICAL DATA:  Post left intramedullary nail. EXAM: DG HIP (WITH OR WITHOUT PELVIS) 1V PORT LEFT COMPARISON:  09/05/2016 FINDINGS: Interval fixation of  patient's left trochanteric/subtrochanteric fracture with left femoral intramedullary nail and associated screw bridging the femoral neck into the femoral head as hardware is intact. There is anatomic alignment about the fracture site. Remainder of the exam is unchanged. IMPRESSION: Fixation of patient's left trochanteric/ subtrochanteric fracture with hardware intact. Electronically Signed   By: Marin Olp M.D.   On: 09/06/2016 17:16   Dg Hip Unilat With Pelvis 2-3 Views Left  Result Date: 09/05/2016 CLINICAL DATA:  Left hip deformity after fall. EXAM: DG HIP (WITH OR WITHOUT PELVIS) 2-3V LEFT; LEFT FEMUR 2 VIEWS COMPARISON:  None. FINDINGS: Left hip: Intertrochanteric left proximal femur fracture with moderate coxa vara deformity. No left hip dislocation. Lucency in the lateral aspect of the left symphysis pubis may represent a nondisplaced fracture. Advanced degenerative changes of the lower lumbar spine. Left femur: Intertrochanteric left proximal femur fracture with moderate coxa vara deformity. The left knee joint is maintained. No additional femoral fracture. IMPRESSION: Intertrochanteric left proximal femur fracture with moderate coxa vara deformity. No left hip dislocation. Probable nondisplaced fracture of left pubis symphysis. Electronically Signed   By: Kristine Garbe M.D.   On: 09/05/2016 20:23   Dg Femur Min 2 Views Left  Result Date: 09/06/2016 CLINICAL DATA:  ORIF left femur fracture. EXAM: LEFT FEMUR 2 VIEWS; DG C-ARM 61-120 MIN COMPARISON:  Radiography yesterday. FINDINGS: Multiple C-arm images show gamma nail placement for treatment of comminuted intertrochanteric and subtrochanteric fracture of the left femur. Components are well-positioned. Alignment is near anatomic. Distal locking screw. IMPRESSION: Good appearance following ORIF left femur fracture. Electronically Signed   By: Nelson Chimes M.D.   On: 09/06/2016 16:24   Dg Femur Min 2 Views Left  Result Date:  09/05/2016 CLINICAL DATA:  Left hip deformity after fall. EXAM: DG HIP (WITH OR WITHOUT PELVIS) 2-3V LEFT; LEFT FEMUR 2 VIEWS COMPARISON:  None. FINDINGS: Left hip: Intertrochanteric left proximal femur fracture with moderate coxa vara deformity. No left hip dislocation. Lucency in the lateral aspect of the left symphysis pubis may represent a nondisplaced fracture. Advanced degenerative changes of the lower lumbar spine. Left femur: Intertrochanteric left proximal femur fracture with moderate coxa vara deformity. The left knee joint is maintained. No additional femoral fracture. IMPRESSION: Intertrochanteric left proximal femur fracture with moderate coxa vara deformity. No left hip dislocation. Probable nondisplaced fracture of left pubis symphysis. Electronically Signed   By: Kristine Garbe M.D.   On: 09/05/2016 20:23        Scheduled Meds: . amitriptyline  10 mg Oral  QHS  . bupivacaine (PF)  30 mL Infiltration Once  . docusate sodium  100 mg Oral BID  . donepezil  5 mg Oral QHS  . enoxaparin (LOVENOX) injection  40 mg Subcutaneous Q24H  . feeding supplement (GLUCERNA SHAKE)  237 mL Oral BID BM  . memantine  10 mg Oral BID  . metoprolol tartrate  25 mg Oral BID  . senna  1 tablet Oral BID  . simvastatin  40 mg Oral QHS   Continuous Infusions:   LOS: 2 days    Time spent: 40min    Domenic Polite, MD [facility] Pager [phone]  If 7PM-7AM, please contact night-coverage [email or web address] Password [email or web address] 09/07/2016, 11:21 AM

## 2016-09-07 NOTE — Evaluation (Signed)
Physical Therapy Evaluation Patient Details Name: [name] MRN: [id number] DOB: May 31, 1929 Today's Date: 09/07/2016   History of Present Illness  Pt is an 83 y.o. female who presented to the ED after a fall on her L side and now s/p L hip intramedullary nail. She has a PMH significant for HTN and dementia.   Clinical Impression  Pt is POD 1 following the above procedure. Prior to admission, pt was living in an ALF and was mostly independent. Pt required Max A for majority of mobility this session mostly due to fear & right knee buckling during stance phase while pivoting to chair. Pt will benefit from continued acute PT services to address the below deficits prior to discharge to recommended venue listed below.     Follow Up Recommendations SNF;Supervision/Assistance - 24 hour    Equipment Recommendations  None recommended by PT    Recommendations for Other Services       Precautions / Restrictions Precautions Precautions: Fall Restrictions Weight Bearing Restrictions: Yes LLE Weight Bearing: Weight bearing as tolerated      Mobility  Bed Mobility Overal bed mobility: Needs Assistance Bed Mobility: Supine to Sit     Supine to sit: Max assist;+2 for physical assistance     General bed mobility comments: Max assist for B LE's and trunk.  Transfers Overall transfer level: Needs assistance Equipment used: Rolling walker (2 wheeled) Transfers: Sit to/from [facility] Sit to Stand: +2 physical assistance;Mod assist;Max assist Stand pivot transfers: +2 physical assistance;Mod assist       General transfer comment: Max lifting assist initially for sit<>stand. Mod +2 assist for stand-pivot. L knee buckles.  Ambulation/Gait                Stairs            Wheelchair Mobility    Modified Rankin (Stroke Patients Only)       Balance Overall balance assessment: Needs assistance Sitting-balance support: Bilateral upper extremity  supported;Feet supported;No upper extremity supported Sitting balance-Leahy Scale: Fair Sitting balance - Comments: Able to statically sit without UE support.   Standing balance support: Bilateral upper extremity supported;During functional activity Standing balance-Leahy Scale: Poor Standing balance comment: Reliant on B UE support and external assistance.                             Pertinent Vitals/Pain Pain Assessment: 0-10 Pain Score: 7  Pain Location: R hip Pain Descriptors / Indicators: Aching;Sore;Operative site guarding Pain Intervention(s): Monitored during session;Limited activity within patient's tolerance;Repositioned    Home Living Family/patient expects to be discharged to:: Skilled nursing facility                 Additional Comments: Pt is a resident of an ALF but was independent in dressing/bathing and ambulation.    Prior Function Level of Independence: Independent         Comments: Resides at ALF where she receives assistance with cooking/cleaning and IADL due to memory problems. Was ambulating and completing basic ADL independently.     Hand Dominance   Dominant Hand: Right    Extremity/Trunk Assessment   Upper Extremity Assessment Upper Extremity Assessment: Defer to OT evaluation    Lower Extremity Assessment Lower Extremity Assessment: RLE deficits/detail RLE Deficits / Details: pt with normal post op pain and weakness. At least 3/5 ankle and 2/5 knee and hip per gross functional assessment RLE: Unable to fully assess due  to pain       Communication   Communication: No difficulties  Cognition Arousal/Alertness: Awake/alert Behavior During Therapy: WFL for tasks assessed/performed Overall Cognitive Status: History of cognitive impairments - at baseline                 General Comments: Dementia with short-term memory loss.    General Comments      Exercises Total Joint Exercises Ankle Circles/Pumps:  AROM;Both;20 reps;Supine   Assessment/Plan    PT Assessment Patient needs continued PT services  PT Problem List Decreased strength;Decreased range of motion;Decreased activity tolerance;Decreased balance;Decreased mobility;Decreased knowledge of use of DME;Pain       PT Treatment Interventions DME instruction;Gait training;Functional mobility training;Therapeutic activities;Therapeutic exercise;Balance training    PT Goals (Current goals can be found in the Care Plan section)  Acute Rehab PT Goals Patient Stated Goal: to have less pain PT Goal Formulation: With patient/family Time For Goal Achievement: 09/14/16 Potential to Achieve Goals: Good    Frequency Min 3X/week   Barriers to discharge        Co-evaluation PT/OT/SLP Co-Evaluation/Treatment: Yes Reason for Co-Treatment: Complexity of the patient's impairments (multi-system involvement) PT goals addressed during session: Mobility/safety with mobility OT goals addressed during session: ADL's and self-care       End of Session Equipment Utilized During Treatment: Gait belt Activity Tolerance: Patient limited by pain Patient left: in chair;with call bell/phone within reach;with family/visitor present Nurse Communication: Mobility status PT Visit Diagnosis: History of falling (Z91.81);Difficulty in walking, not elsewhere classified (R26.2);Pain Pain - Right/Left: Right Pain - part of body: Hip         Time: 1000-1027 PT Time Calculation (min) (ACUTE ONLY): 27 min   Charges:   PT Evaluation $PT Eval Moderate Complexity: 1 Procedure     PT G Codes:         Scheryl Marten PT, DPT  [phone]  09/07/2016, 12:45 PM

## 2016-09-08 ENCOUNTER — Encounter (HOSPITAL_COMMUNITY): Payer: Self-pay | Admitting: Physician Assistant

## 2016-09-08 DIAGNOSIS — D62 Acute posthemorrhagic anemia: Secondary | ICD-10-CM

## 2016-09-08 HISTORY — DX: Acute posthemorrhagic anemia: D62

## 2016-09-08 LAB — BASIC METABOLIC PANEL
ANION GAP: 9 (ref 5–15)
BUN: 10 mg/dL (ref 6–20)
CALCIUM: 8 mg/dL — AB (ref 8.9–10.3)
CO2: 28 mmol/L (ref 22–32)
CREATININE: 0.61 mg/dL (ref 0.44–1.00)
Chloride: 100 mmol/L — ABNORMAL LOW (ref 101–111)
Glucose, Bld: 131 mg/dL — ABNORMAL HIGH (ref 65–99)
Potassium: 3.3 mmol/L — ABNORMAL LOW (ref 3.5–5.1)
SODIUM: 137 mmol/L (ref 135–145)

## 2016-09-08 LAB — CBC
HEMATOCRIT: 24.5 % — AB (ref 36.0–46.0)
Hemoglobin: 7.8 g/dL — ABNORMAL LOW (ref 12.0–15.0)
MCH: 22.4 pg — AB (ref 26.0–34.0)
MCHC: 31.8 g/dL (ref 30.0–36.0)
MCV: 70.4 fL — AB (ref 78.0–100.0)
Platelets: 148 10*3/uL — ABNORMAL LOW (ref 150–400)
RBC: 3.48 MIL/uL — ABNORMAL LOW (ref 3.87–5.11)
RDW: 17.2 % — AB (ref 11.5–15.5)
WBC: 9.1 10*3/uL (ref 4.0–10.5)

## 2016-09-08 LAB — GLUCOSE, CAPILLARY
GLUCOSE-CAPILLARY: 130 mg/dL — AB (ref 65–99)
Glucose-Capillary: 136 mg/dL — ABNORMAL HIGH (ref 65–99)

## 2016-09-08 LAB — PREPARE RBC (CROSSMATCH)

## 2016-09-08 MED ORDER — FUROSEMIDE 10 MG/ML IJ SOLN: 20.0000 mg | Freq: Once | INTRAMUSCULAR | Status: DC

## 2016-09-08 MED ORDER — DOCUSATE SODIUM 100 MG PO CAPS
100.0000 mg | ORAL_CAPSULE | Freq: Two times a day (BID) | ORAL | Status: DC
  Administered 2016-09-08 – 2016-09-09 (×3): 100 mg via ORAL
  Filled 2016-09-08 (×3): qty 1

## 2016-09-08 MED ORDER — FUROSEMIDE 10 MG/ML IJ SOLN
20.0000 mg | Freq: Once | INTRAMUSCULAR | Status: AC
  Administered 2016-09-08: 20 mg via INTRAVENOUS
  Filled 2016-09-08: qty 2

## 2016-09-08 MED ORDER — POLYETHYLENE GLYCOL 3350 17 G PO PACK
17.0000 g | PACK | Freq: Two times a day (BID) | ORAL | Status: DC
  Administered 2016-09-08 – 2016-09-09 (×2): 17 g via ORAL
  Filled 2016-09-08 (×2): qty 1

## 2016-09-08 MED ORDER — SODIUM CHLORIDE 0.9 % IV SOLN: Freq: Once | INTRAVENOUS | Status: DC

## 2016-09-08 MED ORDER — POTASSIUM CHLORIDE CRYS ER 20 MEQ PO TBCR
20.0000 meq | EXTENDED_RELEASE_TABLET | Freq: Two times a day (BID) | ORAL | Status: DC
  Administered 2016-09-08 – 2016-09-09 (×3): 20 meq via ORAL
  Filled 2016-09-08 (×3): qty 1

## 2016-09-08 MED ORDER — SODIUM CHLORIDE 0.9 % IV SOLN
Freq: Once | INTRAVENOUS | Status: AC
  Administered 2016-09-08: 11:00:00 via INTRAVENOUS

## 2016-09-08 MED ORDER — POLYETHYLENE GLYCOL 3350 17 G PO PACK
17.0000 g | PACK | Freq: Every day | ORAL | Status: DC
  Administered 2016-09-08: 17 g via ORAL
  Filled 2016-09-08: qty 1

## 2016-09-08 NOTE — Progress Notes (Signed)
PROGRESS NOTE    [name]  [id number] DOB: [birth year] DOA: 09/05/2016 PCP: Glo Herring., MD  Brief Narrative: [name] is a 83 y.o. female with medical history significant of HTN, dementia with short term memory loss.  [place]  L hip pain and LLE deformity post fall.  Xray revealed L intertroch fx of hip.  Assessment & Plan:   1. L hip fx - -low cardiac risk for surgery -s/p L hip IM nail -PT/SNF for rehab -lovenox for DVT prophylaxis for 3-4weeks  2. Acute blood loss anemia -post op and dilution, down to 7.8 this am from 8.5 on 2/24 and 10.5 on admission -will transfuse 1unit PRBC, FU CBC in am  3. HTN -held ARB and hctz -resume metoprolol  3. Dementia -stable, resides at ALF           -resumed namenda and aricept  DVT prophylaxis: lovenox  Code Status: Full Family Communication: son at bedside Dispo: SNF tomorrow if stable  Consultants:   Ortho   Procedures:    Subjective: Feels weak, no other complaints  Objective: Vitals:   09/07/16 0447 09/07/16 1341 09/07/16 2017 09/08/16 0344  BP: (!) 165/61 (!) 145/73 (!) 149/60 (!) 111/55  Pulse: 84 90 76 72  Resp: 16 17 16 16   Temp: 98.9 F (37.2 C) 98.4 F (36.9 C) 99.4 F (37.4 C) 98.5 F (36.9 C)  TempSrc: Oral Oral Oral Oral  SpO2: 93% 95% 92% 91%  Weight:      Height:        Intake/Output Summary (Last 24 hours) at 09/08/16 0933 Last data filed at 09/07/16 1700  Gross per 24 hour  Intake              480 ml  Output              200 ml  Net              280 ml   Filed Weights   09/05/16 1935  Weight: 64.4 kg (142 lb)    Examination:  General exam: Appears calm and comfortable, AAOx3, oriented to self and place only, pleasant  Respiratory system: Clear to auscultation. Respiratory effort normal. Cardiovascular system: S1 & S2 heard, RRR. No JVD, murmurs, rubs, gallops or clicks. No pedal edema. Gastrointestinal system:  Abdomen is nondistended, soft and nontender. Normal bowel sounds heard. Central nervous system: Alert and oriented x2. No focal neurological deficits. Extremities: Symmetric 5 x 5 power, surg site dressing c/d/i Skin: No rashes, lesions or ulcers Psychiatry: Judgement and insight appear normal. Mood & affect appropriate.     Data Reviewed: I have personally reviewed following labs and imaging studies  CBC:  Recent Labs Lab 09/05/16 2027 09/05/16 2048 09/06/16 0314 09/07/16 0946 09/08/16 0705  WBC 13.8*  --  13.4* 9.4 9.1  NEUTROABS 11.3*  --   --   --   --   HGB 10.5* 10.5* 10.1* 8.5* 7.8*  HCT 30.8* 31.0* 29.3* 25.9* 24.5*  MCV 72.8*  --  74.2* 74.6* 70.4*  PLT 186  --  204 135* [id number]*   Basic Metabolic Panel:  Recent Labs Lab 09/05/16 2048 09/06/16 0314 09/07/16 0946 09/08/16 0418  NA 141 139 138 137  K 3.3* 3.6 3.8 3.3*  CL 105 103 104 100*  CO2  --  27 25 28   GLUCOSE 150* 159* 142* 131*  BUN 10 11 10 10   CREATININE 0.70 0.77 0.61 0.61  CALCIUM  --  8.5* 7.9* 8.0*   GFR: Estimated Creatinine Clearance: 44.5 mL/min (by C-G formula based on SCr of 0.61 mg/dL). Liver Function Tests: No results for input(s): AST, ALT, ALKPHOS, BILITOT, PROT, ALBUMIN in the last 168 hours. No results for input(s): LIPASE, AMYLASE in the last 168 hours. No results for input(s): AMMONIA in the last 168 hours. Coagulation Profile:  Recent Labs Lab 09/05/16 2031  INR 1.06   Cardiac Enzymes: No results for input(s): CKTOTAL, CKMB, CKMBINDEX, TROPONINI in the last 168 hours. BNP (last 3 results) No results for input(s): PROBNP in the last 8760 hours. HbA1C: No results for input(s): HGBA1C in the last 72 hours. CBG:  Recent Labs Lab 09/06/16 2114 09/07/16 0631 09/07/16 1102 09/07/16 2050 09/08/16 0615  GLUCAP 157* 136* 148* 182* 136*   Lipid Profile: No results for input(s): CHOL, HDL, LDLCALC, TRIG, CHOLHDL, LDLDIRECT in the last 72 hours. Thyroid Function Tests: No  results for input(s): TSH, T4TOTAL, FREET4, T3FREE, THYROIDAB in the last 72 hours. Anemia Panel: No results for input(s): VITAMINB12, FOLATE, FERRITIN, TIBC, IRON, RETICCTPCT in the last 72 hours. Urine analysis:    Component Value Date/Time   COLORURINE YELLOW 11/21/2015 1550   APPEARANCEUR CLEAR 11/21/2015 1550   LABSPEC <1.005 (L) 11/21/2015 1550   PHURINE 5.5 11/21/2015 1550   GLUCOSEU NEGATIVE 11/21/2015 1550   HGBUR NEGATIVE 11/21/2015 1550   BILIRUBINUR NEGATIVE 11/21/2015 1550   KETONESUR NEGATIVE 11/21/2015 1550   PROTEINUR NEGATIVE 11/21/2015 1550   UROBILINOGEN 0.2 01/18/2011 2222   NITRITE NEGATIVE 11/21/2015 1550   LEUKOCYTESUR SMALL (A) 11/21/2015 1550   Sepsis Labs: @LABRCNTIP (procalcitonin:4,lacticidven:4)  ) Recent Results (from the past 240 hour(s))  Surgical pcr screen     Status: None   Collection Time: 09/06/16 12:18 AM  Result Value Ref Range Status   MRSA, PCR NEGATIVE NEGATIVE Final   Staphylococcus aureus NEGATIVE NEGATIVE Final    Comment:        The Xpert SA Assay (FDA approved for NASAL specimens in patients over 4 years of age), is one component of a comprehensive surveillance program.  Test performance has been validated by [facility] for patients greater than or equal to 27 year old. It is not intended to diagnose infection nor to guide or monitor treatment.          Radiology Studies: Dg C-arm 1-60 Min  Result Date: 09/06/2016 CLINICAL DATA:  ORIF left femur fracture. EXAM: LEFT FEMUR 2 VIEWS; DG C-ARM 61-120 MIN COMPARISON:  Radiography yesterday. FINDINGS: Multiple C-arm images show gamma nail placement for treatment of comminuted intertrochanteric and subtrochanteric fracture of the left femur. Components are well-positioned. Alignment is near anatomic. Distal locking screw. IMPRESSION: Good appearance following ORIF left femur fracture. Electronically Signed   By: Nelson Chimes M.D.   On: 09/06/2016 16:24   Dg Hip Port Unilat  With Pelvis 1v Left  Result Date: 09/06/2016 CLINICAL DATA:  Post left intramedullary nail. EXAM: DG HIP (WITH OR WITHOUT PELVIS) 1V PORT LEFT COMPARISON:  09/05/2016 FINDINGS: Interval fixation of patient's left trochanteric/subtrochanteric fracture with left femoral intramedullary nail and associated screw bridging the femoral neck into the femoral head as hardware is intact. There is anatomic alignment about the fracture site. Remainder of the exam is unchanged. IMPRESSION: Fixation of patient's left trochanteric/ subtrochanteric fracture with hardware intact. Electronically Signed   By: Marin Olp M.D.   On: 09/06/2016 17:16   Dg Femur Min 2 Views Left  Result Date: 09/06/2016 CLINICAL DATA:  ORIF left femur fracture. EXAM: LEFT FEMUR 2 VIEWS; DG C-ARM 61-120 MIN COMPARISON:  Radiography yesterday. FINDINGS: Multiple C-arm images show gamma nail placement for treatment of comminuted intertrochanteric and subtrochanteric fracture of the left femur. Components are well-positioned. Alignment is near anatomic. Distal locking screw. IMPRESSION: Good appearance following ORIF left femur fracture. Electronically Signed   By: Nelson Chimes M.D.   On: 09/06/2016 16:24        Scheduled Meds: . sodium chloride   Intravenous Once  . amitriptyline  10 mg Oral QHS  . bupivacaine (PF)  30 mL Infiltration Once  . donepezil  5 mg Oral QHS  . enoxaparin (LOVENOX) injection  40 mg Subcutaneous Q24H  . feeding supplement (GLUCERNA SHAKE)  237 mL Oral BID BM  . memantine  10 mg Oral BID  . metoprolol tartrate  25 mg Oral BID  . polyethylene glycol  17 g Oral Daily  . senna  1 tablet Oral BID  . simvastatin  40 mg Oral QHS   Continuous Infusions:   LOS: 3 days    Time spent: 30min    Domenic Polite, MD [facility] Pager [phone]  If 7PM-7AM, please contact night-coverage [email or web address] Password [email or web address] 09/08/2016, 9:33 AM

## 2016-09-08 NOTE — Progress Notes (Addendum)
Subjective: 2 Days Post-Op Procedure(s) (LRB): LEFT HIP INTRAMEDULLARY (IM) NAIL (Left) Patient reports pain as 6 on 0-10 scale.    Objective: Vital signs in last 24 hours: Temp:  [98.4 F (36.9 C)-99.4 F (37.4 C)] 98.4 F (36.9 C) (02/25 1445) Pulse Rate:  [63-76] 67 (02/25 1445) Resp:  [16] 16 (02/25 1445) BP: (111-176)/(55-66) 158/55 (02/25 1445) SpO2:  [91 %-98 %] 97 % (02/25 1445)  Intake/Output from previous day: 02/24 0701 - 02/25 0700 In: 840 [P.O.:840] Out: 200 [Urine:200] Intake/Output this shift: Total I/O In: 240 [P.O.:240] Out: -    Recent Labs  09/05/16 2027 09/05/16 2048 09/06/16 0314 09/07/16 0946 09/08/16 0705  HGB 10.5* 10.5* 10.1* 8.5* 7.8*    Recent Labs  09/07/16 0946 09/08/16 0705  WBC 9.4 9.1  RBC 3.47* 3.48*  HCT 25.9* 24.5*  PLT 135* 148*    Recent Labs  09/07/16 0946 09/08/16 0418  NA 138 137  K 3.8 3.3*  CL 104 100*  CO2 25 28  BUN 10 10  CREATININE 0.61 0.61  GLUCOSE 142* 131*  CALCIUM 7.9* 8.0*    Recent Labs  09/05/16 2031  INR 1.06    ABD soft Neurovascular intact Sensation intact distally Intact pulses distally Dorsiflexion/Plantar flexion intact Incision: dressing C/D/I  Assessment/Plan: 2 Days Post-Op Procedure(s) (LRB): LEFT HIP INTRAMEDULLARY (IM) NAIL (Left)  Principal Problem:   Closed intertrochanteric fracture of left hip, initial encounter (HCC) Active Problems:   Diabetes mellitus due to underlying condition, controlled (HCC)   Deficiency anemia   Essential hypertension   PAROXYSMAL ATRIAL FIBRILLATION   Dementia   Postoperative anemia due to acute blood loss   I got this patient up with the assistance of her son.  She is 2+ to 3+ max assist for bed mobility.  She took 3-4 steps with max encouragement.  She was resting comfortably in the chair when I left the room.  Off of O2 her sats are in the 80's   O2 reapplied.  Son would like her at the [facility], [place] or [place].    Son works at [employer].  In regards to her postop acute blood loss anemia, I have ordered a second unit of blood and lasix between units so that she will be ready for SNF tomorrow.  I have also added Kdur for her hypokalemia and would worsen with the dose of Lasix Advance diet Up with therapy Discharge to SNFtomorrow or Tuesday  Rubina Basinski J 09/08/2016, 2:55 PM

## 2016-09-09 ENCOUNTER — Inpatient Hospital Stay
Admission: RE | Admit: 2016-09-09 | Discharge: 2016-10-30 | Disposition: A | Discharge status: Home / Self Care | Payer: Medicare Other | Source: Ambulatory Visit | Attending: Internal Medicine | Admitting: Internal Medicine

## 2016-09-09 ENCOUNTER — Other Ambulatory Visit (HOSPITAL_COMMUNITY)
Admission: AD | Admit: 2016-09-09 | Discharge: 2016-09-09 | Disposition: A | Discharge status: Home / Self Care | Payer: Medicare Other | Source: Skilled Nursing Facility | Attending: *Deleted | Admitting: *Deleted

## 2016-09-09 LAB — CBC
HEMATOCRIT: 31.3 % — AB (ref 36.0–46.0)
HEMOGLOBIN: 10.6 g/dL — AB (ref 12.0–15.0)
MCH: 25.1 pg — ABNORMAL LOW (ref 26.0–34.0)
MCHC: 33.9 g/dL (ref 30.0–36.0)
MCV: 74.2 fL — ABNORMAL LOW (ref 78.0–100.0)
Platelets: 126 10*3/uL — ABNORMAL LOW (ref 150–400)
RBC: 4.22 MIL/uL (ref 3.87–5.11)
RDW: 23.3 % — ABNORMAL HIGH (ref 11.5–15.5)
WBC: 11.5 10*3/uL — AB (ref 4.0–10.5)

## 2016-09-09 LAB — URINALYSIS, ROUTINE W REFLEX MICROSCOPIC
BILIRUBIN URINE: NEGATIVE
Glucose, UA: NEGATIVE mg/dL
Hgb urine dipstick: NEGATIVE
Ketones, ur: NEGATIVE mg/dL
Leukocytes, UA: NEGATIVE
NITRITE: NEGATIVE
PH: 8 (ref 5.0–8.0)
Protein, ur: NEGATIVE mg/dL
SPECIFIC GRAVITY, URINE: 1.01 (ref 1.005–1.030)

## 2016-09-09 LAB — GLUCOSE, CAPILLARY: Glucose-Capillary: 124 mg/dL — ABNORMAL HIGH (ref 65–99)

## 2016-09-09 LAB — TYPE AND SCREEN
Blood Product Expiration Date: 201803172359
Blood Product Expiration Date: 201803182359
ISSUE DATE / TIME: 201802251404
ISSUE DATE / TIME: 201802251738
UNIT TYPE AND RH: 5100
Unit Type and Rh: 5100

## 2016-09-09 MED ORDER — SENNA 8.6 MG PO TABS: 1.0000 | ORAL_TABLET | Freq: Every day | ORAL | 0 refills | Status: DC

## 2016-09-09 MED ORDER — POLYETHYLENE GLYCOL 3350 17 G PO PACK: 17.0000 g | PACK | Freq: Every day | ORAL | 0 refills | Status: DC | PRN

## 2016-09-09 NOTE — Progress Notes (Signed)
Patient to be discharged to [facility]. RN called and gave report to [place]. IV removed. [facility] here for transportation

## 2016-09-09 NOTE — Progress Notes (Signed)
   Assessment / Plan: 3 Days Post-Op  S/P Procedure(s) (LRB): LEFT HIP INTRAMEDULLARY (IM) NAIL (Left) by Dr. Ernesta Amble. Percell Miller on 09/06/16 Principal Problem:   Closed intertrochanteric fracture of left hip, initial encounter [facility]) Active Problems:   Diabetes mellitus due to underlying condition, controlled ([place])   Deficiency anemia   Essential hypertension   PAROXYSMAL ATRIAL FIBRILLATION   Dementia   Postoperative anemia due to acute blood loss ABLA improved s/p transfusion. Hgb 10.6.  Doing well.  Pain controlled.  OOB several times.  Stable to d/c to SNF from an orthopedic perspective.  Up with therapy Incentive Spirometry Discharge to SNF when placement confirmed.  Follow up in the office with Dr. Alain Marion in 2 weeks.  Please call with questions.  Weight Bearing: Weight Bearing as Tolerated (WBAT)  Dressings: prn.  VTE prophylaxis: Lovenox, SCDs, ambulation Dispo: Skilled Nursing Facility/Rehab   Subjective: Patient reports pain as mild. Pain controlled with PO meds.  Tolerating diet - appetite fair.  Urinating.  +Flatus, No BM yet.  No CP, SOB.   Objective:   VITALS:   Vitals:   09/08/16 1745 09/08/16 1800 09/08/16 2045 09/09/16 0412  BP:  (!) 174/58 (!) 160/76 (!) 161/71  Pulse: 69 72 90 76  Resp: 18 20 18 16   Temp: 98.4 F (36.9 C) 98.8 F (37.1 C) 98.4 F (36.9 C) 97.5 F (36.4 C)  TempSrc: Oral Oral Oral Oral  SpO2:  92% 95% 93%  Weight:      Height:       CBC Latest Ref Rng & Units 09/09/2016 09/08/2016 09/07/2016  WBC 4.0 - 10.5 K/uL 11.5(H) 9.1 9.4  Hemoglobin 12.0 - 15.0 g/dL 10.6(L) 7.8(L) 8.5(L)  Hematocrit 36.0 - 46.0 % 31.3(L) 24.5(L) 25.9(L)  Platelets 150 - 400 K/uL 126(L) 148(L) 135(L)   BMP Latest Ref Rng & Units 09/08/2016 09/07/2016 09/06/2016  Glucose 65 - 99 mg/dL 131(H) 142(H) 159(H)  BUN 6 - 20 mg/dL 10 10 11   Creatinine 0.44 - 1.00 mg/dL 0.61 0.61 0.77  Sodium 135 - 145 mmol/L 137 138 139  Potassium 3.5 - 5.1 mmol/L 3.3(L) 3.8 3.6    Chloride 101 - 111 mmol/L 100(L) 104 103  CO2 22 - 32 mmol/L 28 25 27   Calcium 8.9 - 10.3 mg/dL 8.0(L) 7.9(L) 8.5(L)   Intake/Output      02/25 0701 - 02/26 0700 02/26 0701 - 02/27 0700   P.O. 600    Blood 710    Total Intake(mL/kg) 1310 (20.3)    Urine (mL/kg/hr) 1600 (1)    Total Output 1600     Net -290          Urine Occurrence 3 x      Physical Exam: General: NAD.  Supine in bed.  Son at bedside. Resp: No increased wob Cardio: regular rate and rhythm ABD soft Neurologically intact MSK Neurovascularly intact Sensation intact distally Intact pulses distally Dorsiflexion/Plantar flexion intact Incision: dressing C/D/I  Prudencio Burly III, PA-C 09/09/2016, 7:55 AM

## 2016-09-09 NOTE — Clinical Social Work Note (Signed)
Clinical Social Worker facilitated patient discharge including contacting patient family and facility to confirm patient discharge plans.  Clinical information faxed to facility and family agreeable with plan.  CSW arranged ambulance transport via [place] to [facility].  RN to call [phone] for report prior to discharge. Patient will go to room 154.  Clinical Social Worker will sign off for now as social work intervention is no longer needed. Please consult [place] again if new need arises.  [address], [place]

## 2016-09-09 NOTE — Discharge Summary (Signed)
Physician Discharge Summary  [name] [id number] DOB: [birth year] DOA: 09/05/2016  PCP: Glo Herring., MD  Admit date: 09/05/2016 Discharge date: 09/09/2016  Time spent: 19minutes  Recommendations for Outpatient Follow-up:  1. PCP    Discharge Diagnoses:  Principal Problem:   Closed intertrochanteric fracture of left hip, initial encounter [facility]) Active Problems:   Diabetes mellitus due to underlying condition, controlled ([place])   Essential hypertension   Dementia   Postoperative anemia due to acute blood loss     Discharge Condition: stable  Diet recommendation: DM/heart healthy  Filed Weights   09/05/16 1935  Weight: 64.4 kg (142 lb)    History of present illness:  [name] a 83 y.o.femalewith medical history significant of HTN, dementia with short term memory loss. Patient [place]. L hip pain and LLE deformity post fall. Xray revealed L intertroch fx of hip.  Hospital Course:  1. L hip fx - -low cardiac risk for surgery -s/p L hip IM nail by Dr.Murphy 2/23, post op course only significant for blood loss anemia -PT consulted, SNF for rehab recommended -lovenox for DVT prophylaxis for 4weeks -stable for discharge to SNF from medical and Ortho standpoint -Ortho FU with Dr.Murphy in 2 weeks  2. Acute blood loss anemia -post op and dilution, down to 7.8 yesterday from 8.5 on 2/24 and 10.5 on admission -I gave her 1unit of blood and ortho gave her another unit of blood with lasix, Hb improved to 10 now -stable   3. HTN -resumed metoprolol  4. Dementia -stable, resides at ALF           -resumed namenda and aricept, now going to SNF for short term rehab             5. DM            -diet controlled, stable CBGs   Procedures: PROCEDURE:  LEFT HIP INTRAMEDULLARY (IM) NAIL  Consultations:  Ortho Dr.Murphy  Discharge Exam: Vitals:   09/08/16 2045 09/09/16 0412  BP: (!) 160/76 (!) 161/71  Pulse:  90 76  Resp: 18 16  Temp: 98.4 F (36.9 C) 97.5 F (36.4 C)    General: AAOx3 Cardiovascular: S1S2/RRR Respiratory: CTAB  Discharge Instructions   Discharge Instructions    Diet - low sodium heart healthy    Complete by:  As directed    Increase activity slowly    Complete by:  As directed      Current Discharge Medication List    START taking these medications   Details  baclofen (LIORESAL) 10 MG tablet Take 1 tablet (10 mg total) by mouth 3 (three) times daily as needed for muscle spasms. Qty: 40 each, Refills: 0    enoxaparin (LOVENOX) 40 MG/0.4ML injection Inject 0.4 mLs (40 mg total) into the skin daily. For 30 days post op for DVT prophylaxis Qty: 30 Syringe, Refills: 0    HYDROcodone-acetaminophen (NORCO) 5-325 MG tablet Take 1-2 tablets by mouth every 6 (six) hours as needed for moderate pain. Qty: 40 tablet, Refills: 0    ondansetron (ZOFRAN) 4 MG tablet Take 1 tablet (4 mg total) by mouth every 8 (eight) hours as needed for nausea or vomiting. Qty: 40 tablet, Refills: 0    polyethylene glycol (MIRALAX / GLYCOLAX) packet Take 17 g by mouth daily as needed. Qty: 14 each, Refills: 0    senna (SENOKOT) 8.6 MG TABS tablet Take 1 tablet (8.6 mg total) by mouth daily. Refills: 0  CONTINUE these medications which have NOT CHANGED   Details  alendronate (FOSAMAX) 70 MG tablet Take 70 mg by mouth once a week. Take with a full glass of water on an empty stomach. Take on Wednesdays    Calcium Carbonate-Vitamin D (CALCIUM + D PO) Take by mouth 2 (two) times daily.      Cranberry 425 MG CAPS Take 1 capsule by mouth 2 (two) times daily.    Dextromethorphan-Guaifenesin (ROBITUSSIN DM) 10-100 MG/5ML liquid Take 10 mLs by mouth every 12 (twelve) hours.    docusate sodium (COLACE) 100 MG capsule Take 100 mg by mouth 2 (two) times daily.    donepezil (ARICEPT) 5 MG tablet Take 5 mg by mouth at bedtime.    estradiol (VAGIFEM) 25 MCG vaginal tablet Place 25 mcg  vaginally 2 (two) times a week.      iron polysaccharides (NIFEREX) 150 MG capsule Take 150 mg by mouth 2 (two) times daily.    memantine (NAMENDA) 10 MG tablet Take 10 mg by mouth 2 (two) times daily.    vitamin B-6 (PYRIDOXINE) 25 MG tablet Take 25 mg by mouth daily.    amitriptyline (ELAVIL) 10 MG tablet Take 10 mg by mouth at bedtime.      Coenzyme Q10 (CO Q-10) 100 MG CAPS Take by mouth.      fish oil-omega-3 fatty acids 1000 MG capsule Take 2 g by mouth daily.      Glucosamine-Chondroit-Vit C-Mn (GLUCOSAMINE 1500 COMPLEX PO) Take by mouth.      metoprolol tartrate (LOPRESSOR) 25 MG tablet Take 25 mg by mouth 2 (two) times daily. 1/2 pill  Bid      risedronate (ACTONEL) 35 MG tablet Take 35 mg by mouth every 7 (seven) days. with water on empty stomach, nothing by mouth or lie down for next 30 minutes.     simvastatin (ZOCOR) 40 MG tablet Take 40 mg by mouth at bedtime.         Allergies  Allergen Reactions  . Procaine Hcl Other (See Comments)    Numbness/jerkiness  . Sulfonamide Derivatives Other (See Comments)    hypertension  . Penicillins Rash    Has patient had a PCN reaction causing immediate rash, facial/tongue/throat swelling, SOB or lightheadedness with hypotension:YES Has patient had a PCN reaction causing severe rash involving mucus membranes or skin necrosis:NO Has patient had a PCN reaction that required hospitalizationNO Has patient had a PCN reaction occurring within the last 10 years: NO If all of the above answers are "NO", then may proceed with Cephalosporin use.    Contact information for follow-up providers    MURPHY, TIMOTHY D, MD Follow up in 2 week(s).   Specialty:  Orthopedic Surgery Contact information: [address] [place] [phone]            Contact information for after-discharge care    [facility] Follow up.   Specialty:  Skilled Nursing Facility Contact  information: [address] [place] [phone]                   The results of significant diagnostics from this hospitalization (including imaging, microbiology, ancillary and laboratory) are listed below for reference.    Significant Diagnostic Studies: Dg Chest 1 View  Result Date: 09/05/2016 CLINICAL DATA:  Preoperative chest radiograph. Left hip deformity. Initial encounter. EXAM: CHEST 1 VIEW COMPARISON:  Chest radiograph performed 12/19/2009 FINDINGS: The lungs  are well-aerated. Peribronchial thickening is noted. A calcified granuloma is noted at the left lung base. There is no evidence of focal opacification, pleural effusion or pneumothorax. The cardiomediastinal silhouette is mildly enlarged. No acute osseous abnormalities are seen. IMPRESSION: Peribronchial thickening noted. Mild cardiomegaly. No displaced rib fracture seen. Electronically Signed   By: Garald Balding M.D.   On: 09/05/2016 20:20   Ct Head Wo Contrast  Result Date: 09/05/2016 CLINICAL DATA:  Patient fell on the way of the bathroom. EXAM: CT HEAD WITHOUT CONTRAST TECHNIQUE: Contiguous axial images were obtained from the base of the skull through the vertex without intravenous contrast. COMPARISON:  12/19/2009 FINDINGS: Brain: There is no evidence for acute hemorrhage, hydrocephalus, mass lesion, or abnormal extra-axial fluid collection. No definite CT evidence for acute infarction. Diffuse loss of parenchymal volume is consistent with atrophy. Patchy low attenuation in the deep hemispheric and periventricular white matter is nonspecific, but likely reflects chronic microvascular ischemic demyelination. Vascular: Atherosclerotic calcification is visualized in the carotid arteries. No dense MCA sign. Major dural sinuses are unremarkable. Skull: No evidence for fracture. No worrisome lytic or sclerotic lesion. Sinuses/Orbits: The visualized paranasal sinuses and mastoid air cells are  clear. Visualized portions of the globes and intraorbital fat are unremarkable. Other: None. IMPRESSION: 1. Stable exam.  No acute intracranial abnormality. 2. Atrophy with chronic small vessel white matter ischemic disease. Electronically Signed   By: Misty Stanley M.D.   On: 09/05/2016 20:32   Dg C-arm 1-60 Min  Result Date: 09/06/2016 CLINICAL DATA:  ORIF left femur fracture. EXAM: LEFT FEMUR 2 VIEWS; DG C-ARM 61-120 MIN COMPARISON:  Radiography yesterday. FINDINGS: Multiple C-arm images show gamma nail placement for treatment of comminuted intertrochanteric and subtrochanteric fracture of the left femur. Components are well-positioned. Alignment is near anatomic. Distal locking screw. IMPRESSION: Good appearance following ORIF left femur fracture. Electronically Signed   By: Nelson Chimes M.D.   On: 09/06/2016 16:24   Dg Hip Port Unilat With Pelvis 1v Left  Result Date: 09/06/2016 CLINICAL DATA:  Post left intramedullary nail. EXAM: DG HIP (WITH OR WITHOUT PELVIS) 1V PORT LEFT COMPARISON:  09/05/2016 FINDINGS: Interval fixation of patient's left trochanteric/subtrochanteric fracture with left femoral intramedullary nail and associated screw bridging the femoral neck into the femoral head as hardware is intact. There is anatomic alignment about the fracture site. Remainder of the exam is unchanged. IMPRESSION: Fixation of patient's left trochanteric/ subtrochanteric fracture with hardware intact. Electronically Signed   By: Marin Olp M.D.   On: 09/06/2016 17:16   Dg Hip Unilat With Pelvis 2-3 Views Left  Result Date: 09/05/2016 CLINICAL DATA:  Left hip deformity after fall. EXAM: DG HIP (WITH OR WITHOUT PELVIS) 2-3V LEFT; LEFT FEMUR 2 VIEWS COMPARISON:  None. FINDINGS: Left hip: Intertrochanteric left proximal femur fracture with moderate coxa vara deformity. No left hip dislocation. Lucency in the lateral aspect of the left symphysis pubis may represent a nondisplaced fracture. Advanced  degenerative changes of the lower lumbar spine. Left femur: Intertrochanteric left proximal femur fracture with moderate coxa vara deformity. The left knee joint is maintained. No additional femoral fracture. IMPRESSION: Intertrochanteric left proximal femur fracture with moderate coxa vara deformity. No left hip dislocation. Probable nondisplaced fracture of left pubis symphysis. Electronically Signed   By: Kristine Garbe M.D.   On: 09/05/2016 20:23   Dg Femur Min 2 Views Left  Result Date: 09/06/2016 CLINICAL DATA:  ORIF left femur fracture. EXAM: LEFT FEMUR 2 VIEWS; DG C-ARM 61-120 MIN COMPARISON:  Radiography yesterday. FINDINGS: Multiple C-arm images show gamma nail placement for treatment of comminuted intertrochanteric and subtrochanteric fracture of the left femur. Components are well-positioned. Alignment is near anatomic. Distal locking screw. IMPRESSION: Good appearance following ORIF left femur fracture. Electronically Signed   By: Nelson Chimes M.D.   On: 09/06/2016 16:24   Dg Femur Min 2 Views Left  Result Date: 09/05/2016 CLINICAL DATA:  Left hip deformity after fall. EXAM: DG HIP (WITH OR WITHOUT PELVIS) 2-3V LEFT; LEFT FEMUR 2 VIEWS COMPARISON:  None. FINDINGS: Left hip: Intertrochanteric left proximal femur fracture with moderate coxa vara deformity. No left hip dislocation. Lucency in the lateral aspect of the left symphysis pubis may represent a nondisplaced fracture. Advanced degenerative changes of the lower lumbar spine. Left femur: Intertrochanteric left proximal femur fracture with moderate coxa vara deformity. The left knee joint is maintained. No additional femoral fracture. IMPRESSION: Intertrochanteric left proximal femur fracture with moderate coxa vara deformity. No left hip dislocation. Probable nondisplaced fracture of left pubis symphysis. Electronically Signed   By: Kristine Garbe M.D.   On: 09/05/2016 20:23    Microbiology: Recent Results (from the  past 240 hour(s))  Surgical pcr screen     Status: None   Collection Time: 09/06/16 12:18 AM  Result Value Ref Range Status   MRSA, PCR NEGATIVE NEGATIVE Final   Staphylococcus aureus NEGATIVE NEGATIVE Final    Comment:        The Xpert SA Assay (FDA approved for NASAL specimens in patients over 32 years of age), is one component of a comprehensive surveillance program.  Test performance has been validated by [facility] for patients greater than or equal to 36 year old. It is not intended to diagnose infection nor to guide or monitor treatment.      Labs: Basic Metabolic Panel:  Recent Labs Lab 09/05/16 2048 09/06/16 0314 09/07/16 0946 09/08/16 0418  NA 141 139 138 137  K 3.3* 3.6 3.8 3.3*  CL 105 103 104 100*  CO2  --  27 25 28   GLUCOSE 150* 159* 142* 131*  BUN 10 11 10 10   CREATININE 0.70 0.77 0.61 0.61  CALCIUM  --  8.5* 7.9* 8.0*   Liver Function Tests: No results for input(s): AST, ALT, ALKPHOS, BILITOT, PROT, ALBUMIN in the last 168 hours. No results for input(s): LIPASE, AMYLASE in the last 168 hours. No results for input(s): AMMONIA in the last 168 hours. CBC:  Recent Labs Lab 09/05/16 2027 09/05/16 2048 09/06/16 0314 09/07/16 0946 09/08/16 0705 09/09/16 0224  WBC 13.8*  --  13.4* 9.4 9.1 11.5*  NEUTROABS 11.3*  --   --   --   --   --   HGB 10.5* 10.5* 10.1* 8.5* 7.8* 10.6*  HCT 30.8* 31.0* 29.3* 25.9* 24.5* 31.3*  MCV 72.8*  --  74.2* 74.6* 70.4* 74.2*  PLT 186  --  204 135* 148* 126*   Cardiac Enzymes: No results for input(s): CKTOTAL, CKMB, CKMBINDEX, TROPONINI in the last 168 hours. BNP: BNP (last 3 results) No results for input(s): BNP in the last 8760 hours.  ProBNP (last 3 results) No results for input(s): PROBNP in the last 8760 hours.  CBG:  Recent Labs Lab 09/07/16 1102 09/07/16 2050 09/08/16 0615 09/08/16 2110 09/09/16 0549  GLUCAP 148* 182* 136* 130* 124*       SignedDomenic Polite MD.  [facility] 09/09/2016, 10:36 AM

## 2016-09-09 NOTE — Clinical Social Work Placement (Signed)
   CLINICAL SOCIAL WORK PLACEMENT  NOTE  Date:  09/09/2016  Patient Details  Name: [name] MRN: [id number] Date of Birth: [birth year]  Clinical Social Work is seeking post-discharge placement for this patient at the [place] level of care (*CSW will initial, date and re-position this form in  chart as items are completed):  Yes   Patient/family provided with [place] Work Department's list of facilities offering this level of care within the geographic area requested by the patient (or if unable, by the patient's family).  Yes   Patient/family informed of their freedom to choose among providers that offer the needed level of care, that participate in Medicare, Medicaid or managed care program needed by the patient, have an available bed and are willing to accept the patient.  Yes   Patient/family informed of [facility]'s ownership interest in [facility] and [facility], as well as of the fact that they are under no obligation to receive care at these facilities.  PASRR submitted to EDS on 09/07/16     PASRR number received on 09/07/16     Existing PASRR number confirmed on       FL2 transmitted to all facilities in geographic area requested by pt/family on 09/07/16     FL2 transmitted to all facilities within larger geographic area on       Patient informed that his/her managed care company has contracts with or will negotiate with certain facilities, including the following:        Yes   Patient/family informed of bed offers received.  Patient chooses bed at [facility]     Physician recommends and patient chooses bed at      Patient to be transferred to [facility] on 09/09/16.  Patient to be transferred to facility by [facility]     Patient family notified on 09/09/16 of transfer.  Name of family member notified:  Arnie     PHYSICIAN Please sign FL2     Additional Comment:     _______________________________________________ Alla German, LCSW 09/09/2016, 11:20 AM

## 2016-09-09 NOTE — Care Management (Signed)
Patient will be going to SNF for shortterm rehab. Social worker is arranging Bed. No CM needs. Ricki Miller, RN BSN Case Manager

## 2016-09-10 ENCOUNTER — Encounter (HOSPITAL_COMMUNITY)
Admission: RE | Admit: 2016-09-10 | Discharge: 2016-09-10 | Disposition: A | Discharge status: Home / Self Care | Payer: Medicare Other | Source: Skilled Nursing Facility | Attending: Internal Medicine | Admitting: Internal Medicine

## 2016-09-10 ENCOUNTER — Non-Acute Institutional Stay (SKILLED_NURSING_FACILITY): Payer: Medicare Other | Admitting: Internal Medicine

## 2016-09-10 ENCOUNTER — Encounter: Payer: Self-pay | Admitting: Internal Medicine

## 2016-09-10 DIAGNOSIS — D500 Iron deficiency anemia secondary to blood loss (chronic): Secondary | ICD-10-CM | POA: Diagnosis not present

## 2016-09-10 DIAGNOSIS — S72142A Displaced intertrochanteric fracture of left femur, initial encounter for closed fracture: Secondary | ICD-10-CM | POA: Diagnosis not present

## 2016-09-10 DIAGNOSIS — E089 Diabetes mellitus due to underlying condition without complications: Secondary | ICD-10-CM

## 2016-09-10 DIAGNOSIS — F0390 Unspecified dementia without behavioral disturbance: Secondary | ICD-10-CM

## 2016-09-10 DIAGNOSIS — I10 Essential (primary) hypertension: Secondary | ICD-10-CM

## 2016-09-10 LAB — BASIC METABOLIC PANEL
ANION GAP: 7 (ref 5–15)
BUN: 11 mg/dL (ref 6–20)
CHLORIDE: 103 mmol/L (ref 101–111)
CO2: 28 mmol/L (ref 22–32)
Calcium: 8.7 mg/dL — ABNORMAL LOW (ref 8.9–10.3)
Creatinine, Ser: 0.55 mg/dL (ref 0.44–1.00)
GFR calc Af Amer: >60 mL/min (ref >60)
GLUCOSE: 125 mg/dL — AB (ref 65–99)
POTASSIUM: 4 mmol/L (ref 3.5–5.1)
Sodium: 138 mmol/L (ref 135–145)

## 2016-09-10 LAB — CBC WITH DIFFERENTIAL/PLATELET
BASOS PCT: 0 %
Basophils Absolute: 0 10*3/uL (ref 0.0–0.1)
EOS PCT: 3 %
Eosinophils Absolute: 0.3 10*3/uL (ref 0.0–0.7)
HEMATOCRIT: 38.5 % (ref 36.0–46.0)
HEMOGLOBIN: 12.9 g/dL (ref 12.0–15.0)
LYMPHS PCT: 29 %
Lymphs Abs: 2.8 10*3/uL (ref 0.7–4.0)
MCH: 22.8 pg — AB (ref 26.0–34.0)
MCHC: 33.5 g/dL (ref 30.0–36.0)
MCV: 68 fL — AB (ref 78.0–100.0)
MONOS PCT: 12 %
Monocytes Absolute: 1.2 10*3/uL — ABNORMAL HIGH (ref 0.1–1.0)
NEUTROS PCT: 56 %
Neutro Abs: 5.4 10*3/uL (ref 1.7–7.7)
PLATELETS: 210 10*3/uL (ref 150–400)
RBC: 5.66 MIL/uL — AB (ref 3.87–5.11)
RDW: 20.9 % — ABNORMAL HIGH (ref 11.5–15.5)
WBC: 9.7 10*3/uL (ref 4.0–10.5)

## 2016-09-10 NOTE — Progress Notes (Signed)
Provider:  Granville Lewis Location:    [place] Room Number: 154/W Place of Service:  SNF (31)  PCP: Glo Herring., MD Patient Care Team: Redmond School, MD as PCP - General (Internal Medicine) Daneil Dolin, MD (Gastroenterology)  Extended Emergency Contact Information Primary Emergency Contact: [facility] Address: [address]          [place], [place] [address] Johnnette Litter of [place] Phone: [phone] Mobile Phone: [phone] Relation: Daughter Secondary Emergency Contact: Gasparini,Arnie  [place] of [place] Phone: [phone] Relation: Son  Code Status: DNR Goals of Care: Advanced Directive information Advanced Directives 09/10/2016  Does Patient Have a Medical Advance Directive? Yes  Type of Advance Directive Out of facility DNR (pink MOST or yellow form)  Does patient want to make changes to medical advance directive? No - Patient declined      Chief Complaint  Patient presents with  . Acute Visit  Status post hospitalization for left hip fracture with surgical repair  HPI: Patient is a 83 y.o. female seen today for admission to skilled nursing after sustaining a left hip fracture after a fall.--.  Apparently she had been in the park with her son and went to the restroom and fell in the restroom.  She was noted have pain in the left lower extremity deformity.  X-ray showed a left iintertroch fracture.  She did have an IM nailing on Feb  23rd postop course was unremarkable except for blood loss anemia.--With hemoglobin down to 7.8 she is on iron and did receive a transfusion-it is now 12.6.  She also has a history of hypertension she is on metoprolol--pressure today 128/72.  He does have dementia had been an assisted living facility previously it appears she is on Namenda and Aricept.  She also has a history of diabetes this is diet-controlled appears the hospital blood sugars were more in the mid  100s.  Currently she is resting in bed comfortably she does have hydrocodone every 6 hours as needed for pain as well as baclofen 3 times a day when necessary apparently she just recently received a muscle relaxer and apparently this is helping.  Of note yesterday  afternoon when she came in she was noted have some suprapubic distention and catheter inserted did produce  significant amount of urine-greater than 800 mL-subsequently she received an indwelling Foley catheter at some point will try a trial off this Urinalysis was also obtained which appears to be benign.          Past Medical History:  Diagnosis Date  . Atrial fibrillation ([place])   . Dementia   . Diabetes mellitus   . Diverticulitis 8/09, 3/10   abscess in 2009(medical tx)  . HTN (hypertension)   . Hypercholesteremia   . Hyperthyroidism    s/p radiation, surgery  . IDA (iron deficiency anemia)   . Osteoporosis   . Postoperative anemia due to acute blood loss 09/08/2016  . Thalassemia   . Tubular adenoma of colon 12/15/08   Colonosocpy Dr Tonita Cong removed cecum   Past Surgical History:  Procedure Laterality Date  . CESAREAN SECTION     x2  . CHOLECYSTECTOMY    . FOOT SURGERY     bilat  . INTRAMEDULLARY (IM) NAIL INTERTROCHANTERIC Left 09/06/2016   Procedure: LEFT HIP INTRAMEDULLARY (IM) NAIL;  Surgeon: Renette Butters, MD;  Location: [facility];  Service: Orthopedics;  Laterality: Left;  . THYROIDECTOMY, PARTIAL      reports that she has  never smoked. She has never used smokeless tobacco. She reports that she drinks alcohol. She reports that she does not use drugs. Social History   Social History  . Marital status: Widowed    Spouse name: N/A  . Number of children: 4  . Years of education: 10th grade   Occupational History  . 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 Smoking status: Never Smoker  . Smokeless tobacco: Never Used  . Alcohol use Yes     Comment: occasionally   . Drug use: No  .  Sexual activity: Not on file   Other Topics Concern  . Not on file   Social History Narrative   Lost 1 son-mental illness, htn age 6    Functional Status Survey:    Family History  Problem Relation Age of Onset  . Pneumonia Father   . Coronary artery disease Mother   . Stroke Mother   . Colon cancer Mother 44  . Thalassemia Sister     Health Maintenance  Topic Date Due  . FOOT EXAM  [birth year]09/1938  . OPHTHALMOLOGY EXAM  [birth year]09/1938  . URINE MICROALBUMIN  [birth year]09/1940  . TETANUS/TDAP  08/09/1949  . HEMOGLOBIN A1C  07/18/2008  . INFLUENZA VACCINE  06/14/2017 (Originally 02/13/2016)  . PNA vac Low Risk Adult (1 of 2 - PCV13) 06/14/2017 (Originally 08/10/1995)  . DEXA SCAN  Completed    Allergies  Allergen Reactions  . Procaine Hcl Other (See Comments)    Numbness/jerkiness  . Sulfonamide Derivatives Other (See Comments)    hypertension  . Penicillins Rash    Has patient had a PCN reaction causing immediate rash, facial/tongue/throat swelling, SOB or lightheadedness with hypotension:YES Has patient had a PCN reaction causing severe rash involving mucus membranes or skin necrosis:NO Has patient had a PCN reaction that required hospitalizationNO Has patient had a PCN reaction occurring within the last 10 years: NO If all of the above answers are "NO", then may proceed with Cephalosporin use.    Current Outpatient Prescriptions on File Prior to Visit  Medication Sig Dispense Refill  . alendronate (FOSAMAX) 70 MG tablet Take 70 mg by mouth once a week. Take with a full glass of water on an empty stomach. Take on Wednesdays    . amitriptyline (ELAVIL) 10 MG tablet Take 10 mg by mouth at bedtime.      . baclofen (LIORESAL) 10 MG tablet Take 1 tablet (10 mg total) by mouth 3 (three) times daily as needed for muscle spasms. 40 each 0  . Calcium Carbonate-Vitamin D (CALCIUM + D PO) Take by mouth 2 (two) times daily.      . Coenzyme Q10 (CO Q-10) 100 MG CAPS Take by mouth.      .  Cranberry 425 MG CAPS Take 1 capsule by mouth 2 (two) times daily.    Marland Kitchen Dextromethorphan-Guaifenesin (ROBITUSSIN DM) 10-100 MG/5ML liquid Take 10 mLs by mouth every 12 (twelve) hours.    . docusate sodium (COLACE) 100 MG capsule Take 100 mg by mouth 2 (two) times daily.    Marland Kitchen donepezil (ARICEPT) 5 MG tablet Take 5 mg by mouth at bedtime.    . enoxaparin (LOVENOX) 40 MG/0.4ML injection Inject 0.4 mLs (40 mg total) into the skin daily. For 30 days post op for DVT prophylaxis 30 Syringe 0  . estradiol (VAGIFEM) 25 MCG vaginal tablet Place 25 mcg vaginally 2 (two) times a week.      . fish oil-omega-3 fatty acids 1000  MG capsule Take 2 g by mouth daily.      Marland Kitchen HYDROcodone-acetaminophen (NORCO) 5-325 MG tablet Take 1-2 tablets by mouth every 6 (six) hours as needed for moderate pain. 40 tablet 0  . iron polysaccharides (NIFEREX) 150 MG capsule Take 150 mg by mouth 2 (two) times daily.    . memantine (NAMENDA) 10 MG tablet Take 10 mg by mouth 2 (two) times daily.    . metoprolol tartrate (LOPRESSOR) 25 MG tablet Take 25 mg by mouth 2 (two) times daily. 1/2 pill  Bid      . ondansetron (ZOFRAN) 4 MG tablet Take 1 tablet (4 mg total) by mouth every 8 (eight) hours as needed for nausea or vomiting. 40 tablet 0  . polyethylene glycol (MIRALAX / GLYCOLAX) packet Take 17 g by mouth daily as needed. 14 each 0  . senna (SENOKOT) 8.6 MG TABS tablet Take 1 tablet (8.6 mg total) by mouth daily.  0  . simvastatin (ZOCOR) 40 MG tablet Take 40 mg by mouth at bedtime.      . vitamin B-6 (PYRIDOXINE) 25 MG tablet Take 25 mg by mouth daily.     No current facility-administered medications on file prior to visit.      Review of Systems  This is somewhat sketchy secondary to dementia.  In general does not complaining any fever or chills.  Skin does not complain of rashes or itching.  Head ears eyes nose mouth and throat not complaining of any sore throat or visual changes.  Rest or he is not complaining of  shortness breath or cough.  Cardiac does not clean of chest pain or significant lower extremity edema.  GI is not complaining of abdominal pain nausea vomiting diarrhea constipation.  GU she does have an indwelling Foley catheter with some history of urinary retention noted yesterday does not complain of any suprapubic pain or dysuria.  Musculoskeletal says at this point her hip pain is controlled otherwise does not complain of joint pain.  Neurologic is not complaining of dizziness headache or numbness.   psych does have history of dementia and also possible depression I note she is on Elavil she appears to be good spirits does not appear overtly anxious today.    Vitals:   09/10/16 1305  BP: 128/72  Pulse: 84  Resp: 18  Temp: 98.1 F (36.7 C)  TempSrc: Oral    Physical Exam  In general this is a pleasant elderly female in no distress lying comfortably in bed.  Her skin is warm and dry there is dressing over her left hip surgical site.  Eyes sclera and the gentle are clear visual acuity appears grossly intact.  Oropharynx is clear mucous membranes moist.  Chest is clear to auscultation there is no labored breathing.  Heart is regular rate and rhythm without murmur gallop or rub.  Abdomen is soft does not appear to be acutely tender to palpation there are positive bowel sounds.  GU could not really appreciate suprapubic tenderness there is a Foley catheter inserted draining a moderate amount of amber colored urine.  Musculoskeletal does have limited mobility of her left leg status post surgery other wise appears to move all her other extremities at baseline she does not really have significant lower extremity edema.  Pedal pulses are intact bilaterally.  Neurologic is grossly intact her speech is clear no lateralizing findings.  Psych she is oriented to self is pleasant and appropriate although somewhat confused-she is conversant   Labs reviewed:  Basic Metabolic  Panel:  Recent Labs  09/07/16 0946 09/08/16 0418 09/10/16 0700  NA 138 137 138  K 3.8 3.3* 4.0  CL 104 100* 103  CO2 25 28 28   GLUCOSE 142* 131* 125*  BUN 10 10 11   CREATININE 0.61 0.61 0.55  CALCIUM 7.9* 8.0* 8.7*   Liver Function Tests: No results for input(s): AST, ALT, ALKPHOS, BILITOT, PROT, ALBUMIN in the last 8760 hours. No results for input(s): LIPASE, AMYLASE in the last 8760 hours. No results for input(s): AMMONIA in the last 8760 hours. CBC:  Recent Labs  09/05/16 2027  09/08/16 0705 09/09/16 0224 09/10/16 0700  WBC 13.8*  < > 9.1 11.5* 9.7  NEUTROABS 11.3*  --   --   --  5.4  HGB 10.5*  < > 7.8* 10.6* 12.9  HCT 30.8*  < > 24.5* 31.3* 38.5  MCV 72.8*  < > 70.4* 74.2* 68.0*  PLT 186  < > 148* 126* 210  < > = values in this interval not displayed. Cardiac Enzymes: No results for input(s): CKTOTAL, CKMB, CKMBINDEX, TROPONINI in the last 8760 hours. BNP: Invalid input(s): POCBNP Lab Results  Component Value Date   HGBA1C  01/16/2008    5.6 (NOTE)   The ADA recommends the following therapeutic goal for glycemic   control related to Hgb A1C measurement:   Goal of Therapy:   < 7.0% Hgb A1C   Reference: American Diabetes Association: Clinical Practice   Recommendations 2008, Diabetes Care,  2008, 31:(Suppl 1).   No results found for: TSH No results found for: VITAMINB12 No results found for: FOLATE Lab Results  Component Value Date   FERRITIN 486 (H) 10/05/2009    Imaging and Procedures obtained prior to SNF admission: No results found.  Assessment/Plan  #1-history of left hip fracture with repair status post IM nailing-. Did have blood loss anemia but appears to have responded very well to transfusion and irn hemoglobin is now up-12.6 will check this  next week to make sure there is stability.  She continues on Lovenox for 4 weeks for DVT prophylaxis-is receiving when necessary hydrocodone and baclofen for pain at this point appears effective but will  have to be monitored.  #2 history of acute blood loss anemia as noted above she has responded well to transfusion and iron hemoglobin has risen from 7.8 up to 12.6 on lab done today-will update this next week.  #3 hypertension minimal readings with this appears to be stable she is on metoprolol 25 mg twice a day.  #4 dementia I would classify this as moderate she is on Aricept and Namenda were restarted apparently on discharge at this point continue supportive care.  #5 history of diabetes diet-controlled will continue blood sugars every morning-CBG so far have been 133 and 150.  #6 constipation she is on numerous agents including MiraLAX Senokot and Colace will monitor.  #7 osteoporosis she is on Fosamax as well as calcium  Number 8-- history of depression?-She is on Elavil at this point appears to be stable.  #9--history of hyperlipidemia she continues on Zocor.  #10 history of urinary retention I suspect this is postop related-urine culture is pending although it appears this will be negative since urinalysis was quite benign-in near future will attempt to a voiding trial currently has an indwelling Foley catheter had significant urinary retention yesterday before catheter was inserted.  #11 listed history of hypothyroidism-I do not see a recent TSH will update this as well later this week.  #  12 history it appears a mild hypokalemia during her hospital stay when it was 3.3 this is now normalized at 4.0 will update that later this week as well.  [id number] note greater than 45 minutes spent assessing patient-reviewing her chart-reviewing her labs-and coordinating and formulating  a plan of care for numerous diagnoses-of note  greater than 50% of time spent coordinating plan of care :

## 2016-09-11 ENCOUNTER — Other Ambulatory Visit: Payer: Self-pay | Admitting: *Deleted

## 2016-09-11 LAB — URINE CULTURE: CULTURE: NO GROWTH

## 2016-09-11 MED ORDER — HYDROCODONE-ACETAMINOPHEN 5-325 MG PO TABS: ORAL_TABLET | ORAL | 0 refills | Status: DC

## 2016-09-11 NOTE — Telephone Encounter (Signed)
[facility] #[phone] Fax: [phone]   

## 2016-09-12 ENCOUNTER — Encounter (HOSPITAL_COMMUNITY)
Admission: AD | Admit: 2016-09-12 | Discharge: 2016-09-12 | Disposition: A | Discharge status: Home / Self Care | Payer: Medicare Other | Source: Skilled Nursing Facility | Attending: Internal Medicine | Admitting: Internal Medicine

## 2016-09-12 DIAGNOSIS — I10 Essential (primary) hypertension: Secondary | ICD-10-CM | POA: Insufficient documentation

## 2016-09-12 LAB — COMPREHENSIVE METABOLIC PANEL
ALK PHOS: 50 U/L (ref 38–126)
ALT: 42 U/L (ref 14–54)
ANION GAP: 12 (ref 5–15)
AST: 48 U/L — ABNORMAL HIGH (ref 15–41)
Albumin: 3.6 g/dL (ref 3.5–5.0)
BILIRUBIN TOTAL: 1.5 mg/dL — AB (ref 0.3–1.2)
BUN: 15 mg/dL (ref 6–20)
CALCIUM: 9.1 mg/dL (ref 8.9–10.3)
CO2: 26 mmol/L (ref 22–32)
Chloride: 99 mmol/L — ABNORMAL LOW (ref 101–111)
Creatinine, Ser: 0.62 mg/dL (ref 0.44–1.00)
GFR calc non Af Amer: >60 mL/min (ref >60)
Glucose, Bld: 201 mg/dL — ABNORMAL HIGH (ref 65–99)
Potassium: 3.4 mmol/L — ABNORMAL LOW (ref 3.5–5.1)
Sodium: 137 mmol/L (ref 135–145)
TOTAL PROTEIN: 6.6 g/dL (ref 6.5–8.1)

## 2016-09-12 LAB — TSH: TSH: 0.857 u[IU]/mL (ref 0.350–4.500)

## 2016-09-13 ENCOUNTER — Encounter (HOSPITAL_COMMUNITY)
Admission: AD | Admit: 2016-09-13 | Discharge: 2016-09-13 | Disposition: A | Discharge status: Home / Self Care | Payer: Medicare Other | Source: Skilled Nursing Facility | Attending: *Deleted | Admitting: *Deleted

## 2016-09-13 LAB — COMPREHENSIVE METABOLIC PANEL
ALT: 44 U/L (ref 14–54)
ANION GAP: 9 (ref 5–15)
AST: 46 U/L — ABNORMAL HIGH (ref 15–41)
Albumin: 3.4 g/dL — ABNORMAL LOW (ref 3.5–5.0)
Alkaline Phosphatase: 49 U/L (ref 38–126)
BUN: 15 mg/dL (ref 6–20)
CHLORIDE: 99 mmol/L — AB (ref 101–111)
CO2: 29 mmol/L (ref 22–32)
CREATININE: 0.61 mg/dL (ref 0.44–1.00)
Calcium: 8.7 mg/dL — ABNORMAL LOW (ref 8.9–10.3)
GFR calc non Af Amer: >60 mL/min (ref >60)
Glucose, Bld: 126 mg/dL — ABNORMAL HIGH (ref 65–99)
POTASSIUM: 3.8 mmol/L (ref 3.5–5.1)
SODIUM: 137 mmol/L (ref 135–145)
Total Bilirubin: 1 mg/dL (ref 0.3–1.2)
Total Protein: 6.2 g/dL — ABNORMAL LOW (ref 6.5–8.1)

## 2016-09-13 LAB — BILIRUBIN, FRACTIONATED(TOT/DIR/INDIR)
BILIRUBIN INDIRECT: 0.7 mg/dL (ref 0.3–0.9)
BILIRUBIN TOTAL: 0.9 mg/dL (ref 0.3–1.2)
Bilirubin, Direct: 0.2 mg/dL (ref 0.1–0.5)

## 2016-09-16 ENCOUNTER — Encounter (HOSPITAL_COMMUNITY)
Admission: RE | Admit: 2016-09-16 | Discharge: 2016-09-16 | Disposition: A | Discharge status: Home / Self Care | Payer: Medicare Other | Source: Skilled Nursing Facility | Attending: Internal Medicine | Admitting: Internal Medicine

## 2016-09-16 LAB — CBC
HEMATOCRIT: 36.4 % (ref 36.0–46.0)
Hemoglobin: 12.1 g/dL (ref 12.0–15.0)
MCH: 25.2 pg — ABNORMAL LOW (ref 26.0–34.0)
MCHC: 33.2 g/dL (ref 30.0–36.0)
MCV: 75.8 fL — AB (ref 78.0–100.0)
PLATELETS: 438 10*3/uL — AB (ref 150–400)
RBC: 4.8 MIL/uL (ref 3.87–5.11)
RDW: 25.7 % — ABNORMAL HIGH (ref 11.5–15.5)
WBC: 9.7 10*3/uL (ref 4.0–10.5)

## 2016-09-16 LAB — BASIC METABOLIC PANEL
Anion gap: 9 (ref 5–15)
BUN: 12 mg/dL (ref 6–20)
CHLORIDE: 98 mmol/L — AB (ref 101–111)
CO2: 29 mmol/L (ref 22–32)
CREATININE: 0.61 mg/dL (ref 0.44–1.00)
Calcium: 9 mg/dL (ref 8.9–10.3)
GFR calc non Af Amer: >60 mL/min (ref >60)
Glucose, Bld: 113 mg/dL — ABNORMAL HIGH (ref 65–99)
POTASSIUM: 3.8 mmol/L (ref 3.5–5.1)
SODIUM: 136 mmol/L (ref 135–145)

## 2016-09-20 ENCOUNTER — Non-Acute Institutional Stay (SKILLED_NURSING_FACILITY): Payer: Medicare Other | Admitting: Internal Medicine

## 2016-09-20 ENCOUNTER — Encounter (HOSPITAL_COMMUNITY)
Admission: RE | Admit: 2016-09-20 | Discharge: 2016-09-20 | Disposition: A | Discharge status: Home / Self Care | Payer: Medicare Other | Source: Skilled Nursing Facility | Attending: Internal Medicine | Admitting: Internal Medicine

## 2016-09-20 ENCOUNTER — Encounter: Payer: Self-pay | Admitting: Internal Medicine

## 2016-09-20 DIAGNOSIS — D509 Iron deficiency anemia, unspecified: Secondary | ICD-10-CM | POA: Diagnosis not present

## 2016-09-20 DIAGNOSIS — D72829 Elevated white blood cell count, unspecified: Secondary | ICD-10-CM | POA: Diagnosis not present

## 2016-09-20 LAB — CBC
HCT: 33.5 % — ABNORMAL LOW (ref 36.0–46.0)
HEMOGLOBIN: 10.9 g/dL — AB (ref 12.0–15.0)
MCH: 22.6 pg — AB (ref 26.0–34.0)
MCHC: 32.5 g/dL (ref 30.0–36.0)
MCV: 69.4 fL — ABNORMAL LOW (ref 78.0–100.0)
Platelets: 545 10*3/uL — ABNORMAL HIGH (ref 150–400)
RBC: 4.83 MIL/uL (ref 3.87–5.11)
RDW: 22.4 % — ABNORMAL HIGH (ref 11.5–15.5)
WBC: 13.1 10*3/uL — ABNORMAL HIGH (ref 4.0–10.5)

## 2016-09-20 LAB — BASIC METABOLIC PANEL
ANION GAP: 9 (ref 5–15)
BUN: 14 mg/dL (ref 6–20)
CHLORIDE: 98 mmol/L — AB (ref 101–111)
CO2: 28 mmol/L (ref 22–32)
Calcium: 9.1 mg/dL (ref 8.9–10.3)
Creatinine, Ser: 0.6 mg/dL (ref 0.44–1.00)
GFR calc non Af Amer: >60 mL/min (ref >60)
Glucose, Bld: 132 mg/dL — ABNORMAL HIGH (ref 65–99)
POTASSIUM: 4.1 mmol/L (ref 3.5–5.1)
Sodium: 135 mmol/L (ref 135–145)

## 2016-09-20 LAB — DIFFERENTIAL
BASOS ABS: 0.1 10*3/uL (ref 0.0–0.1)
Basophils Relative: 0 %
Eosinophils Absolute: 0.2 10*3/uL (ref 0.0–0.7)
Eosinophils Relative: 1 %
LYMPHS ABS: 2.1 10*3/uL (ref 0.7–4.0)
LYMPHS PCT: 16 %
Monocytes Absolute: 1.5 10*3/uL — ABNORMAL HIGH (ref 0.1–1.0)
Monocytes Relative: 11 %
NEUTROS ABS: 9.4 10*3/uL — AB (ref 1.7–7.7)
NEUTROS PCT: 72 %

## 2016-09-20 NOTE — Progress Notes (Signed)
Location:   [facility] Room Number: 154/W Place of Service:  SNF [phone]) Provider:  Amado Coe., MD  Patient Care Team: Redmond School, MD as PCP - General (Internal Medicine) Daneil Dolin, MD (Gastroenterology)  Extended Emergency Contact Information Primary Emergency Contact: [facility] Address: [address]          [place], [place] [address] Johnnette Litter of [place] Phone: [phone] Mobile Phone: [phone] Relation: Daughter Secondary Emergency Contact: Givans,Arnie  [place] of [place] Phone: [phone] Relation: Son  Code Status:  DNR Goals of care: Advanced Directive information Advanced Directives 09/20/2016  Does Patient Have a Medical Advance Directive? Yes  Type of Advance Directive Out of facility DNR (pink MOST or yellow form)  Does patient want to make changes to medical advance directive? No - Patient declined     Chief Complaint  Patient presents with  . Acute Visit    Elevated WBC    HPI:  Pt is a 83 y.o. female seen today for an acute visit forLeukocytosis.   Routine lab done today shows an elevated count of 13,100-she is here for rehabilitation after sustaining a left hip fracture after a fall.  She did have an IM nailing in late February postoperative course was unremarkable except for blood loss anemia she did receive a transfusion and she is on iron and did rise to 12.6 on lab done today this 10.9 per chart review this appears relatively stable with what her hemoglobin was before the surgery.  Currently she is resting in bed comfortably has no acute complaints nursing staff says she is going well and she feels that way as well she feels she is getting stronger denies any fever chills shortness of breath crease coughing or burning with urination.  We did do a urine culture on her approximate 10 days ago that she had some dysuria but this did not show any significant growth.  She  also denies diarrhea nursing staff does not report any diarrhea either area  In regards to her surgical wound this is currently covered on the left hip per nursing this appears to be unremarkable without drainage bleeding or sign of infection palpation of the area did not really elicit any significant firmness   oracute tenderness   Past Medical History:  Diagnosis Date  . Atrial fibrillation ([place])   . Dementia   . Diabetes mellitus   . Diverticulitis 8/09, 3/10   abscess in 2009(medical tx)  . HTN (hypertension)   . Hypercholesteremia   . Hyperthyroidism    s/p radiation, surgery  . IDA (iron deficiency anemia)   . Osteoporosis   . Postoperative anemia due to acute blood loss 09/08/2016  . Thalassemia   . Tubular adenoma of colon 12/15/08   Colonosocpy Dr Tonita Cong removed cecum   Past Surgical History:  Procedure Laterality Date  . CESAREAN SECTION     x2  . CHOLECYSTECTOMY    . FOOT SURGERY     bilat  . INTRAMEDULLARY (IM) NAIL INTERTROCHANTERIC Left 09/06/2016   Procedure: LEFT HIP INTRAMEDULLARY (IM) NAIL;  Surgeon: Renette Butters, MD;  Location: [facility];  Service: Orthopedics;  Laterality: Left;  . THYROIDECTOMY, PARTIAL      Allergies  Allergen Reactions  . Procaine Hcl Other (See Comments)    Numbness/jerkiness  . Sulfonamide Derivatives Other (See Comments)    hypertension  . Penicillins Rash    Has patient had a PCN reaction causing immediate rash,  facial/tongue/throat swelling, SOB or lightheadedness with hypotension:YES Has patient had a PCN reaction causing severe rash involving mucus membranes or skin necrosis:NO Has patient had a PCN reaction that required hospitalizationNO Has patient had a PCN reaction occurring within the last 10 years: NO If all of the above answers are "NO", then may proceed with Cephalosporin use.    Current Outpatient Prescriptions on File Prior to Visit  Medication Sig Dispense Refill  . alendronate (FOSAMAX) 70 MG tablet Take 70  mg by mouth once a week. Take with a full glass of water on an empty stomach. Take on Wednesdays    . amitriptyline (ELAVIL) 10 MG tablet Take 10 mg by mouth at bedtime.      . baclofen (LIORESAL) 10 MG tablet Take 1 tablet (10 mg total) by mouth 3 (three) times daily as needed for muscle spasms. 40 each 0  . Calcium Carbonate-Vitamin D (CALCIUM + D PO) Take by mouth 2 (two) times daily.      . Coenzyme Q10 (CO Q-10) 100 MG CAPS Take by mouth.      . Cranberry 425 MG CAPS Take 1 capsule by mouth 2 (two) times daily.    Marland Kitchen Dextromethorphan-Guaifenesin (ROBITUSSIN DM) 10-100 MG/5ML liquid Take 10 mLs by mouth every 12 (twelve) hours.    . docusate sodium (COLACE) 100 MG capsule Take 100 mg by mouth 2 (two) times daily.    Marland Kitchen donepezil (ARICEPT) 5 MG tablet Take 5 mg by mouth at bedtime.    . enoxaparin (LOVENOX) 40 MG/0.4ML injection Inject 0.4 mLs (40 mg total) into the skin daily. For 30 days post op for DVT prophylaxis 30 Syringe 0  . estradiol (VAGIFEM) 25 MCG vaginal tablet Place 25 mcg vaginally 2 (two) times a week.      . fish oil-omega-3 fatty acids 1000 MG capsule Take 2 g by mouth daily.      . Glucosamine-Chondroit-Vit C-Mn (SM GLUCOSAMINE-CHONDROITIN) 750-600 MG TABS Take 1 tablet by mouth once a day    . iron polysaccharides (NIFEREX) 150 MG capsule Take 150 mg by mouth 2 (two) times daily.    . memantine (NAMENDA) 10 MG tablet Take 10 mg by mouth 2 (two) times daily.    . metoprolol tartrate (LOPRESSOR) 25 MG tablet Take 25 mg by mouth 2 (two) times daily. 1/2 pill  Bid      . ondansetron (ZOFRAN) 4 MG tablet Take 1 tablet (4 mg total) by mouth every 8 (eight) hours as needed for nausea or vomiting. 40 tablet 0  . polyethylene glycol (MIRALAX / GLYCOLAX) packet Take 17 g by mouth daily as needed. 14 each 0  . senna (SENOKOT) 8.6 MG TABS tablet Take 1 tablet (8.6 mg total) by mouth daily.  0  . simvastatin (ZOCOR) 40 MG tablet Take 40 mg by mouth at bedtime.      . vitamin B-6  (PYRIDOXINE) 25 MG tablet Take 25 mg by mouth daily.     No current facility-administered medications on file prior to visit.      Review of Systems   In general does not complaining any fever or chills. Says she feels stronger feels she is doing quite well  Skin does not complain of rashes or itching--surgical wound per nursing is benign.  Head ears eyes nose mouth and throat not complaining of any sore throat or visual changes.  Resp-- is not complaining of shortness breath or cough.  Cardiac does not complain  of chest pain or significant  lower extremity edema.  GI is not complaining of abdominal pain nausea vomiting diarrhea constipation.  GU did have some history of urinary retention at one point had a Foley catheter this is since been removed and she apparently is voiding well  Musculoskeletal says at this point her hip pain is controlled otherwise does not complain of joint pain.  Neurologic is not complaining of dizziness headache or numbness.   psych does have history of dementia and also possible depression I note she is on Elavil she appears to be good spirits does not appear overtly anxious today she is bright alert actually visiting with friends.   There is no immunization history on file for this patient. Pertinent  Health Maintenance Due  Topic Date Due  . FOOT EXAM  08/09/1938  . OPHTHALMOLOGY EXAM  08/09/1938  . URINE MICROALBUMIN  08/09/1938  . HEMOGLOBIN A1C  07/18/2008  . INFLUENZA VACCINE  06/14/2017 (Originally 02/13/2016)  . PNA vac Low Risk Adult (1 of 2 - PCV13) 06/14/2017 (Originally 08/10/1995)  . DEXA SCAN  Completed   No flowsheet data found. Functional Status Survey:    Vitals:   09/20/16 1208  BP: 126/68  Pulse: 70  Resp: 18  Temp: 98.2 F (36.8 C)  TempSrc: Oral    Physical Exam   In general this is a pleasant elderly female in no distress lying comfortably in bed.  Her skin is warm and dry there is dressing over her  left hip surgical site--no tenderness no apparent drainage per nursing staff there is no sign of infection appears to be healing unremarkably.  Eyes sclera and the gentle are clear visual acuity appears grossly intact.  Oropharynx is clear mucous membranes moist.  Chest is clear to auscultation there is no labored breathing.  Heart is regular rate and rhythm without murmur gallop or rub.  Abdomen is soft does not appear to be tender to palpation there are positive bowel sounds  GU could not really appreciate any suprapubic distention or tenderness to palpation  Musculoskeletal does have limited mobility of her left leg status post surgery other wise appears to move all her other extremities at baseline she does not really have significant lower extremity edema. Pedal pulses are intact bilaterally  Pedal pulses are intact bilaterally.  Neurologic is grossly intact her speech is clear no lateralizing findings.  Psych she is oriented to self is pleasant and appropriate although somewhat confused-she is conversant and  very pleasant  Labs reviewed:  Recent Labs  09/13/16 0700 09/16/16 0700 09/20/16 0700  NA 137 136 135  K 3.8 3.8 4.1  CL 99* 98* 98*  CO2 29 29 28   GLUCOSE 126* 113* 132*  BUN 15 12 14   CREATININE 0.61 0.61 0.60  CALCIUM 8.7* 9.0 9.1    Recent Labs  09/12/16 0700 09/13/16 0700  AST 48* 46*  ALT 42 44  ALKPHOS 50 49  BILITOT 1.5* 0.9  1.0  PROT 6.6 6.2*  ALBUMIN 3.6 3.4*    Recent Labs  09/05/16 2027  09/10/16 0700 09/16/16 0700 09/20/16 0700  WBC 13.8*  < > 9.7 9.7 13.1*  NEUTROABS 11.3*  --  5.4  --  9.4*  HGB 10.5*  < > 12.9 12.1 10.9*  HCT 30.8*  < > 38.5 36.4 33.5*  MCV 72.8*  < > 68.0* 75.8* 69.4*  PLT 186  < > 210 438* 545*  < > = values in this interval not displayed. Lab Results  Component Value Date  TSH 0.857 09/12/2016   Lab Results  Component Value Date   HGBA1C  01/16/2008    5.6 (NOTE)   The ADA recommends  the following therapeutic goal for glycemic   control related to Hgb A1C measurement:   Goal of Therapy:   < 7.0% Hgb A1C   Reference: American Diabetes Association: Clinical Practice   Recommendations 2008, Diabetes Care,  2008, 31:(Suppl 1).   No results found for: CHOL, HDL, LDLCALC, LDLDIRECT, TRIG, CHOLHDL  Significant Diagnostic Results in last 30 days:  Dg Chest 1 View  Result Date: 09/05/2016 CLINICAL DATA:  Preoperative chest radiograph. Left hip deformity. Initial encounter. EXAM: CHEST 1 VIEW COMPARISON:  Chest radiograph performed 12/19/2009 FINDINGS: The lungs are well-aerated. Peribronchial thickening is noted. A calcified granuloma is noted at the left lung base. There is no evidence of focal opacification, pleural effusion or pneumothorax. The cardiomediastinal silhouette is mildly enlarged. No acute osseous abnormalities are seen. IMPRESSION: Peribronchial thickening noted. Mild cardiomegaly. No displaced rib fracture seen. Electronically Signed   By: Garald Balding M.D.   On: 09/05/2016 20:20   Ct Head Wo Contrast  Result Date: 09/05/2016 CLINICAL DATA:  Patient fell on the way of the bathroom. EXAM: CT HEAD WITHOUT CONTRAST TECHNIQUE: Contiguous axial images were obtained from the base of the skull through the vertex without intravenous contrast. COMPARISON:  12/19/2009 FINDINGS: Brain: There is no evidence for acute hemorrhage, hydrocephalus, mass lesion, or abnormal extra-axial fluid collection. No definite CT evidence for acute infarction. Diffuse loss of parenchymal volume is consistent with atrophy. Patchy low attenuation in the deep hemispheric and periventricular white matter is nonspecific, but likely reflects chronic microvascular ischemic demyelination. Vascular: Atherosclerotic calcification is visualized in the carotid arteries. No dense MCA sign. Major dural sinuses are unremarkable. Skull: No evidence for fracture. No worrisome lytic or sclerotic lesion. Sinuses/Orbits:  The visualized paranasal sinuses and mastoid air cells are clear. Visualized portions of the globes and intraorbital fat are unremarkable. Other: None. IMPRESSION: 1. Stable exam.  No acute intracranial abnormality. 2. Atrophy with chronic small vessel white matter ischemic disease. Electronically Signed   By: Misty Stanley M.D.   On: 09/05/2016 20:32   Dg C-arm 1-60 Min  Result Date: 09/06/2016 CLINICAL DATA:  ORIF left femur fracture. EXAM: LEFT FEMUR 2 VIEWS; DG C-ARM 61-120 MIN COMPARISON:  Radiography yesterday. FINDINGS: Multiple C-arm images show gamma nail placement for treatment of comminuted intertrochanteric and subtrochanteric fracture of the left femur. Components are well-positioned. Alignment is near anatomic. Distal locking screw. IMPRESSION: Good appearance following ORIF left femur fracture. Electronically Signed   By: Nelson Chimes M.D.   On: 09/06/2016 16:24   Dg Hip Port Unilat With Pelvis 1v Left  Result Date: 09/06/2016 CLINICAL DATA:  Post left intramedullary nail. EXAM: DG HIP (WITH OR WITHOUT PELVIS) 1V PORT LEFT COMPARISON:  09/05/2016 FINDINGS: Interval fixation of patient's left trochanteric/subtrochanteric fracture with left femoral intramedullary nail and associated screw bridging the femoral neck into the femoral head as hardware is intact. There is anatomic alignment about the fracture site. Remainder of the exam is unchanged. IMPRESSION: Fixation of patient's left trochanteric/ subtrochanteric fracture with hardware intact. Electronically Signed   By: Marin Olp M.D.   On: 09/06/2016 17:16   Dg Hip Unilat With Pelvis 2-3 Views Left  Result Date: 09/05/2016 CLINICAL DATA:  Left hip deformity after fall. EXAM: DG HIP (WITH OR WITHOUT PELVIS) 2-3V LEFT; LEFT FEMUR 2 VIEWS COMPARISON:  None. FINDINGS: Left hip: Intertrochanteric left proximal  femur fracture with moderate coxa vara deformity. No left hip dislocation. Lucency in the lateral aspect of the left symphysis pubis  may represent a nondisplaced fracture. Advanced degenerative changes of the lower lumbar spine. Left femur: Intertrochanteric left proximal femur fracture with moderate coxa vara deformity. The left knee joint is maintained. No additional femoral fracture. IMPRESSION: Intertrochanteric left proximal femur fracture with moderate coxa vara deformity. No left hip dislocation. Probable nondisplaced fracture of left pubis symphysis. Electronically Signed   By: Kristine Garbe M.D.   On: 09/05/2016 20:23   Dg Femur Min 2 Views Left  Result Date: 09/06/2016 CLINICAL DATA:  ORIF left femur fracture. EXAM: LEFT FEMUR 2 VIEWS; DG C-ARM 61-120 MIN COMPARISON:  Radiography yesterday. FINDINGS: Multiple C-arm images show gamma nail placement for treatment of comminuted intertrochanteric and subtrochanteric fracture of the left femur. Components are well-positioned. Alignment is near anatomic. Distal locking screw. IMPRESSION: Good appearance following ORIF left femur fracture. Electronically Signed   By: Nelson Chimes M.D.   On: 09/06/2016 16:24   Dg Femur Min 2 Views Left  Result Date: 09/05/2016 CLINICAL DATA:  Left hip deformity after fall. EXAM: DG HIP (WITH OR WITHOUT PELVIS) 2-3V LEFT; LEFT FEMUR 2 VIEWS COMPARISON:  None. FINDINGS: Left hip: Intertrochanteric left proximal femur fracture with moderate coxa vara deformity. No left hip dislocation. Lucency in the lateral aspect of the left symphysis pubis may represent a nondisplaced fracture. Advanced degenerative changes of the lower lumbar spine. Left femur: Intertrochanteric left proximal femur fracture with moderate coxa vara deformity. The left knee joint is maintained. No additional femoral fracture. IMPRESSION: Intertrochanteric left proximal femur fracture with moderate coxa vara deformity. No left hip dislocation. Probable nondisplaced fracture of left pubis symphysis. Electronically Signed   By: Kristine Garbe M.D.   On: 09/05/2016 20:23     Assessment/Plan 1 leukocytosis unclear etiology-I have ordered a differential to differentiate where this white count is coming from.  She is not showing really any signs of infection here whether cellulitic-respiratory or from the urinary tract-she denies dysuria burning with urination any shortness of breath or increased cough or chest congestion-there's been no documented diarrhea.  At this point will monitor vital signs every shift notify provider of any increased temperature-also will update a stat CBC with differential tomorrow to see if possibly this may be a lab variation.  #2 history of anemia this was thought to be postop she did receive a transfusion hemoglobin did rise over 12 it is now 10.9 which per chart review appears to be stable with what her hemoglobin was prior to the operation-at this point will monitor she is on iron.  She says clinically she is feeling stronger  Addendum we have received the differential results neutrophils are slightly elevated at 9.4-monocytes are also mildly elevated at 1.5-.  [id number] of note greater than 25 minutes spent assessing patient-reviewing her chart-reviewing her labs-discussing her status with nursing staff-in coordinating and formulating a plan of care

## 2016-09-21 ENCOUNTER — Encounter (HOSPITAL_COMMUNITY)
Admission: RE | Admit: 2016-09-21 | Discharge: 2016-09-21 | Disposition: A | Discharge status: Home / Self Care | Payer: Medicare Other | Source: Skilled Nursing Facility | Attending: Internal Medicine | Admitting: Internal Medicine

## 2016-09-21 ENCOUNTER — Non-Acute Institutional Stay (SKILLED_NURSING_FACILITY): Payer: Medicare Other | Admitting: Internal Medicine

## 2016-09-21 DIAGNOSIS — I10 Essential (primary) hypertension: Secondary | ICD-10-CM | POA: Diagnosis not present

## 2016-09-21 DIAGNOSIS — E089 Diabetes mellitus due to underlying condition without complications: Secondary | ICD-10-CM

## 2016-09-21 DIAGNOSIS — D500 Iron deficiency anemia secondary to blood loss (chronic): Secondary | ICD-10-CM

## 2016-09-21 DIAGNOSIS — S72002D Fracture of unspecified part of neck of left femur, subsequent encounter for closed fracture with routine healing: Secondary | ICD-10-CM | POA: Diagnosis not present

## 2016-09-21 DIAGNOSIS — D649 Anemia, unspecified: Secondary | ICD-10-CM | POA: Insufficient documentation

## 2016-09-21 LAB — CBC WITH DIFFERENTIAL/PLATELET
BASOS ABS: 0 10*3/uL (ref 0.0–0.1)
Basophils Relative: 0 %
Eosinophils Absolute: 0.2 10*3/uL (ref 0.0–0.7)
Eosinophils Relative: 2 %
HEMATOCRIT: 33.1 % — AB (ref 36.0–46.0)
Hemoglobin: 10.7 g/dL — ABNORMAL LOW (ref 12.0–15.0)
LYMPHS ABS: 2.1 10*3/uL (ref 0.7–4.0)
Lymphocytes Relative: 21 %
MCH: 23.8 pg — AB (ref 26.0–34.0)
MCHC: 32.3 g/dL (ref 30.0–36.0)
MCV: 73.6 fL — AB (ref 78.0–100.0)
Monocytes Absolute: 1.1 10*3/uL — ABNORMAL HIGH (ref 0.1–1.0)
Monocytes Relative: 11 %
NEUTROS ABS: 6.5 10*3/uL (ref 1.7–7.7)
Neutrophils Relative %: 66 %
Platelets: 497 10*3/uL — ABNORMAL HIGH (ref 150–400)
RBC: 4.5 MIL/uL (ref 3.87–5.11)
RDW: 24.3 % — ABNORMAL HIGH (ref 11.5–15.5)
WBC: 9.8 10*3/uL (ref 4.0–10.5)

## 2016-09-21 NOTE — Progress Notes (Signed)
09/21/16  Facility [facility]  Chief complaint; review status post admission to [facility] 09/05/16 through 09/09/16  History; this is a 83 year old woman [place]. She suffered a left hip fracture. She will underwent an intramedullary nail on 2/23. She had postoperative anemia. She is listed as having dementia there does not seem to be any other major medical issues at the moment. She resides at [facility] assisted living and tells me that she does not have a history of falling she does not use an ambulatory assist device according to her. She is on Lovenox for DVT prophylaxis  CBC Latest Ref Rng & Units 09/21/2016 09/20/2016 09/16/2016  WBC 4.0 - 10.5 K/uL 9.8 13.1(H) 9.7  Hemoglobin 12.0 - 15.0 g/dL 10.7(L) 10.9(L) 12.1  Hematocrit 36.0 - 46.0 % 33.1(L) 33.5(L) 36.4  Platelets 150 - 400 K/uL 497(H) 545(H) 438(H)    Past Medical History:  Diagnosis Date  . Atrial fibrillation ([place])   . Dementia   . Diabetes mellitus   . Diverticulitis 8/09, 3/10   abscess in 2009(medical tx)  . HTN (hypertension)   . Hypercholesteremia   . Hyperthyroidism    s/p radiation, surgery  . IDA (iron deficiency anemia)   . Osteoporosis   . Postoperative anemia due to acute blood loss 09/08/2016  . Thalassemia   . Tubular adenoma of colon 12/15/08   Colonosocpy Dr Tonita Cong removed cecum    Past Surgical History:  Procedure Laterality Date  . CESAREAN SECTION     x2  . CHOLECYSTECTOMY    . FOOT SURGERY     bilat  . INTRAMEDULLARY (IM) NAIL INTERTROCHANTERIC Left 09/06/2016   Procedure: LEFT HIP INTRAMEDULLARY (IM) NAIL;  Surgeon: Renette Butters, MD;  Location: [facility];  Service: Orthopedics;  Laterality: Left;  . THYROIDECTOMY, PARTIAL      Current Outpatient Prescriptions on File Prior to Visit  Medication Sig Dispense Refill  . alendronate (FOSAMAX) 70 MG tablet Take 70 mg by mouth once a week. Take with a full glass of water on an empty stomach. Take on  Wednesdays    . amitriptyline (ELAVIL) 10 MG tablet Take 10 mg by mouth at bedtime.      . baclofen (LIORESAL) 10 MG tablet Take 1 tablet (10 mg total) by mouth 3 (three) times daily as needed for muscle spasms. 40 each 0  . Calcium Carbonate-Vitamin D (CALCIUM + D PO) Take by mouth 2 (two) times daily.      . Coenzyme Q10 (CO Q-10) 100 MG CAPS Take by mouth.      . Cranberry 425 MG CAPS Take 1 capsule by mouth 2 (two) times daily.    . cycloSPORINE (RESTASIS) 0.05 % ophthalmic emulsion Place 1 drop into both eyes 2 (two) times daily.    Marland Kitchen Dextromethorphan-Guaifenesin (ROBITUSSIN DM) 10-100 MG/5ML liquid Take 10 mLs by mouth every 12 (twelve) hours.    . docusate sodium (COLACE) 100 MG capsule Take 100 mg by mouth 2 (two) times daily.    Marland Kitchen donepezil (ARICEPT) 5 MG tablet Take 5 mg by mouth at bedtime.    . enoxaparin (LOVENOX) 40 MG/0.4ML injection Inject 0.4 mLs (40 mg total) into the skin daily. For 30 days post op for DVT prophylaxis 30 Syringe 0  . estradiol (VAGIFEM) 25 MCG vaginal tablet Place 25 mcg vaginally 2 (two) times a week.      . fish oil-omega-3 fatty acids 1000 MG capsule Take 2  g by mouth daily.      . Glucosamine-Chondroit-Vit C-Mn (SM GLUCOSAMINE-CHONDROITIN) 750-600 MG TABS Take 1 tablet by mouth once a day    . HYDROcodone-acetaminophen (NORCO/VICODIN) 5-325 MG tablet Take 1 tablet by mouth every 4 (four) hours as needed for moderate pain.    Marland Kitchen HYDROcodone-acetaminophen (NORCO/VICODIN) 5-325 MG tablet Take 2 tablets by mouth every 4 (four) hours as needed for moderate pain.    . iron polysaccharides (NIFEREX) 150 MG capsule Take 150 mg by mouth 2 (two) times daily.    . memantine (NAMENDA) 10 MG tablet Take 10 mg by mouth 2 (two) times daily.    . metoprolol tartrate (LOPRESSOR) 25 MG tablet Take 25 mg by mouth 2 (two) times daily. 1/2 pill  Bid      . ondansetron (ZOFRAN) 4 MG tablet Take 1 tablet (4 mg total) by mouth every 8 (eight) hours as needed for nausea or vomiting.  40 tablet 0  . polyethylene glycol (MIRALAX / GLYCOLAX) packet Take 17 g by mouth daily as needed. 14 each 0  . senna (SENOKOT) 8.6 MG TABS tablet Take 1 tablet (8.6 mg total) by mouth daily.  0  . simvastatin (ZOCOR) 40 MG tablet Take 40 mg by mouth at bedtime.      . vitamin B-6 (PYRIDOXINE) 25 MG tablet Take 25 mg by mouth daily.       reports that she has never smoked. She has never used smokeless tobacco. She reports that she drinks alcohol. She reports that she does not use drugs. As noted the patient states she lives at [facility] and does not use an ambulatory assist device.  family history includes Colon cancer (age of onset: 60) in her mother; Coronary artery disease in her mother; Pneumonia in her father; Stroke in her mother; Thalassemia in her sister.   Review of systems; Gen; no weight loss Respiratory; no shortness of breath, no cough, Cardiac; no exertional chest pain, no palpitations GI; no abnormal pain, no change in bowel habits GU; no dysuria, no voiding difficulties Musculoskeletal; no joint pain Psychiatric; "I'm in an assisted living because of my memory"  Physical examination Gen. patient is not in any distress Vitals O2 sat 96% on room air pulse rate 69 respirations 18 and unlabored Respiratory clear entry bilaterally no wheezing Cardiac heart sounds are normal. Soft monitor 6 midsystolic murmur which sounds benign. She appears to be euvolemic Abdomen no liver no spleen no tenderness GU no suprapubic or costovertebral angle tenderness Extremities; no edema no evidence of a DVT. Left hip incision looks clean Neurologic; able to move all her limbs reflexes symmetric.  Impression/plan #1 status post left hip intramedullary nail. She appears to be doing well. #2 postoperative anemia has stabilized hemoglobin is stabilized #3 essential hypertension on a beta blocker #4 mild dementia on Namenda and Aricept. #5 osteoporosis on Fosamax #6 type 2 diabetes not  currently on any treatment I don't see a hemoglobin A1c

## 2016-10-01 ENCOUNTER — Other Ambulatory Visit: Payer: Self-pay

## 2016-10-01 MED ORDER — HYDROCODONE-ACETAMINOPHEN 5-325 MG PO TABS: 1.0000 | ORAL_TABLET | ORAL | 0 refills | Status: DC | PRN

## 2016-10-01 NOTE — Telephone Encounter (Signed)
Rx faxed to [facility] at [phone].   [address] [place], [place] [address]  Phone #: [phone] 

## 2016-10-09 ENCOUNTER — Encounter: Payer: Self-pay | Admitting: Internal Medicine

## 2016-10-09 ENCOUNTER — Non-Acute Institutional Stay (SKILLED_NURSING_FACILITY): Payer: Medicare Other | Admitting: Internal Medicine

## 2016-10-09 DIAGNOSIS — E089 Diabetes mellitus due to underlying condition without complications: Secondary | ICD-10-CM | POA: Diagnosis not present

## 2016-10-09 DIAGNOSIS — D509 Iron deficiency anemia, unspecified: Secondary | ICD-10-CM | POA: Diagnosis not present

## 2016-10-09 DIAGNOSIS — Z8781 Personal history of (healed) traumatic fracture: Secondary | ICD-10-CM | POA: Diagnosis not present

## 2016-10-09 DIAGNOSIS — M810 Age-related osteoporosis without current pathological fracture: Secondary | ICD-10-CM | POA: Diagnosis not present

## 2016-10-09 DIAGNOSIS — I10 Essential (primary) hypertension: Secondary | ICD-10-CM

## 2016-10-09 NOTE — Progress Notes (Signed)
Location:   [facility] Room Number: 154/W Place of Service:  SNF (31)  Provider: Granville Lewis  PCP: Glo Herring, MD Patient Care Team: Redmond School, MD as PCP - General (Internal Medicine) Daneil Dolin, MD (Gastroenterology)  Extended Emergency Contact Information Primary Emergency Contact: [facility] Address: [address]          [place],  [address] Johnnette Litter of [place] Phone: [phone] Mobile Phone: [phone] Relation: Daughter Secondary Emergency Contact: Salinas,Arnie  [place] of [place] Phone: [phone] Relation: Son  Code Status: DNR Goals of care:  Advanced Directive information Advanced Directives 10/09/2016  Does Patient Have a Medical Advance Directive? Yes  Type of Advance Directive Out of facility DNR (pink MOST or yellow form)  Does patient want to make changes to medical advance directive? No - Patient declined     Allergies  Allergen Reactions  . Procaine Hcl Other (See Comments)    Numbness/jerkiness  . Sulfonamide Derivatives Other (See Comments)    hypertension  . Penicillins Rash    Has patient had a PCN reaction causing immediate rash, facial/tongue/throat swelling, SOB or lightheadedness with hypotension:YES Has patient had a PCN reaction causing severe rash involving mucus membranes or skin necrosis:NO Has patient had a PCN reaction that required hospitalizationNO Has patient had a PCN reaction occurring within the last 10 years: NO If all of the above answers are "NO", then may proceed with Cephalosporin use.    Chief Complaint  Patient presents with  . Discharge Note    HPI:  83 y.o. female  seen today for discharge later this week.  She was here for rehabilitation after sustaining a left hip fracture after fall this was surgically repaired with an IM nailing.  She had postop anemia but this appears to have stabilized with a hemoglobin of 10.7 earlier this month we'll  update this before discharge.  She continues on iron.  She also had some intermittent urinary retention upon arrival but this appears to have resolved unremarkably.  She does have a history of dementia this appears to be mild-to-moderate she is on Aricept and Namenda appears to be doing well.  She will be going back to her assisted living facility.  Her vital signs appear to be stable she does have hypertension history this is stable on Lopressor recent blood pressures 118/74-122/74 pulses have been recently in the 70s.  She also has a history of diabetes type 2 this apparently is diet controlled we did do CBGs upon arrival and these were satisfactory largely in the lower to mid 100s.  Currently she has no complaints she is weightbearing as tolerated.          Past Medical History:  Diagnosis Date  . Atrial fibrillation ([place])   . Dementia   . Diabetes mellitus   . Diverticulitis 8/09, 3/10   abscess in 2009(medical tx)  . HTN (hypertension)   . Hypercholesteremia   . Hyperthyroidism    s/p radiation, surgery  . IDA (iron deficiency anemia)   . Osteoporosis   . Postoperative anemia due to acute blood loss 09/08/2016  . Thalassemia   . Tubular adenoma of colon 12/15/08   Colonosocpy Dr Tonita Cong removed cecum    Past Surgical History:  Procedure Laterality Date  . CESAREAN SECTION     x2  . CHOLECYSTECTOMY    . FOOT SURGERY     bilat  . INTRAMEDULLARY (IM) NAIL INTERTROCHANTERIC Left 09/06/2016   Procedure: LEFT HIP  INTRAMEDULLARY (IM) NAIL;  Surgeon: Renette Butters, MD;  Location: [facility];  Service: Orthopedics;  Laterality: Left;  . THYROIDECTOMY, PARTIAL        reports that she has never smoked. She has never used smokeless tobacco. She reports that she drinks alcohol. She reports that she does not use drugs. Social History   Social History  . Marital status: Widowed    Spouse name: N/A  . Number of children: 4  . Years of education: 10th grade    Occupational History  . 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 Smoking status: Never Smoker  . Smokeless tobacco: Never Used  . Alcohol use Yes     Comment: occasionally   . Drug use: No  . Sexual activity: Not on file   Other Topics Concern  . Not on file   Social History Narrative   Lost 1 son-mental illness, htn age 54   Functional Status Survey:    Allergies  Allergen Reactions  . Procaine Hcl Other (See Comments)    Numbness/jerkiness  . Sulfonamide Derivatives Other (See Comments)    hypertension  . Penicillins Rash    Has patient had a PCN reaction causing immediate rash, facial/tongue/throat swelling, SOB or lightheadedness with hypotension:YES Has patient had a PCN reaction causing severe rash involving mucus membranes or skin necrosis:NO Has patient had a PCN reaction that required hospitalizationNO Has patient had a PCN reaction occurring within the last 10 years: NO If all of the above answers are "NO", then may proceed with Cephalosporin use.    Pertinent  Health Maintenance Due  Topic Date Due  . FOOT EXAM  08/09/1938  . OPHTHALMOLOGY EXAM  08/09/1938  . URINE MICROALBUMIN  08/09/1938  . HEMOGLOBIN A1C  07/18/2008  . INFLUENZA VACCINE  06/14/2017 (Originally 02/13/2016)  . PNA vac Low Risk Adult (1 of 2 - PCV13) 06/14/2017 (Originally 08/10/1995)  . DEXA SCAN  Completed    Medications: Current Outpatient Prescriptions on File Prior to Visit  Medication Sig Dispense Refill  . alendronate (FOSAMAX) 70 MG tablet Take 70 mg by mouth once a week. Take with a full glass of water on an empty stomach. Take on Wednesdays    . amitriptyline (ELAVIL) 10 MG tablet Take 10 mg by mouth at bedtime.      . baclofen (LIORESAL) 10 MG tablet Take 1 tablet (10 mg total) by mouth 3 (three) times daily as needed for muscle spasms. 40 each 0  . Calcium Carbonate-Vitamin D (CALCIUM + D PO) Take by mouth 2 (two) times daily.      . Coenzyme Q10 (CO Q-10) 100 MG  CAPS Take by mouth.      . cycloSPORINE (RESTASIS) 0.05 % ophthalmic emulsion Place 1 drop into both eyes 2 (two) times daily.    Marland Kitchen Dextromethorphan-Guaifenesin (ROBITUSSIN DM) 10-100 MG/5ML liquid Take 10 mLs by mouth every 12 (twelve) hours.    . docusate sodium (COLACE) 100 MG capsule Take 100 mg by mouth 2 (two) times daily.    Marland Kitchen donepezil (ARICEPT) 5 MG tablet Take 5 mg by mouth at bedtime.    Marland Kitchen estradiol (VAGIFEM) 25 MCG vaginal tablet Place 25 mcg vaginally 2 (two) times a week.      . fish oil-omega-3 fatty acids 1000 MG capsule Take 2 g by mouth daily.      . Glucosamine-Chondroit-Vit C-Mn (SM GLUCOSAMINE-CHONDROITIN) 750-600 MG TABS Take 1 tablet by mouth once a day    .  HYDROcodone-acetaminophen (NORCO/VICODIN) 5-325 MG tablet Take 1-2 tablets by mouth every 4 (four) hours as needed for moderate pain. 480 tablet 0  . iron polysaccharides (NIFEREX) 150 MG capsule Take 150 mg by mouth 2 (two) times daily.    . memantine (NAMENDA) 10 MG tablet Take 10 mg by mouth 2 (two) times daily.    . metoprolol tartrate (LOPRESSOR) 25 MG tablet Take 25 mg by mouth 2 (two) times daily.     . ondansetron (ZOFRAN) 4 MG tablet Take 1 tablet (4 mg total) by mouth every 8 (eight) hours as needed for nausea or vomiting. 40 tablet 0  . polyethylene glycol (MIRALAX / GLYCOLAX) packet Take 17 g by mouth daily as needed. 14 each 0  . senna (SENOKOT) 8.6 MG TABS tablet Take 1 tablet (8.6 mg total) by mouth daily.  0  . simvastatin (ZOCOR) 40 MG tablet Take 40 mg by mouth at bedtime.      . vitamin B-6 (PYRIDOXINE) 25 MG tablet Take 25 mg by mouth daily.    Marland Kitchen enoxaparin (LOVENOX) 40 MG/0.4ML injection Inject 0.4 mLs (40 mg total) into the skin daily. For 30 days post op for DVT prophylaxis 30 Syringe 0   No current facility-administered medications on file prior to visit.      Review of Systems  This is somewhat sketchy secondary to dementia.  In general does not complaining any fever or chills.  Skin  does not complain of rashes or itching.  Head ears eyes nose mouth and throat not complaining of any sore throat or visual changes.  Resp-- is not complaining of shortness breath or cough.  Cardiac does not clean of chest pain or significant lower extremity edema.  GI is not complaining of abdominal pain nausea vomiting diarrhea constipation.  GU  Does not complain of any dysuria.  Musculoskeletal-- does not complain of joint pain.  Neurologic is not complaining of dizziness headache or numbness.   psych does have history of dementia and also possible depression I note she is on Elavil she appears to be good spirits does not appear overtly anxious today. She is looking forward to getting back home to her assisted living facility   Temperature is 97.8 pulse 76 respirations 18 and recent blood pressures 118/74-122/74-126/78 weight is 131.9 Body mass index is 24.52 kg/m. Physical Exam In general this is a pleasant elderly female in no distress l sitting comfortably in her wheelchair  Her skin is warm and dry   Eyes sclera and the gentle are clear visual acuity appears grossly intact.  Oropharynx is clear mucous membranes moist.  Chest is clear to auscultation there is no labored breathing.  Heart is regular rate and rhythm without murmur gallop or rub. She does not really have significant lower extremity edema-pedal pulses are intact  Abdomen is soft does not appear to be acutely tender to palpation there are positive bowel sounds.  GU could not really appreciate suprapubic tenderness th.  Musculoskeletal d Does move all extremities 4 strength appears to be intact all 4 extremities I do not see any deformities other than arthritic she has been cleared to weight bear as tolerated    Neurologic is grossly intact her speech is clear no lateralizing findings.  Psych she is oriented to self is pleasant and appropriate I will classify her dementia has mild  to moderate with some confusion but is quite conversant very pleasant   Labs reviewed: Basic Metabolic Panel:  Recent Labs  09/13/16 0700 09/16/16  0700 09/20/16 0700  NA 137 136 135  K 3.8 3.8 4.1  CL 99* 98* 98*  CO2 29 29 28   GLUCOSE 126* 113* 132*  BUN 15 12 14   CREATININE 0.61 0.61 0.60  CALCIUM 8.7* 9.0 9.1   Liver Function Tests:  Recent Labs  09/12/16 0700 09/13/16 0700  AST 48* 46*  ALT 42 44  ALKPHOS 50 49  BILITOT 1.5* 0.9  1.0  PROT 6.6 6.2*  ALBUMIN 3.6 3.4*   No results for input(s): LIPASE, AMYLASE in the last 8760 hours. No results for input(s): AMMONIA in the last 8760 hours. CBC:  Recent Labs  09/10/16 0700 09/16/16 0700 09/20/16 0700 09/21/16 0805  WBC 9.7 9.7 13.1* 9.8  NEUTROABS 5.4  --  9.4* 6.5  HGB 12.9 12.1 10.9* 10.7*  HCT 38.5 36.4 33.5* 33.1*  MCV 68.0* 75.8* 69.4* 73.6*  PLT 210 438* 545* 497*   Cardiac Enzymes: No results for input(s): CKTOTAL, CKMB, CKMBINDEX, TROPONINI in the last 8760 hours. BNP: Invalid input(s): POCBNP CBG:  Recent Labs  09/08/16 0615 09/08/16 2110 09/09/16 0549  GLUCAP 136* 130* 124*    Procedures and Imaging Studies During Stay: No results found.  Assessment/Plan:    1 history left hip fracture with repair she is now weightbearing as tolerated appears to be doing well she does have Norco for pain-has been put on Fosamax as well as calcium for osteoporosis she also has baclofen as needed.  #2 history of acute blood loss anemia she is on iron hemoglobin appears to have stabilized at 10.7 will update this before discharge.  #3 history of hypertension this appears stable on Lopressor .  #4 history of diabetes this appears diet-controlled we did do CBGs A were fairly satisfactory in the lower to mid 100s.  #5 question history of hypo-thyroidism-we did do a TSH which were within normal range at 0.857 earlier this month will need follow-up by primary care provider on a periodic basis.  #6  history of dementia this appears fairly mild to moderate she is on Aricept as well as Namenda as she is doing quite well with supportive care and appears appropriate for assisted living.  #7 history of hyperlipidemia she is on statin and fish oil since her stay here was quite short was not aggressive pursuing a lipid panel will defer to primary care provider.  #8 history of depression? She is on Elavil appears to be tolerating this fairly unremarkably.  Again she will be going back to her assisted living facility she will need orthopedic follow-up we will update a CBC and metabolic panel before discharge to keep an eye on her renal function as well as hemoglobin.  [id number] note greater than 30 minutes spent on this discharge summary-greater than 50% of time spent coordinating plan of care for numerous diagnoses

## 2016-10-10 ENCOUNTER — Encounter (HOSPITAL_COMMUNITY)
Admission: RE | Admit: 2016-10-10 | Discharge: 2016-10-10 | Disposition: A | Discharge status: Home / Self Care | Payer: Medicare Other | Source: Skilled Nursing Facility | Attending: Internal Medicine | Admitting: Internal Medicine

## 2016-10-10 LAB — CBC WITH DIFFERENTIAL/PLATELET
Basophils Absolute: 0 10*3/uL (ref 0.0–0.1)
Basophils Relative: 0 %
EOS ABS: 0.2 10*3/uL (ref 0.0–0.7)
EOS PCT: 2 %
HCT: 34.8 % — ABNORMAL LOW (ref 36.0–46.0)
Hemoglobin: 11.5 g/dL — ABNORMAL LOW (ref 12.0–15.0)
LYMPHS ABS: 2.5 10*3/uL (ref 0.7–4.0)
LYMPHS PCT: 30 %
MCH: 24.7 pg — ABNORMAL LOW (ref 26.0–34.0)
MCHC: 33 g/dL (ref 30.0–36.0)
MCV: 74.7 fL — AB (ref 78.0–100.0)
MONOS PCT: 10 %
Monocytes Absolute: 0.9 10*3/uL (ref 0.1–1.0)
Neutro Abs: 4.9 10*3/uL (ref 1.7–7.7)
Neutrophils Relative %: 58 %
PLATELETS: 253 10*3/uL (ref 150–400)
RBC: 4.66 MIL/uL (ref 3.87–5.11)
RDW: 24.2 % — ABNORMAL HIGH (ref 11.5–15.5)
WBC: 8.4 10*3/uL (ref 4.0–10.5)

## 2016-10-10 LAB — COMPREHENSIVE METABOLIC PANEL
ALK PHOS: 87 U/L (ref 38–126)
ALT: 23 U/L (ref 14–54)
ANION GAP: 7 (ref 5–15)
AST: 27 U/L (ref 15–41)
Albumin: 3.7 g/dL (ref 3.5–5.0)
BUN: 11 mg/dL (ref 6–20)
CHLORIDE: 101 mmol/L (ref 101–111)
CO2: 31 mmol/L (ref 22–32)
CREATININE: 0.64 mg/dL (ref 0.44–1.00)
Calcium: 8.8 mg/dL — ABNORMAL LOW (ref 8.9–10.3)
Glucose, Bld: 102 mg/dL — ABNORMAL HIGH (ref 65–99)
Potassium: 3.8 mmol/L (ref 3.5–5.1)
Sodium: 139 mmol/L (ref 135–145)
Total Bilirubin: 0.9 mg/dL (ref 0.3–1.2)
Total Protein: 6.4 g/dL — ABNORMAL LOW (ref 6.5–8.1)

## 2016-10-30 ENCOUNTER — Non-Acute Institutional Stay (SKILLED_NURSING_FACILITY): Payer: Medicare Other | Admitting: Internal Medicine

## 2016-10-30 ENCOUNTER — Encounter: Payer: Self-pay | Admitting: Internal Medicine

## 2016-10-30 DIAGNOSIS — F0390 Unspecified dementia without behavioral disturbance: Secondary | ICD-10-CM

## 2016-10-30 DIAGNOSIS — D539 Nutritional anemia, unspecified: Secondary | ICD-10-CM

## 2016-10-30 DIAGNOSIS — S72142A Displaced intertrochanteric fracture of left femur, initial encounter for closed fracture: Secondary | ICD-10-CM

## 2016-10-30 DIAGNOSIS — M810 Age-related osteoporosis without current pathological fracture: Secondary | ICD-10-CM

## 2016-10-30 DIAGNOSIS — I10 Essential (primary) hypertension: Secondary | ICD-10-CM | POA: Diagnosis not present

## 2016-10-30 NOTE — Progress Notes (Signed)
Location:   [facility] Room Number: 154/W Place of Service:  SNF (31)  Provider: Crickett Abbett,Ayren Zumbro  PCP: Glo Herring, MD Patient Care Team: Redmond School, MD as PCP - General (Internal Medicine) Daneil Dolin, MD (Gastroenterology)  Extended Emergency Contact Information Primary Emergency Contact: [facility] Address: [address]          [place], [place] [address] Johnnette Litter of [place] Phone: [phone] Mobile Phone: [phone] Relation: Daughter Secondary Emergency Contact: Callegari,Arnie  [place] Phone: [phone] Relation: Son  Code Status: DNR Goals of care:  Advanced Directive information Advanced Directives 10/30/2016  Does Patient Have a Medical Advance Directive? Yes  Type of Advance Directive Out of facility DNR (pink MOST or yellow form)  Does patient want to make changes to medical advance directive? No - Patient declined     Allergies  Allergen Reactions  . Procaine Hcl Other (See Comments)    Numbness/jerkiness  . Sulfonamide Derivatives Other (See Comments)    hypertension  . Penicillins Rash    Has patient had a PCN reaction causing immediate rash, facial/tongue/throat swelling, SOB or lightheadedness with hypotension:YES Has patient had a PCN reaction causing severe rash involving mucus membranes or skin necrosis:NO Has patient had a PCN reaction that required hospitalizationNO Has patient had a PCN reaction occurring within the last 10 years: NO If all of the above answers are "NO", then may proceed with Cephalosporin use.    Chief Complaint  Patient presents with  . Discharge Note    HPI:  83 y.o. female  seen for discharge to an assisted living facility.  She actually was seen late last month for discharge but this was put on hold.  She will apparently be going today or later this week.  She was here for rehabilitation after sustaining a left hip fracture after a fall this  surgically was repaired-she has done well with therapy now is using a walker.  She had postop anemia but this is stabilized in fact was up to 11.5 on lab done on March 29-she is on iron.  She also had intermittent urinary retention on arrival but this has resolved it appears fairly unremarkably.  She has a history dementia but does well with supportive care she is on Aricept and Namenda.  No behaviors to my knowledge.  Her vital signs appear to be fairly stable at times her systolic is somewhat elevated but these are not persistent I see systolics ranging from 267 up to 159-she is on Lopressor.  She also has a history of diabetes type 2 which apparently is diet-controlled on arrival we did CBGs and they were fine usually in the lower to mid 100s.  Currently she has no complaints she is excited about going back to assisted living she will need orthopedic follow-up but it's thought to be doing well in this regards.      Past Medical History:  Diagnosis Date  . Atrial fibrillation ([place])   . Dementia   . Diabetes mellitus   . Diverticulitis 8/09, 3/10   abscess in 2009(medical tx)  . HTN (hypertension)   . Hypercholesteremia   . Hyperthyroidism    s/p radiation, surgery  . IDA (iron deficiency anemia)   . Osteoporosis   . Postoperative anemia due to acute blood loss 09/08/2016  . Thalassemia   . Tubular adenoma of colon 12/15/08   Colonosocpy Dr Tonita Cong removed cecum    Past Surgical History:  Procedure Laterality Date  .  CESAREAN SECTION     x2  . CHOLECYSTECTOMY    . FOOT SURGERY     bilat  . INTRAMEDULLARY (IM) NAIL INTERTROCHANTERIC Left 09/06/2016   Procedure: LEFT HIP INTRAMEDULLARY (IM) NAIL;  Surgeon: Renette Butters, MD;  Location: [facility];  Service: Orthopedics;  Laterality: Left;  . THYROIDECTOMY, PARTIAL        reports that she has never smoked. She has never used smokeless tobacco. She reports that she drinks alcohol. She reports that she does not use  drugs. Social History   Social History  . Marital status: Widowed    Spouse name: N/A  . Number of children: 4  . Years of education: 10th grade   Occupational History  . 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 Smoking status: Never Smoker  . Smokeless tobacco: Never Used  . Alcohol use Yes     Comment: occasionally   . Drug use: No  . Sexual activity: Not on file   Other Topics Concern  . Not on file   Social History Narrative   Lost 1 son-mental illness, htn age 35   Functional Status Survey:    Allergies  Allergen Reactions  . Procaine Hcl Other (See Comments)    Numbness/jerkiness  . Sulfonamide Derivatives Other (See Comments)    hypertension  . Penicillins Rash    Has patient had a PCN reaction causing immediate rash, facial/tongue/throat swelling, SOB or lightheadedness with hypotension:YES Has patient had a PCN reaction causing severe rash involving mucus membranes or skin necrosis:NO Has patient had a PCN reaction that required hospitalizationNO Has patient had a PCN reaction occurring within the last 10 years: NO If all of the above answers are "NO", then may proceed with Cephalosporin use.    Pertinent  Health Maintenance Due  Topic Date Due  . FOOT EXAM  08/09/1938  . OPHTHALMOLOGY EXAM  08/09/1938  . URINE MICROALBUMIN  08/09/1938  . HEMOGLOBIN A1C  07/18/2008  . INFLUENZA VACCINE  06/14/2017 (Originally 02/12/2017)  . PNA vac Low Risk Adult (1 of 2 - PCV13) 06/14/2017 (Originally 08/10/1995)  . DEXA SCAN  Completed    Medications: Outpatient Encounter Prescriptions as of 10/30/2016  Medication Sig  . alendronate (FOSAMAX) 70 MG tablet Take 70 mg by mouth once a week. Take with a full glass of water on an empty stomach. Take on Wednesdays  . amitriptyline (ELAVIL) 10 MG tablet Take 10 mg by mouth at bedtime.    . baclofen (LIORESAL) 10 MG tablet Take 1 tablet (10 mg total) by mouth 3 (three) times daily as needed for muscle spasms.  .  Calcium Carbonate-Vitamin D (CALCIUM + D PO) Take by mouth 2 (two) times daily.    . Coenzyme Q10 (CO Q-10) 100 MG CAPS Take by mouth.    . Cranberry 450 MG TABS Take 450 mg by mouth 2 (two) times daily.  . cycloSPORINE (RESTASIS) 0.05 % ophthalmic emulsion Place 1 drop into both eyes 2 (two) times daily.  Marland Kitchen Dextromethorphan-Guaifenesin (ROBITUSSIN DM) 10-100 MG/5ML liquid Take 10 mLs by mouth every 12 (twelve) hours.  . docusate sodium (COLACE) 100 MG capsule Take 100 mg by mouth 2 (two) times daily.  Marland Kitchen donepezil (ARICEPT) 5 MG tablet Take 5 mg by mouth at bedtime.  Marland Kitchen estradiol (VAGIFEM) 25 MCG vaginal tablet Place 25 mcg vaginally 2 (two) times a week.    . fish oil-omega-3 fatty acids 1000 MG capsule Take 2 g by mouth daily.    Marland Kitchen  Glucosamine-Chondroit-Vit C-Mn (SM GLUCOSAMINE-CHONDROITIN) 750-600 MG TABS Take 1 tablet by mouth once a day  . HYDROcodone-acetaminophen (NORCO/VICODIN) 5-325 MG tablet Take 1-2 tablets by mouth every 4 (four) hours as needed for moderate pain.  . iron polysaccharides (NIFEREX) 150 MG capsule Take 150 mg by mouth 2 (two) times daily.  . memantine (NAMENDA) 10 MG tablet Take 10 mg by mouth 2 (two) times daily.  . metoprolol tartrate (LOPRESSOR) 25 MG tablet Take 25 mg by mouth 2 (two) times daily.   . ondansetron (ZOFRAN) 4 MG tablet Take 1 tablet (4 mg total) by mouth every 8 (eight) hours as needed for nausea or vomiting.  . polyethylene glycol (MIRALAX / GLYCOLAX) packet Take 17 g by mouth daily as needed.  . senna (SENOKOT) 8.6 MG TABS tablet Take 1 tablet (8.6 mg total) by mouth daily.  . simvastatin (ZOCOR) 40 MG tablet Take 40 mg by mouth at bedtime.    . vitamin B-6 (PYRIDOXINE) 25 MG tablet Take 25 mg by mouth daily.  Marland Kitchen enoxaparin (LOVENOX) 40 MG/0.4ML injection Inject 0.4 mLs (40 mg total) into the skin daily. For 30 days post op for DVT prophylaxis   No facility-administered encounter medications on file as of 10/30/2016.      Review of Systems This  is somewhat sketchy secondary to dementia.  In general does not complaining any fever or chills.  Skin does not complain of rashes or itching.  Head ears eyes nose mouth and throat not complaining of any sore throat or visual changes.  Resp-- is not complaining of shortness breath or cough.  Cardiac no complaints of chest pain does not really have significant lower extremity edema  GI is not complaining of abdominal pain nausea vomiting diarrhea constipation.  GU  Does not complain of any dysuria. No recent complaints of urinary retention and  Musculoskeletal-- does not complain of joint pain.  Neurologic is not complaining of dizziness headache or numbness.  psych does have history of dementia and also possible depression I note she is on Elavil s She continues to be in good spirits looking very forward to going back to assisted living  Vitals:   10/30/16 1237  BP: (!) 159/77  Pulse: 63  Resp: 20  Temp: 98.1 F (36.7 C)  TempSrc: Oral  Weight: 132 lb 3.2 oz (60 kg)  Height: 5' 1.5" (1.562 m)  Of note she does have variable systolics ranging from 706-237 recently Body mass index is 24.57 kg/m. Physical Exam  In general this is a pleasant elderly female in no distress l  Her skin is warm and dry   Eyes sclera and the gentle are clear visual acuity appears grossly intact.  Oropharynx is clear mucous membranes moist.  Chest is clear to auscultation there is no labored breathing.  Heart is regular rate and rhythm without murmur gallop or rub. She does not really have significant lower extremity edema-pedal pulses are intact  Abdomen is soft does not appear to be acutely tender to palpation there are positive bowel sounds.  GU does not appear to have any suprapubic tenderness  Musculoskeletal d Does move all extremities 4 strength appears to be intact all 4 extremities I do not see any deformities she is now using a rolling  walker    Neurologic is grossly intact her speech is clear no lateralizing findings.  Psych she is oriented to self is pleasant and appropriate I would suspect her dementia is moderate she does well with supportive care  Labs reviewed: Basic Metabolic Panel:  Recent Labs  09/16/16 0700 09/20/16 0700 10/10/16 0700  NA 136 135 139  K 3.8 4.1 3.8  CL 98* 98* 101  CO2 29 28 31   GLUCOSE 113* 132* 102*  BUN 12 14 11   CREATININE 0.61 0.60 0.64  CALCIUM 9.0 9.1 8.8*   Liver Function Tests:  Recent Labs  09/12/16 0700 09/13/16 0700 10/10/16 0700  AST 48* 46* 27  ALT 42 44 23  ALKPHOS 50 49 87  BILITOT 1.5* 0.9  1.0 0.9  PROT 6.6 6.2* 6.4*  ALBUMIN 3.6 3.4* 3.7   No results for input(s): LIPASE, AMYLASE in the last 8760 hours. No results for input(s): AMMONIA in the last 8760 hours. CBC:  Recent Labs  09/20/16 0700 09/21/16 0805 10/10/16 0700  WBC 13.1* 9.8 8.4  NEUTROABS 9.4* 6.5 4.9  HGB 10.9* 10.7* 11.5*  HCT 33.5* 33.1* 34.8*  MCV 69.4* 73.6* 74.7*  PLT 545* 497* 253   Cardiac Enzymes: No results for input(s): CKTOTAL, CKMB, CKMBINDEX, TROPONINI in the last 8760 hours. BNP: Invalid input(s): POCBNP CBG:  Recent Labs  09/08/16 0615 09/08/16 2110 09/09/16 0549  GLUCAP 136* 130* 124*    Procedures and Imaging Studies During Stay: No results found.  Assessment/Plan:   l 1 history left hip fracture with repair she is  weightbearing as tolerated appears to be doing well she does have Norco for pain-has been put on Fosamax as well as calcium for osteoporosis she also has baclofen as needed.-At this point pain appears to be controlled she appears comfortable  #2 history of acute blood loss anemia she is on iron hemoglobin appears to have stabilized at 11.5 most recently will need follow-up by primary care provider  #3 history of hypertension has variable systolic readings but no consistent elevations at this point will defer to primary care  provider she is on Lopressor  .  #4 history of diabetes this appears diet-controlled we did do CBGs A were fairly satisfactory in the lower to mid 100s.  #5 question history of hypo-thyroidism-we did do a TSH which were within normal range at 0.857 last  month will need follow-up by primary care provider on a periodic basis.  #6 history of dementia this appears fairly mild to moderate she is on Aricept as well as Namenda as she is doing quite well with supportive care and appears appropriate for assisted living.  #7 history of hyperlipidemia she is on statin and fish oil since her stay here was quite short was not aggressive pursuing a lipid panel will defer to primary care provider.  #8 history of depression? She is on Elavil appears to be tolerating this fairly unremarkably. Is bright alert and in good spirits this afternoon  Again she will be going back to her assisted living facility she will need orthopedic follow-up--she also will need PT and OT for further strengthening.  In regards to him he will need a rolling walker as well as a wheelchair and bedside commode to avoid any possible fall risk-again she will be in assisted living.  We will prescribed 30 tablets of Norco 5-3 25 for pain when necessary.  No refills this would be followed by orthopedics or her primary care provider   [phone]    :

## 2019-11-12 ENCOUNTER — Emergency Department (HOSPITAL_COMMUNITY)
Admission: EM | Admit: 2019-11-12 | Discharge: 2019-11-13 | Disposition: A | Discharge status: Home / Self Care | Payer: Medicare HMO | Source: Home / Self Care | Attending: Emergency Medicine | Admitting: Emergency Medicine

## 2019-11-12 ENCOUNTER — Emergency Department (HOSPITAL_COMMUNITY): Payer: Medicare HMO

## 2019-11-12 DIAGNOSIS — Z79899 Other long term (current) drug therapy: Secondary | ICD-10-CM | POA: Insufficient documentation

## 2019-11-12 DIAGNOSIS — Z7984 Long term (current) use of oral hypoglycemic drugs: Secondary | ICD-10-CM | POA: Insufficient documentation

## 2019-11-12 DIAGNOSIS — R0789 Other chest pain: Secondary | ICD-10-CM

## 2019-11-12 DIAGNOSIS — Y9389 Activity, other specified: Secondary | ICD-10-CM | POA: Insufficient documentation

## 2019-11-12 DIAGNOSIS — W19XXXA Unspecified fall, initial encounter: Secondary | ICD-10-CM | POA: Insufficient documentation

## 2019-11-12 DIAGNOSIS — E119 Type 2 diabetes mellitus without complications: Secondary | ICD-10-CM | POA: Insufficient documentation

## 2019-11-12 DIAGNOSIS — I21A1 Myocardial infarction type 2: Secondary | ICD-10-CM | POA: Diagnosis not present

## 2019-11-12 DIAGNOSIS — F0390 Unspecified dementia without behavioral disturbance: Secondary | ICD-10-CM | POA: Insufficient documentation

## 2019-11-12 DIAGNOSIS — I10 Essential (primary) hypertension: Secondary | ICD-10-CM | POA: Insufficient documentation

## 2019-11-12 DIAGNOSIS — Y929 Unspecified place or not applicable: Secondary | ICD-10-CM | POA: Insufficient documentation

## 2019-11-12 DIAGNOSIS — I214 Non-ST elevation (NSTEMI) myocardial infarction: Secondary | ICD-10-CM | POA: Diagnosis not present

## 2019-11-12 DIAGNOSIS — I4891 Unspecified atrial fibrillation: Secondary | ICD-10-CM | POA: Insufficient documentation

## 2019-11-12 DIAGNOSIS — Y999 Unspecified external cause status: Secondary | ICD-10-CM | POA: Insufficient documentation

## 2019-11-12 LAB — BASIC METABOLIC PANEL
Anion gap: 10 (ref 5–15)
BUN: 13 mg/dL (ref 8–23)
CO2: 28 mmol/L (ref 22–32)
Calcium: 8.8 mg/dL — ABNORMAL LOW (ref 8.9–10.3)
Chloride: 103 mmol/L (ref 98–111)
Creatinine, Ser: 0.6 mg/dL (ref 0.44–1.00)
GFR calc Af Amer: >60 mL/min (ref >60)
GFR calc non Af Amer: >60 mL/min (ref >60)
Glucose, Bld: 105 mg/dL — ABNORMAL HIGH (ref 70–99)
Potassium: 3.5 mmol/L (ref 3.5–5.1)
Sodium: 141 mmol/L (ref 135–145)

## 2019-11-12 LAB — CBC WITH DIFFERENTIAL/PLATELET
Abs Immature Granulocytes: 0.04 10*3/uL (ref 0.00–0.07)
Basophils Absolute: 0 10*3/uL (ref 0.0–0.1)
Basophils Relative: 0 %
Eosinophils Absolute: 0.1 10*3/uL (ref 0.0–0.5)
Eosinophils Relative: 1 %
HCT: 37.5 % (ref 36.0–46.0)
Hemoglobin: 11.4 g/dL — ABNORMAL LOW (ref 12.0–15.0)
Immature Granulocytes: 0 %
Lymphocytes Relative: 24 %
Lymphs Abs: 2.9 10*3/uL (ref 0.7–4.0)
MCH: 20.4 pg — ABNORMAL LOW (ref 26.0–34.0)
MCHC: 30.4 g/dL (ref 30.0–36.0)
MCV: 67.2 fL — ABNORMAL LOW (ref 80.0–100.0)
Monocytes Absolute: 1.3 10*3/uL — ABNORMAL HIGH (ref 0.1–1.0)
Monocytes Relative: 11 %
Neutro Abs: 7.6 10*3/uL (ref 1.7–7.7)
Neutrophils Relative %: 64 %
Platelets: 272 10*3/uL (ref 150–400)
RBC: 5.58 MIL/uL — ABNORMAL HIGH (ref 3.87–5.11)
RDW: 18 % — ABNORMAL HIGH (ref 11.5–15.5)
WBC: 11.9 10*3/uL — ABNORMAL HIGH (ref 4.0–10.5)
nRBC: 0.2 % (ref 0.0–0.2)

## 2019-11-12 LAB — TROPONIN I (HIGH SENSITIVITY)
Troponin I (High Sensitivity): 12 ng/L (ref <18)
Troponin I (High Sensitivity): 20 ng/L — ABNORMAL HIGH (ref <18)

## 2019-11-12 MED ORDER — LIDOCAINE 5 % EX PTCH: 1.0000 | MEDICATED_PATCH | CUTANEOUS | 0 refills | Status: DC

## 2019-11-12 MED ORDER — HYDRALAZINE HCL 20 MG/ML IJ SOLN
5.0000 mg | Freq: Once | INTRAMUSCULAR | Status: AC
  Administered 2019-11-12: 5 mg via INTRAVENOUS
  Filled 2019-11-12: qty 1

## 2019-11-12 NOTE — Discharge Instructions (Addendum)
You have been diagnosed today with Right sided chest wall pain and high blood pressure.  At this time there does not appear to be the presence of an emergent medical condition, however there is always the potential for conditions to change. Please read and follow the below instructions.  Please return to the Emergency Department immediately for any new or worsening symptoms. Please be sure to follow up with your Primary Care Provider within one week regarding your visit today; please call their office to schedule an appointment even if you are feeling better for a follow-up visit. Please take blood pressure medications as prescribed.  See primary care doctor next week for blood pressure recheck. You may use the Lidoderm patches as prescribed to help with chest wall pain   Get help right away if you: Get a very bad headache. Start to feel mixed up (confused). Feel weak or numb. Feel faint. Have very bad pain in your: Chest. Belly (abdomen). Throw up more than once. Have trouble breathing. You have a cough that gets worse, or you cough up blood. You have very bad (severe) pain in your belly (abdomen). You pass out (faint). You have either of these for no clear reason: Sudden chest discomfort. Sudden discomfort in your arms, back, neck, or jaw. You have shortness of breath at any time. You suddenly start to sweat, or your skin gets clammy. You feel sick to your stomach (nauseous). You throw up (vomit). You suddenly feel lightheaded or dizzy. You feel very weak or tired. Your heart starts to beat fast, or it feels like it is skipping beats. You have any new/concerning or worsening of symptoms  Please read the additional information packets attached to your discharge summary.  Do not take your medicine if  develop an itchy rash, swelling in your mouth or lips, or difficulty breathing; call 911 and seek immediate emergency medical attention if this occurs.  Note: Portions of this text  may have been transcribed using voice recognition software. Every effort was made to ensure accuracy; however, inadvertent computerized transcription errors may still be present.

## 2019-11-12 NOTE — ED Provider Notes (Signed)
[facility] EMERGENCY DEPARTMENT Provider Note   CSN: [id number] Arrival date & time: 11/12/19  1645     History No chief complaint on file.   [name] is a 86 y.o. female history atrial fibrillation, dementia, hypertension, high cholesterol, osteoporosis, thalassemia.  Patient presents today for right lower rib pain onset this afternoon, she reports that she fell injuring her right ribs, she cannot quite recall what caused her to fall.  She reports sharp pain over the right ribs that is only with touching the area and is completely resolved with rest, pain is moderate intensity when it occurs, no active pain.  She denies any recent illness, reports she is otherwise feeling well.  Of note patient is alert to self, place and event is unsure of the year or month.  Level 5 caveat dementia  HPI     Past Medical History:  Diagnosis Date  . Atrial fibrillation ([place])   . Dementia   . Diabetes mellitus   . Diverticulitis 8/09, 3/10   abscess in 2009(medical tx)  . HTN (hypertension)   . Hypercholesteremia   . Hyperthyroidism    s/p radiation, surgery  . IDA (iron deficiency anemia)   . Osteoporosis   . Postoperative anemia due to acute blood loss 09/08/2016  . Thalassemia   . Tubular adenoma of colon 12/15/08   Colonosocpy Dr Tonita Cong removed cecum    Patient Active Problem List   Diagnosis Date Noted  . Postoperative anemia due to acute blood loss 09/08/2016  . Closed intertrochanteric fracture of left hip, initial encounter ([place]) 09/05/2016  . Dementia ([place]) 09/05/2016  . Abdominal pain 01/10/2011  . DERANGEMENT OF POSTERIOR HORN OF MEDIAL MENISCUS 09/25/2010  . CLOSED DISLOCATION OF DISTAL RADIOULNAR 03/28/2010  . COLONIC POLYPS, ADENOMATOUS, HX OF 07/31/2009  . PAROXYSMAL ATRIAL FIBRILLATION 10/26/2008  . Deficiency anemia 10/25/2008  . OTHER THALASSEMIA 10/25/2008  . DIVERTICULITIS, COLON 10/25/2008  . Diabetes mellitus due to underlying condition,  controlled ([place]) 10/24/2008  . HYPERCHOLESTEROLEMIA 10/24/2008  . Iron deficiency anemia 10/24/2008  . Essential hypertension 10/24/2008  . Osteoporosis 10/24/2008  . HYPERTHYROIDISM, HX OF 10/24/2008    Past Surgical History:  Procedure Laterality Date  . CESAREAN SECTION     x2  . CHOLECYSTECTOMY    . FOOT SURGERY     bilat  . INTRAMEDULLARY (IM) NAIL INTERTROCHANTERIC Left 09/06/2016   Procedure: LEFT HIP INTRAMEDULLARY (IM) NAIL;  Surgeon: Renette Butters, MD;  Location: [facility];  Service: Orthopedics;  Laterality: Left;  . THYROIDECTOMY, PARTIAL       OB History   No obstetric history on file.     Family History  Problem Relation Age of Onset  . Pneumonia Father   . Coronary artery disease Mother   . Stroke Mother   . Colon cancer Mother 11  . Thalassemia Sister     Social History   Tobacco Use  . Smoking status: Never Smoker  . Smokeless tobacco: Never Used  Substance Use Topics  . Alcohol use: Yes    Comment: occasionally   . Drug use: No    Home Medications Prior to Admission medications   Medication Sig Start Date End Date Taking? Authorizing Provider  alendronate (FOSAMAX) 70 MG tablet Take 70 mg by mouth once a week. Take with a full glass of water on an empty stomach. Take on Wednesdays    [provider]  amitriptyline (ELAVIL) 10 MG tablet Take 10 mg by mouth at bedtime.  [provider]  baclofen (LIORESAL) 10 MG tablet Take 1 tablet (10 mg total) by mouth 3 (three) times daily as needed for muscle spasms. 09/06/16   Prudencio Burly III, PA-C  Calcium Carbonate-Vitamin D (CALCIUM + D PO) Take by mouth 2 (two) times daily.      [provider]  Coenzyme Q10 (CO Q-10) 100 MG CAPS Take by mouth.      [provider]  Cranberry 450 MG TABS Take 450 mg by mouth 2 (two) times daily.    [provider]  cycloSPORINE (RESTASIS) 0.05 % ophthalmic emulsion Place 1 drop into both eyes 2 (two) times daily.     [provider]  Dextromethorphan-Guaifenesin (ROBITUSSIN DM) 10-100 MG/5ML liquid Take 10 mLs by mouth every 12 (twelve) hours.    [provider]  docusate sodium (COLACE) 100 MG capsule Take 100 mg by mouth 2 (two) times daily.    [provider]  donepezil (ARICEPT) 5 MG tablet Take 5 mg by mouth at bedtime.    [provider]  enoxaparin (LOVENOX) 40 MG/0.4ML injection Inject 0.4 mLs (40 mg total) into the skin daily. For 30 days post op for DVT prophylaxis 09/06/16 10/06/16  Prudencio Burly III, PA-C  estradiol (VAGIFEM) 25 MCG vaginal tablet Place 25 mcg vaginally 2 (two) times a week.      [provider]  fish oil-omega-3 fatty acids 1000 MG capsule Take 2 g by mouth daily.      [provider]  Glucosamine-Chondroit-Vit C-Mn (SM GLUCOSAMINE-CHONDROITIN) 750-600 MG TABS Take 1 tablet by mouth once a day    [provider]  HYDROcodone-acetaminophen (NORCO/VICODIN) 5-325 MG tablet Take 1-2 tablets by mouth every 4 (four) hours as needed for moderate pain. 10/01/16   Estill Dooms, MD  iron polysaccharides (NIFEREX) 150 MG capsule Take 150 mg by mouth 2 (two) times daily.    [provider]  lidocaine (LIDODERM) 5 % Place 1 patch onto the skin daily. Remove & Discard patch within 12 hours or as directed by MD 11/12/19   Deliah Boston, PA-C  memantine (NAMENDA) 10 MG tablet Take 10 mg by mouth 2 (two) times daily.    [provider]  metoprolol tartrate (LOPRESSOR) 25 MG tablet Take 25 mg by mouth 2 (two) times daily.     [provider]  ondansetron (ZOFRAN) 4 MG tablet Take 1 tablet (4 mg total) by mouth every 8 (eight) hours as needed for nausea or vomiting. 09/06/16   Prudencio Burly III, PA-C  polyethylene glycol (MIRALAX / GLYCOLAX) packet Take 17 g by mouth daily as needed. 09/09/16   Domenic Polite, MD  senna (SENOKOT) 8.6 MG TABS tablet Take 1 tablet (8.6 mg total) by mouth  daily. 09/09/16   Domenic Polite, MD  simvastatin (ZOCOR) 40 MG tablet Take 40 mg by mouth at bedtime.      [provider]  vitamin B-6 (PYRIDOXINE) 25 MG tablet Take 25 mg by mouth daily.    [provider]    Allergies    Procaine hcl, Sulfonamide derivatives, and Penicillins  Review of Systems   Review of Systems  Unable to perform ROS: Dementia    Physical Exam Updated Vital Signs BP (!) 203/77   Pulse 78   Temp 99 F (37.2 C) (Oral)   Resp 19   Ht 5\' 2"  (1.575 m)   Wt 63.5 kg   SpO2 95%   BMI 25.61 kg/m  Physical Exam Constitutional:      General: She is not in acute distress.    Appearance: Normal appearance. She is well-developed. She is not ill-appearing or diaphoretic.  HENT:     Head: Normocephalic and atraumatic.     Right Ear: External ear normal.     Left Ear: External ear normal.     Nose: Nose normal.  Eyes:     General: Vision grossly intact. Gaze aligned appropriately.     Pupils: Pupils are equal, round, and reactive to light.  Neck:     Trachea: Trachea and phonation normal. No tracheal deviation.  Cardiovascular:     Rate and Rhythm: Normal rate and regular rhythm.     Pulses: Normal pulses.     Heart sounds: Normal heart sounds.  Pulmonary:     Effort: Pulmonary effort is normal. No respiratory distress.     Breath sounds: Normal breath sounds.  Chest:       Comments: Tenderness to palpation without any overlying skin changes, no rash. Abdominal:     General: There is no distension.     Palpations: Abdomen is soft.     Tenderness: There is no abdominal tenderness. There is no guarding or rebound.  Musculoskeletal:        General: Normal range of motion.     Cervical back: Normal range of motion.  Skin:    General: Skin is warm and dry.  Neurological:     Mental Status: She is alert.     GCS: GCS eye subscore is 4. GCS verbal subscore is 5. GCS motor subscore is 6.     Comments: Speech is clear and goal oriented,  follows commands Major Cranial nerves without deficit, no facial droop Moves extremities without ataxia, coordination intact  Psychiatric:        Behavior: Behavior normal.     ED Results / Procedures / Treatments   Labs (all labs ordered are listed, but only abnormal results are displayed) Labs Reviewed  CBC WITH DIFFERENTIAL/PLATELET - Abnormal; Notable for the following components:      Result Value   WBC 11.9 (*)    RBC 5.58 (*)    Hemoglobin 11.4 (*)    MCV 67.2 (*)    MCH 20.4 (*)    RDW 18.0 (*)    Monocytes Absolute 1.3 (*)    All other components within normal limits  BASIC METABOLIC PANEL - Abnormal; Notable for the following components:   Glucose, Bld 105 (*)    Calcium 8.8 (*)    All other components within normal limits  TROPONIN I (HIGH SENSITIVITY)  TROPONIN I (HIGH SENSITIVITY)    EKG None  Radiology DG Ribs Unilateral W/Chest Right  Result Date: 11/12/2019 CLINICAL DATA:  Right-sided rib pain, no known injury, initial encounter EXAM: RIGHT RIBS AND CHEST - 3+ VIEW COMPARISON:  09/05/2016 FINDINGS: Cardiac shadow is within normal limits. Aortic calcifications are seen. Mild interstitial changes are again identified. No rib fractures are noted IMPRESSION: No rib fracture is noted. Electronically Signed   By: Inez Catalina M.D.   On: 11/12/2019 18:42    Procedures Procedures (including critical care time)  Medications Ordered in ED Medications  hydrALAZINE (APRESOLINE) injection 5 mg (5 mg Intravenous Given 11/12/19 2127)    ED Course  I have reviewed the triage vital signs and the nursing notes.  Pertinent labs & imaging results that were available during my care of the patient were reviewed by me and considered in  my medical decision making (see chart for details).    MDM Rules/Calculators/A&P                     86 year old female with history as detailed above presents today for right-sided rib pain, she reports that she fell earlier, there is no  external evidence of trauma or skin changes today.  She is acutely tender to the right lower ribs with light palpation.  She reports that pain has resolved she is feeling well and has no concerns.  She is alert to self and place not to month or year.  No answer at [facility].  Unable to find a triage note related to why EMS brought her.  X-ray of the ribs was ordered by triage staff.  I have added an EKG, suspect patient may have musculoskeletal injury at this time, she is currently pain-free. - DG Chest w/ Right Ribs:  IMPRESSION:  No rib fracture is noted.  I personally reviewed patient's x-ray and agree with radiologist interpretation.  I attempted to contact [facility] however there was no answer.   - 7:40 PM: Repeat blood pressure was elevated.  I again attempted to find a triage note however it appears this happened prior to shift change and no note was placed.  Patient's son Dionne Bucy is at bedside.  Arnie reports that the history he obtained from the nursing facility was that patient was sitting in her wheelchair talking with a friend when all of a sudden she had right side pain that caused her to cry, no fall.  He reports that patient does fill in the gaps in her memory and often makes up stories.  On reassessment patient is resting comfortably, smiling and pleasant no acute distress.  Denies pain at this time reports that she is feeling well.  Since there does not appear to have been a fall and patient is hypertensive I have added on blood work putting CBC, BMP and high-sensitivity troponin for assessment of cardiac etiology.  EKG obtained at 7:39 PM reviewed with Dr. Roderic Palau without acute ischemic changes. ------------ High-sensitivity troponin within normal limits, 12.  CBC shows baseline anemia of 11.4.  Mild leukocytosis of 11.9 without left shift.  BMP shows no emergent electrolyte derangement or evidence of kidney injury.  Patient was given 5 mg of hydralazine for blood pressure  control. - Patient has remained chest pain-free in the ER today.  Doubt ACS, dissection, PE emergent cardiopulmonary etiology of patient's symptoms today.  Pain is consistently reproducible with palpation of the right ribs, suspect musculoskeletal etiology at this time.   - Patient seen and evaluated by Dr. Roderic Palau.  Will obtain delta troponin, if negative discharge home.  I will prescribe patient Lidoderm patches for suspected musculoskeletal etiology.   - Care handoff given to [facility], PA-C at shift change, plan of care is Dr. Troponin if negative discharge.  Note: Portions of this report may have been transcribed using voice recognition software. Every effort was made to ensure accuracy; however, inadvertent computerized transcription errors may still be present. Final Clinical Impression(s) / ED Diagnoses Final diagnoses:  Right-sided chest wall pain  Asymptomatic hypertension    Rx / DC Orders ED Discharge Orders         Ordered    lidocaine (LIDODERM) 5 %  Every 24 hours     11/12/19 2238           Gari Crown 11/12/19 2238    Milton Ferguson, MD  11/14/19 0758  

## 2019-11-12 NOTE — ED Provider Notes (Signed)
   Patient signed out to me by Nuala Alpha, PA-C at end of shift, pending review of delta troponin.  Patient here with right rib pain and fall earlier today.   reproducible pain of the right lower ribs patient was noted to be hypertensive and given hydralazine here.  Patient has been chest pain-free in the ER.  Patient was seen by Dr. Roderic Palau and care plan discussed.  Low clinical suspicion for ACS dissection or PE.  Initial trop 12.   Delta troponin 20, discussed this with Dr. Roderic Palau.  It was felt that patient is appropriate for discharge home given the absence of cardiac related sx's.  Pt given strict return precautions.  Will continue her current antihypertensive.    Labs Reviewed  CBC WITH DIFFERENTIAL/PLATELET - Abnormal; Notable for the following components:      Result Value   WBC 11.9 (*)    RBC 5.58 (*)    Hemoglobin 11.4 (*)    MCV 67.2 (*)    MCH 20.4 (*)    RDW 18.0 (*)    Monocytes Absolute 1.3 (*)    All other components within normal limits  BASIC METABOLIC PANEL - Abnormal; Notable for the following components:   Glucose, Bld 105 (*)    Calcium 8.8 (*)    All other components within normal limits  TROPONIN I (HIGH SENSITIVITY) - Abnormal; Notable for the following components:   Troponin I (High Sensitivity) 20 (*)    All other components within normal limits  TROPONIN I (HIGH SENSITIVITY)  TROPONIN I (HIGH SENSITIVITY)      Kem Parkinson, PA-C 11/12/19 2345    Milton Ferguson, MD 11/14/19 [phone]

## 2019-11-13 MED ORDER — HYDROCODONE-ACETAMINOPHEN 5-325 MG PO TABS
1.0000 | ORAL_TABLET | Freq: Once | ORAL | Status: AC
  Administered 2019-11-13: 1 via ORAL
  Filled 2019-11-13: qty 1

## 2019-11-15 ENCOUNTER — Encounter (HOSPITAL_COMMUNITY): Payer: Self-pay

## 2019-11-15 ENCOUNTER — Emergency Department (HOSPITAL_COMMUNITY): Payer: Medicare HMO

## 2019-11-15 ENCOUNTER — Inpatient Hospital Stay (HOSPITAL_COMMUNITY)
Admission: EM | Admit: 2019-11-15 | Discharge: 2019-11-18 | DRG: 281 | Disposition: A | Discharge status: Nursing Home (Includes ICF) | Payer: Medicare HMO | Source: Skilled Nursing Facility | Attending: Family Medicine | Admitting: Family Medicine

## 2019-11-15 ENCOUNTER — Other Ambulatory Visit: Payer: Self-pay

## 2019-11-15 DIAGNOSIS — Z882 Allergy status to sulfonamides status: Secondary | ICD-10-CM

## 2019-11-15 DIAGNOSIS — R4189 Other symptoms and signs involving cognitive functions and awareness: Secondary | ICD-10-CM | POA: Diagnosis present

## 2019-11-15 DIAGNOSIS — I712 Thoracic aortic aneurysm, without rupture: Secondary | ICD-10-CM | POA: Diagnosis present

## 2019-11-15 DIAGNOSIS — Z7983 Long term (current) use of bisphosphonates: Secondary | ICD-10-CM

## 2019-11-15 DIAGNOSIS — E7800 Pure hypercholesterolemia, unspecified: Secondary | ICD-10-CM | POA: Diagnosis present

## 2019-11-15 DIAGNOSIS — D569 Thalassemia, unspecified: Secondary | ICD-10-CM | POA: Diagnosis present

## 2019-11-15 DIAGNOSIS — Z20822 Contact with and (suspected) exposure to covid-19: Secondary | ICD-10-CM | POA: Diagnosis present

## 2019-11-15 DIAGNOSIS — Z923 Personal history of irradiation: Secondary | ICD-10-CM

## 2019-11-15 DIAGNOSIS — M810 Age-related osteoporosis without current pathological fracture: Secondary | ICD-10-CM | POA: Diagnosis present

## 2019-11-15 DIAGNOSIS — Z880 Allergy status to penicillin: Secondary | ICD-10-CM | POA: Diagnosis not present

## 2019-11-15 DIAGNOSIS — I10 Essential (primary) hypertension: Secondary | ICD-10-CM | POA: Diagnosis not present

## 2019-11-15 DIAGNOSIS — I214 Non-ST elevation (NSTEMI) myocardial infarction: Secondary | ICD-10-CM | POA: Diagnosis present

## 2019-11-15 DIAGNOSIS — I429 Cardiomyopathy, unspecified: Secondary | ICD-10-CM

## 2019-11-15 DIAGNOSIS — I080 Rheumatic disorders of both mitral and aortic valves: Secondary | ICD-10-CM | POA: Diagnosis present

## 2019-11-15 DIAGNOSIS — I21A1 Myocardial infarction type 2: Secondary | ICD-10-CM | POA: Diagnosis present

## 2019-11-15 DIAGNOSIS — E119 Type 2 diabetes mellitus without complications: Secondary | ICD-10-CM | POA: Diagnosis present

## 2019-11-15 DIAGNOSIS — E785 Hyperlipidemia, unspecified: Secondary | ICD-10-CM | POA: Diagnosis present

## 2019-11-15 DIAGNOSIS — Z8249 Family history of ischemic heart disease and other diseases of the circulatory system: Secondary | ICD-10-CM

## 2019-11-15 DIAGNOSIS — I351 Nonrheumatic aortic (valve) insufficiency: Secondary | ICD-10-CM | POA: Diagnosis not present

## 2019-11-15 DIAGNOSIS — I5042 Chronic combined systolic (congestive) and diastolic (congestive) heart failure: Secondary | ICD-10-CM | POA: Diagnosis present

## 2019-11-15 DIAGNOSIS — J90 Pleural effusion, not elsewhere classified: Secondary | ICD-10-CM

## 2019-11-15 DIAGNOSIS — Z66 Do not resuscitate: Secondary | ICD-10-CM | POA: Diagnosis present

## 2019-11-15 DIAGNOSIS — E089 Diabetes mellitus due to underlying condition without complications: Secondary | ICD-10-CM | POA: Diagnosis not present

## 2019-11-15 DIAGNOSIS — F0390 Unspecified dementia without behavioral disturbance: Secondary | ICD-10-CM | POA: Diagnosis present

## 2019-11-15 DIAGNOSIS — I160 Hypertensive urgency: Secondary | ICD-10-CM | POA: Diagnosis present

## 2019-11-15 DIAGNOSIS — Z91030 Bee allergy status: Secondary | ICD-10-CM

## 2019-11-15 DIAGNOSIS — I340 Nonrheumatic mitral (valve) insufficiency: Secondary | ICD-10-CM | POA: Diagnosis not present

## 2019-11-15 DIAGNOSIS — R339 Retention of urine, unspecified: Secondary | ICD-10-CM | POA: Diagnosis present

## 2019-11-15 DIAGNOSIS — I480 Paroxysmal atrial fibrillation: Secondary | ICD-10-CM | POA: Diagnosis present

## 2019-11-15 DIAGNOSIS — Z9181 History of falling: Secondary | ICD-10-CM

## 2019-11-15 DIAGNOSIS — I428 Other cardiomyopathies: Secondary | ICD-10-CM | POA: Diagnosis present

## 2019-11-15 DIAGNOSIS — Z800 Family history of malignant neoplasm of digestive organs: Secondary | ICD-10-CM

## 2019-11-15 DIAGNOSIS — I110 Hypertensive heart disease with heart failure: Secondary | ICD-10-CM | POA: Diagnosis present

## 2019-11-15 DIAGNOSIS — R079 Chest pain, unspecified: Secondary | ICD-10-CM

## 2019-11-15 DIAGNOSIS — I4891 Unspecified atrial fibrillation: Secondary | ICD-10-CM | POA: Diagnosis present

## 2019-11-15 DIAGNOSIS — I7121 Aneurysm of the ascending aorta, without rupture: Secondary | ICD-10-CM | POA: Diagnosis present

## 2019-11-15 DIAGNOSIS — Z79899 Other long term (current) drug therapy: Secondary | ICD-10-CM | POA: Diagnosis not present

## 2019-11-15 DIAGNOSIS — Z823 Family history of stroke: Secondary | ICD-10-CM

## 2019-11-15 DIAGNOSIS — Z888 Allergy status to other drugs, medicaments and biological substances status: Secondary | ICD-10-CM

## 2019-11-15 DIAGNOSIS — Z881 Allergy status to other antibiotic agents status: Secondary | ICD-10-CM

## 2019-11-15 DIAGNOSIS — Z9049 Acquired absence of other specified parts of digestive tract: Secondary | ICD-10-CM

## 2019-11-15 HISTORY — DX: Hyperlipidemia, unspecified: E78.5

## 2019-11-15 HISTORY — DX: Type 2 diabetes mellitus without complications: E11.9

## 2019-11-15 HISTORY — DX: Essential (primary) hypertension: I10

## 2019-11-15 LAB — URINALYSIS, ROUTINE W REFLEX MICROSCOPIC
Bilirubin Urine: NEGATIVE
Glucose, UA: NEGATIVE mg/dL
Hgb urine dipstick: NEGATIVE
Ketones, ur: 20 mg/dL — AB
Leukocytes,Ua: NEGATIVE
Nitrite: NEGATIVE
Protein, ur: 30 mg/dL — AB
Specific Gravity, Urine: 1.03 (ref 1.005–1.030)
pH: 5 (ref 5.0–8.0)

## 2019-11-15 LAB — COMPREHENSIVE METABOLIC PANEL
ALT: 24 U/L (ref 0–44)
AST: 34 U/L (ref 15–41)
Albumin: 3.8 g/dL (ref 3.5–5.0)
Alkaline Phosphatase: 52 U/L (ref 38–126)
Anion gap: 10 (ref 5–15)
BUN: 22 mg/dL (ref 8–23)
CO2: 25 mmol/L (ref 22–32)
Calcium: 8.7 mg/dL — ABNORMAL LOW (ref 8.9–10.3)
Chloride: 103 mmol/L (ref 98–111)
Creatinine, Ser: 0.7 mg/dL (ref 0.44–1.00)
GFR calc Af Amer: >60 mL/min (ref >60)
GFR calc non Af Amer: >60 mL/min (ref >60)
Glucose, Bld: 166 mg/dL — ABNORMAL HIGH (ref 70–99)
Potassium: 3.7 mmol/L (ref 3.5–5.1)
Sodium: 138 mmol/L (ref 135–145)
Total Bilirubin: 1.5 mg/dL — ABNORMAL HIGH (ref 0.3–1.2)
Total Protein: 6.1 g/dL — ABNORMAL LOW (ref 6.5–8.1)

## 2019-11-15 LAB — CBC WITH DIFFERENTIAL/PLATELET
Abs Immature Granulocytes: 0.07 10*3/uL (ref 0.00–0.07)
Basophils Absolute: 0.1 10*3/uL (ref 0.0–0.1)
Basophils Relative: 0 %
Eosinophils Absolute: 0 10*3/uL (ref 0.0–0.5)
Eosinophils Relative: 0 %
HCT: 32.2 % — ABNORMAL LOW (ref 36.0–46.0)
Hemoglobin: 10.4 g/dL — ABNORMAL LOW (ref 12.0–15.0)
Immature Granulocytes: 1 %
Lymphocytes Relative: 10 %
Lymphs Abs: 1.3 10*3/uL (ref 0.7–4.0)
MCH: 22.6 pg — ABNORMAL LOW (ref 26.0–34.0)
MCHC: 32.3 g/dL (ref 30.0–36.0)
MCV: 69.8 fL — ABNORMAL LOW (ref 80.0–100.0)
Monocytes Absolute: 1.4 10*3/uL — ABNORMAL HIGH (ref 0.1–1.0)
Monocytes Relative: 11 %
Neutro Abs: 10.8 10*3/uL — ABNORMAL HIGH (ref 1.7–7.7)
Neutrophils Relative %: 78 %
Platelets: 230 10*3/uL (ref 150–400)
RBC: 4.61 MIL/uL (ref 3.87–5.11)
RDW: 18.8 % — ABNORMAL HIGH (ref 11.5–15.5)
WBC: 13.6 10*3/uL — ABNORMAL HIGH (ref 4.0–10.5)
nRBC: 0.2 % (ref 0.0–0.2)

## 2019-11-15 LAB — TROPONIN I (HIGH SENSITIVITY)
Troponin I (High Sensitivity): 1332 ng/L (ref <18)
Troponin I (High Sensitivity): 1433 ng/L (ref <18)

## 2019-11-15 LAB — RESPIRATORY PANEL BY RT PCR (FLU A&B, COVID)
Influenza A by PCR: NEGATIVE
Influenza B by PCR: NEGATIVE
SARS Coronavirus 2 by RT PCR: NEGATIVE

## 2019-11-15 LAB — LIPASE, BLOOD: Lipase: 15 U/L (ref 11–51)

## 2019-11-15 MED ORDER — IOHEXOL 350 MG/ML SOLN
75.0000 mL | Freq: Once | INTRAVENOUS | Status: AC | PRN
  Administered 2019-11-15: 75 mL via INTRAVENOUS

## 2019-11-15 MED ORDER — HEPARIN BOLUS VIA INFUSION
3000.0000 [IU] | Freq: Once | INTRAVENOUS | Status: AC
  Administered 2019-11-15: 3000 [IU] via INTRAVENOUS

## 2019-11-15 MED ORDER — HEPARIN (PORCINE) 25000 UT/250ML-% IV SOLN
1200.0000 [IU]/h | INTRAVENOUS | Status: DC
  Administered 2019-11-15: 900 [IU]/h via INTRAVENOUS
  Administered 2019-11-16: 1000 [IU]/h via INTRAVENOUS
  Filled 2019-11-15 (×2): qty 250

## 2019-11-15 MED ORDER — ACETAMINOPHEN 325 MG PO TABS
650.0000 mg | ORAL_TABLET | Freq: Once | ORAL | Status: AC
  Administered 2019-11-15: 650 mg via ORAL
  Filled 2019-11-15: qty 2

## 2019-11-15 MED ORDER — METOPROLOL TARTRATE 25 MG PO TABS
12.5000 mg | ORAL_TABLET | Freq: Once | ORAL | Status: AC
  Administered 2019-11-15: 12.5 mg via ORAL
  Filled 2019-11-15: qty 1

## 2019-11-15 MED ORDER — IOHEXOL 300 MG/ML  SOLN
100.0000 mL | Freq: Once | INTRAMUSCULAR | Status: AC | PRN
  Administered 2019-11-15: 20:00:00 100 mL via INTRAVENOUS

## 2019-11-15 NOTE — ED Notes (Signed)
NO emesis noted

## 2019-11-15 NOTE — ED Triage Notes (Signed)
Pt brought to ED via [facility] from [facility] for vomiting per staff. Pt denies vomiting.

## 2019-11-15 NOTE — ED Notes (Signed)
Walked pt. o2 93-96% on room air No dizziness or light-headedness

## 2019-11-15 NOTE — ED Notes (Signed)
CRITICAL VALUE ALERT  Critical Value:  Troponin 1332  Date & Time Notied:  11/15/19 & 2150hrs  Provider Notified: Dr. Karlton Lemon  Orders Received/Actions taken: notified

## 2019-11-15 NOTE — ED Notes (Signed)
Frozen dinner given to pt to attempt to eat.

## 2019-11-15 NOTE — Progress Notes (Signed)
ANTICOAGULATION CONSULT NOTE - Initial Consult  Pharmacy Consult for heparin Indication: chest pain/ACS  Allergies  Allergen Reactions  . Bee Venom     Listed per Nursing Center-No reaction is noted  . Carbapenems     Listed per Nursing Center-No reaction is noted  . Cephalosporins     Listed per Nursing Center-No reaction is noted  . Procaine Hcl Other (See Comments)    Numbness/jerkiness  . Sulfonamide Derivatives Other (See Comments)    hypertension  . Penicillins Rash    Has patient had a PCN reaction causing immediate rash, facial/tongue/throat swelling, SOB or lightheadedness with hypotension:YES Has patient had a PCN reaction causing severe rash involving mucus membranes or skin necrosis:NO Has patient had a PCN reaction that required hospitalizationNO Has patient had a PCN reaction occurring within the last 10 years: NO If all of the above answers are "NO", then may proceed with Cephalosporin use.    Patient Measurements: Height: 5\' 2"  (157.5 cm) Weight: 68 kg (150 lb) IBW/kg (Calculated) : 50.1 Heparin Dosing Weight: 65kg  Vital Signs: Temp: 99.2 F (37.3 C) (05/03 1652) Temp Source: Oral (05/03 1652) BP: 186/92 (05/03 2030) Pulse Rate: 84 (05/03 2122)  Labs: Recent Labs    11/15/19 1758  HGB 10.4*  HCT 32.2*  PLT 230  CREATININE 0.70  TROPONINIHS 1,332*    Estimated Creatinine Clearance: 43.1 mL/min (by C-G formula based on SCr of 0.7 mg/dL).   Medical History: Past Medical History:  Diagnosis Date  . Atrial fibrillation ([place])   . Dementia ([place])   . Diabetes mellitus   . Diverticulitis 8/09, 3/10   abscess in 2009(medical tx)  . HTN (hypertension)   . Hypercholesteremia   . Hyperthyroidism    s/p radiation, surgery  . IDA (iron deficiency anemia)   . Osteoporosis   . Postoperative anemia due to acute blood loss 09/08/2016  . Thalassemia   . Tubular adenoma of colon 12/15/08   Colonosocpy Dr Tonita Cong removed cecum    Assessment: 86yo  female presents from assisted living facility for decreased appetite and emesis, no c/o from pt but limited w/ dementia, troponin found to be elevated (was 20 during ED visit 3d ago, now 1332), concern for ACS, to begin heparin; h/o Afib noted but not on anticoag PTA, likely d/t recurrent falls.  Goal of Therapy:  Heparin level 0.3-0.7 units/ml Monitor platelets by anticoagulation protocol: Yes   Plan:  Will give heparin 3000 units IV bolus x1 followed by gtt at 900 units/hr and monitor heparin levels and CBC.  Wynona Neat, PharmD, BCPS  11/15/2019,10:51 PM

## 2019-11-15 NOTE — H&P (Addendum)
TRH H&P    Patient Demographics:    [name], is a 86 y.o. female  MRN: [id number]  DOB - 04/20/1929  Admit Date - 11/15/2019  Referring MD/NP/PA: Rodell Perna  Outpatient Primary MD for the patient is Redmond School, MD  Patient coming from: Skilled nursing facility  Chief complaint-vomiting   HPI:    [name]  is a 86 y.o. female, with medical history of atrial fibrillation not on anticoagulation, dementia, diabetes mellitus type 2, hypertension, hyperlipidemia, osteoporosis was brought to the ED from [facility] assisted facility for evaluation of vomiting.  Patient was seen in the hospital 3 days ago at that time her work-up was reassuring and was found to stable to discharge back to the facility.  Patient had decreased appetite, has been more tired and had 2 episodes of nonbloody nonbilious emesis.  She also complained of chest pain with radiation to upper back in the ED.  In the ED patient was found to be in hypertensive with elevated troponin I 332.  Cardiology fellow was consulted by ED provider who recommended starting her on heparin anticoagulation. Patient denies shortness of breath. Denies nausea or vomiting at this time. Denies abdominal pain or dysuria.     Review of systems:    In addition to the HPI above,   No significant Mental Stressors.  All other systems reviewed and are negative.    Past History of the following :    Past Medical History:  Diagnosis Date  . Atrial fibrillation ([place])   . Dementia ([place])   . Diabetes mellitus   . Diverticulitis 8/09, 3/10   abscess in 2009(medical tx)  . HTN (hypertension)   . Hypercholesteremia   . Hyperthyroidism    s/p radiation, surgery  . IDA (iron deficiency anemia)   . Osteoporosis   . Postoperative anemia due to acute blood loss 09/08/2016  . Thalassemia   . Tubular adenoma of colon 12/15/08   Colonosocpy Dr Tonita Cong  removed cecum      Past Surgical History:  Procedure Laterality Date  . CESAREAN SECTION     x2  . CHOLECYSTECTOMY    . FOOT SURGERY     bilat  . INTRAMEDULLARY (IM) NAIL INTERTROCHANTERIC Left 09/06/2016   Procedure: LEFT HIP INTRAMEDULLARY (IM) NAIL;  Surgeon: Renette Butters, MD;  Location: [facility];  Service: Orthopedics;  Laterality: Left;  . THYROIDECTOMY, PARTIAL        Social History:      Social History   Tobacco Use  . Smoking status: Never Smoker  . Smokeless tobacco: Never Used  Substance Use Topics  . Alcohol use: Yes    Comment: occasionally        Family History :     Family History  Problem Relation Age of Onset  . Pneumonia Father   . Coronary artery disease Mother   . Stroke Mother   . Colon cancer Mother 46  . Thalassemia Sister       Home Medications:   Prior to Admission medications   Medication Sig  Start Date End Date Taking? Authorizing Provider  alendronate (FOSAMAX) 70 MG tablet Take 70 mg by mouth once a week. Take with a full glass of water on an empty stomach. Take on Wednesdays   Yes [provider]  calcium citrate-vitamin D 500-400 MG-UNIT chewable tablet Chew by mouth 2 (two) times daily.   Yes [provider]  Cranberry 450 MG TABS Take 450 mg by mouth 2 (two) times daily.   Yes [provider]  cycloSPORINE (RESTASIS) 0.05 % ophthalmic emulsion Place 1 drop into both eyes 2 (two) times daily.   Yes [provider]  docusate sodium (COLACE) 100 MG capsule Take 100 mg by mouth 2 (two) times daily.   Yes [provider]  donepezil (ARICEPT) 5 MG tablet Take 5 mg by mouth at bedtime.   Yes [provider]  fish oil-omega-3 fatty acids 1000 MG capsule Take 2 g by mouth daily.     Yes [provider]  iron polysaccharides (NIFEREX) 150 MG capsule Take 150 mg by mouth 2 (two) times daily.   Yes [provider]  lidocaine (LIDODERM) 5 % Place 1 patch onto the skin  daily. Remove & Discard patch within 12 hours or as directed by MD Patient taking differently: Place 1 patch onto the skin daily. Remove & Discard patch within 12 hours or as directed by MD. *to be applied to right side 11/12/19  Yes Nuala Alpha A, PA-C  meclizine (ANTIVERT) 12.5 MG tablet Take 12.5 mg by mouth every 6 (six) hours as needed for dizziness.   Yes [provider]  memantine (NAMENDA) 10 MG tablet Take 10 mg by mouth 2 (two) times daily.   Yes [provider]  metoprolol tartrate (LOPRESSOR) 25 MG tablet Take 12.5 mg by mouth daily.   Yes [provider]  nortriptyline (PAMELOR) 25 MG capsule Take 25 mg by mouth at bedtime.   Yes [provider]     Allergies:     Allergies  Allergen Reactions  . Bee Venom     Listed per Nursing Center-No reaction is noted  . Carbapenems     Listed per Nursing Center-No reaction is noted  . Cephalosporins     Listed per Nursing Center-No reaction is noted  . Procaine Hcl Other (See Comments)    Numbness/jerkiness  . Sulfonamide Derivatives Other (See Comments)    hypertension  . Penicillins Rash    Has patient had a PCN reaction causing immediate rash, facial/tongue/throat swelling, SOB or lightheadedness with hypotension:YES Has patient had a PCN reaction causing severe rash involving mucus membranes or skin necrosis:NO Has patient had a PCN reaction that required hospitalizationNO Has patient had a PCN reaction occurring within the last 10 years: NO If all of the above answers are "NO", then may proceed with Cephalosporin use.     Physical Exam:   Vitals  Blood pressure (!) 177/69, pulse 81, temperature 99.2 F (37.3 C), temperature source Oral, resp. rate (!) 23, height 5\' 2"  (1.575 m), weight 68 kg, SpO2 93 %.  1.  General: Appears in no acute distress  2. Psychiatric: Alert, oriented x4, intact insight and judgment  3. Neurologic: Cranial nerves II through XII grossly intact, no  focal deficit noted at this time  4. HEENMT:  Atraumatic normocephalic, extraocular muscles are intact  5. Respiratory : Clear to auscultation bilaterally, no wheezing or crackles auscultated  6. Cardiovascular : S1-S2, regular, no murmur auscultated  7. Gastrointestinal:  Abdomen is  soft, nontender, no organomegaly      Data Review:    CBC Recent Labs  Lab 11/12/19 2004 11/15/19 1758  WBC 11.9* 13.6*  HGB 11.4* 10.4*  HCT 37.5 32.2*  PLT 272 230  MCV 67.2* 69.8*  MCH 20.4* 22.6*  MCHC 30.4 32.3  RDW 18.0* 18.8*  LYMPHSABS 2.9 1.3  MONOABS 1.3* 1.4*  EOSABS 0.1 0.0  BASOSABS 0.0 0.1   ------------------------------------------------------------------------------------------------------------------  Results for orders placed or performed during the hospital encounter of 11/15/19 (from the past 48 hour(s))  CBC with Differential     Status: Abnormal   Collection Time: 11/15/19  5:58 PM  Result Value Ref Range   WBC 13.6 (H) 4.0 - 10.5 K/uL   RBC 4.61 3.87 - 5.11 MIL/uL   Hemoglobin 10.4 (L) 12.0 - 15.0 g/dL   HCT 32.2 (L) 36.0 - 46.0 %   MCV 69.8 (L) 80.0 - 100.0 fL   MCH 22.6 (L) 26.0 - 34.0 pg   MCHC 32.3 30.0 - 36.0 g/dL   RDW 18.8 (H) 11.5 - 15.5 %   Platelets 230 150 - 400 K/uL   nRBC 0.2 0.0 - 0.2 %   Neutrophils Relative % 78 %   Neutro Abs 10.8 (H) 1.7 - 7.7 K/uL   Lymphocytes Relative 10 %   Lymphs Abs 1.3 0.7 - 4.0 K/uL   Monocytes Relative 11 %   Monocytes Absolute 1.4 (H) 0.1 - 1.0 K/uL   Eosinophils Relative 0 %   Eosinophils Absolute 0.0 0.0 - 0.5 K/uL   Basophils Relative 0 %   Basophils Absolute 0.1 0.0 - 0.1 K/uL   Immature Granulocytes 1 %   Abs Immature Granulocytes 0.07 0.00 - 0.07 K/uL    Comment: Performed at [facility], [address], [place], [place] [address]  Comprehensive metabolic panel     Status: Abnormal   Collection Time: 11/15/19  5:58 PM  Result Value Ref Range   Sodium 138 135 - 145 mmol/L   Potassium 3.7 3.5 -  5.1 mmol/L   Chloride 103 98 - 111 mmol/L   CO2 25 22 - 32 mmol/L   Glucose, Bld 166 (H) 70 - 99 mg/dL    Comment: Glucose reference range applies only to samples taken after fasting for at least 8 hours.   BUN 22 8 - 23 mg/dL   Creatinine, Ser 0.70 0.44 - 1.00 mg/dL   Calcium 8.7 (L) 8.9 - 10.3 mg/dL   Total Protein 6.1 (L) 6.5 - 8.1 g/dL   Albumin 3.8 3.5 - 5.0 g/dL   AST 34 15 - 41 U/L   ALT 24 0 - 44 U/L   Alkaline Phosphatase 52 38 - 126 U/L   Total Bilirubin 1.5 (H) 0.3 - 1.2 mg/dL   GFR calc non Af Amer >60 >60 mL/min   GFR calc Af Amer >60 >60 mL/min   Anion gap 10 5 - 15    Comment: Performed at [facility], [address] [place], [place] [address]  Lipase, blood     Status: None   Collection Time: 11/15/19  5:58 PM  Result Value Ref Range   Lipase 15 11 - 51 U/L    Comment: Performed at [facility], [address], [place], [place] [address]  Troponin I (High Sensitivity)     Status: Abnormal   Collection Time: 11/15/19  5:58 PM  Result Value Ref Range   Troponin I (High Sensitivity) 1,332 (HH) <18 ng/L    Comment: CRITICAL  RESULT CALLED TO, READ BACK BY AND VERIFIED WITH: LONG,L ON 11/15/19 AT 2145 BY LOY,C (NOTE) Elevated high sensitivity troponin I (hsTnI) values and significant  changes across serial measurements may suggest ACS but many other  chronic and acute conditions are known to elevate hsTnI results.  Refer to the Links section for chest pain algorithms and additional  guidance. Performed at [facility], [address] [place], [place] [address]   Urinalysis, Routine w reflex microscopic     Status: Abnormal   Collection Time: 11/15/19  8:18 PM  Result Value Ref Range   Color, Urine AMBER (A) YELLOW    Comment: BIOCHEMICALS MAY BE AFFECTED BY COLOR   APPearance CLOUDY (A) CLEAR   Specific Gravity, Urine 1.030 1.005 - 1.030   pH 5.0 5.0 - 8.0   Glucose, UA NEGATIVE NEGATIVE mg/dL   Hgb urine dipstick NEGATIVE NEGATIVE   Bilirubin Urine NEGATIVE  NEGATIVE   Ketones, ur 20 (A) NEGATIVE mg/dL   Protein, ur 30 (A) NEGATIVE mg/dL   Nitrite NEGATIVE NEGATIVE   Leukocytes,Ua NEGATIVE NEGATIVE   RBC / HPF 0-5 0 - 5 RBC/hpf   WBC, UA 0-5 0 - 5 WBC/hpf   Bacteria, UA RARE (A) NONE SEEN   Squamous Epithelial / LPF 0-5 0 - 5   Mucus PRESENT    Hyaline Casts, UA PRESENT     Comment: Performed at [facility], [address], [place],  [address]  Respiratory Panel by RT PCR (Flu A&B, Covid) - Nasopharyngeal Swab     Status: None   Collection Time: 11/15/19  9:57 PM   Specimen: Nasopharyngeal Swab  Result Value Ref Range   SARS Coronavirus 2 by RT PCR NEGATIVE NEGATIVE    Comment: (NOTE) SARS-CoV-2 target nucleic acids are NOT DETECTED. The SARS-CoV-2 RNA is generally detectable in upper respiratoy specimens during the acute phase of infection. The lowest concentration of SARS-CoV-2 viral copies this assay can detect is 131 copies/mL. A negative result does not preclude SARS-Cov-2 infection and should not be used as the sole basis for treatment or other patient management decisions. A negative result may occur with  improper specimen collection/handling, submission of specimen other than nasopharyngeal swab, presence of viral mutation(s) within the areas targeted by this assay, and inadequate number of viral copies (<131 copies/mL). A negative result must be combined with clinical observations, patient history, and epidemiological information. The expected result is Negative. Fact Sheet for Patients:  PinkCheek.be Fact Sheet for Healthcare Providers:  GravelBags.it This test is not yet ap proved or cleared by the [place] FDA and  has been authorized for detection and/or diagnosis of SARS-CoV-2 by FDA under an Emergency Use Authorization (EUA). This EUA will remain  in effect (meaning this test can be used) for the duration of the COVID-19 declaration under Section  564(b)(1) of the Act, 21 U.S.C. section 360bbb-3(b)(1), unless the authorization is terminated or revoked sooner.    Influenza A by PCR NEGATIVE NEGATIVE   Influenza B by PCR NEGATIVE NEGATIVE    Comment: (NOTE) The Xpert Xpress SARS-CoV-2/FLU/RSV assay is intended as an aid in  the diagnosis of influenza from Nasopharyngeal swab specimens and  should not be used as a sole basis for treatment. Nasal washings and  aspirates are unacceptable for Xpert Xpress SARS-CoV-2/FLU/RSV  testing. Fact Sheet for Patients: PinkCheek.be Fact Sheet for Healthcare Providers: GravelBags.it This test is not yet approved or cleared by the [place] FDA and  has been authorized for detection and/or  diagnosis of SARS-CoV-2 by  FDA under an Emergency Use Authorization (EUA). This EUA will remain  in effect (meaning this test can be used) for the duration of the  Covid-19 declaration under Section 564(b)(1) of the Act, 21  U.S.C. section 360bbb-3(b)(1), unless the authorization is  terminated or revoked. Performed at [facility], [address] [place], [place] [address]   Troponin I (High Sensitivity)     Status: Abnormal   Collection Time: 11/15/19 10:32 PM  Result Value Ref Range   Troponin I (High Sensitivity) 1,433 (HH) <18 ng/L    Comment: CRITICAL VALUE NOTED.  VALUE IS CONSISTENT WITH PREVIOUSLY REPORTED AND CALLED VALUE. (NOTE) Elevated high sensitivity troponin I (hsTnI) values and significant  changes across serial measurements may suggest ACS but many other  chronic and acute conditions are known to elevate hsTnI results.  Refer to the Links section for chest pain algorithms and additional  guidance. Performed at [facility], [address], [place], [place] [address]     Chemistries  Recent Labs  Lab 11/12/19 2004 11/15/19 1758  NA 141 138  K 3.5 3.7  CL 103 103  CO2 28 25  GLUCOSE 105* 166*  BUN 13 22  CREATININE  0.60 0.70  CALCIUM 8.8* 8.7*  AST  --  34  ALT  --  24  ALKPHOS  --  52  BILITOT  --  1.5*   ------------------------------------------------------------------------------------------------------------------  ------------------------------------------------------------------------------------------------------------------ GFR: Estimated Creatinine Clearance: 43.1 mL/min (by C-G formula based on SCr of 0.7 mg/dL). Liver Function Tests: Recent Labs  Lab 11/15/19 1758  AST 34  ALT 24  ALKPHOS 52  BILITOT 1.5*  PROT 6.1*  ALBUMIN 3.8   Recent Labs  Lab 11/15/19 1758  LIPASE 15   No results for input(s): AMMONIA in the last 168 hours. Coagulation Profile: No results for input(s): INR, PROTIME in the last 168 hours. Cardiac Enzymes: No results for input(s): CKTOTAL, CKMB, CKMBINDEX, TROPONINI in the last 168 hours. BNP (last 3 results) No results for input(s): PROBNP in the last 8760 hours. HbA1C: No results for input(s): HGBA1C in the last 72 hours. CBG: No results for input(s): GLUCAP in the last 168 hours. Lipid Profile: No results for input(s): CHOL, HDL, LDLCALC, TRIG, CHOLHDL, LDLDIRECT in the last 72 hours. Thyroid Function Tests: No results for input(s): TSH, T4TOTAL, FREET4, T3FREE, THYROIDAB in the last 72 hours. Anemia Panel: No results for input(s): VITAMINB12, FOLATE, FERRITIN, TIBC, IRON, RETICCTPCT in the last 72 hours.  --------------------------------------------------------------------------------------------------------------- Urine analysis:    Component Value Date/Time   COLORURINE AMBER (A) 11/15/2019 2018   APPEARANCEUR CLOUDY (A) 11/15/2019 2018   LABSPEC 1.030 11/15/2019 2018   PHURINE 5.0 11/15/2019 2018   GLUCOSEU NEGATIVE 11/15/2019 2018   HGBUR NEGATIVE 11/15/2019 2018   BILIRUBINUR NEGATIVE 11/15/2019 2018   KETONESUR 20 (A) 11/15/2019 2018   PROTEINUR 30 (A) 11/15/2019 2018   UROBILINOGEN 0.2 01/18/2011 2222   NITRITE NEGATIVE  11/15/2019 2018   LEUKOCYTESUR NEGATIVE 11/15/2019 2018      Imaging Results:    CT ABDOMEN PELVIS W CONTRAST  Result Date: 11/15/2019 CLINICAL DATA:  Right upper quadrant abdominal pain. Nausea and vomiting. History of tubular adenoma of the colon EXAM: CT ABDOMEN AND PELVIS WITH CONTRAST TECHNIQUE: Multidetector CT imaging of the abdomen and pelvis was performed using the standard protocol following bolus administration of intravenous contrast. CONTRAST:  122mL OMNIPAQUE IOHEXOL 300 MG/ML  SOLN COMPARISON:  January 15, 2008 FINDINGS: Lower chest: There are trace bilateral  pleural effusions, right greater than left.There is significant cardiomegaly. Hepatobiliary: The liver is normal. Status post cholecystectomy.There is no biliary ductal dilation. Pancreas: Normal contours without ductal dilatation. No peripancreatic fluid collection. Spleen: Unremarkable. Adrenals/Urinary Tract: --Adrenal glands: Unremarkable. --Right kidney/ureter: No hydronephrosis or radiopaque kidney stones. --Left kidney/ureter: No hydronephrosis or radiopaque kidney stones. --Urinary bladder: Unremarkable. Stomach/Bowel: --Stomach/Duodenum: No hiatal hernia or other gastric abnormality. Normal duodenal course and caliber. --Small bowel: Unremarkable. --Colon: Rectosigmoid diverticulosis without acute inflammation. --Appendix: Normal. Vascular/Lymphatic: Atherosclerotic calcification is present within the non-aneurysmal abdominal aorta, without hemodynamically significant stenosis. 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 The IMA is patent. --No retroperitoneal lymphadenopathy. --No mesenteric lymphadenopathy. --No pelvic or inguinal lymphadenopathy. Reproductive: Unremarkable Other: No ascites or free air. The abdominal wall is normal. Musculoskeletal. There is a chronic appearing L1 compression fracture with mild retropulsion. There is no new acute displaced fracture. The patient is status post prior  intramedullary nail placement through the left femur. IMPRESSION: 1. No acute abdominopelvic abnormality. 2. Rectosigmoid diverticulosis without acute inflammation. 3. Cardiomegaly and trace bilateral pleural effusions, right greater than left. Aortic Atherosclerosis (ICD10-I70.0). Electronically Signed   By: Constance Holster M.D.   On: 11/15/2019 19:58    My personal review of EKG: Normal sinus rhythm, nonspecific ST changes   Assessment & Plan:    Active Problems:   Chest pain    1. Chest pain-patient presenting with chest pain with radiation to upper back, concern for aortic dissection.  Will obtain stat CTA chest to rule out aortic dissection.  Patient already started on IV heparin, will hold IV heparin at this time till CT chest rules out aortic dissection. 2. ?  NSTEMI versus demand ischemia-patient presented with A. fib with RVR 3 days ago and also has hypertensive urgency.  She may have demand ischemia versus NSTEMI.  Will cycle troponin every 6 hours x3, start heparin protocol if CTA chest is negative for aortic dissection.  Will consult cardiology in a.m. 3. Dementia-continue Aricept, Namenda 4. Paroxysmal atrial fibrillation-currently in normal sinus rhythm, started on heparin as above.  Heart rate is controlled.  Continue metoprolol 12.5 mg p.o. daily.   DVT Prophylaxis-   full dose heparin  AM Labs Ordered, also please review Full Orders  Family Communication: Admission, patients condition and plan of care including tests being ordered have been discussed with the patient and her son at bedside who indicate understanding and agree with the plan and Code Status.  Code Status: DNR  Admission status: Inpatient :The appropriate admission status for this patient is INPATIENT. Inpatient status is judged to be reasonable and necessary in order to provide the required intensity of service to ensure the patient's safety. The patient's presenting symptoms, physical exam findings, and  initial radiographic and laboratory data in the context of their chronic comorbidities is felt to place them at high risk for further clinical deterioration. Furthermore, it is not anticipated that the patient will be medically stable for discharge from the hospital within 2 midnights of admission. The following factors support the admission status of inpatient.     The patient's presenting symptoms include chest pain. The worrisome physical exam findings include hypertension. The initial radiographic and laboratory data are worrisome because of elevated troponin. The chronic co-morbidities include atrial fibrillation.       * I certify that at the point of admission it is my clinical judgment that the patient will require inpatient hospital care spanning beyond 2 midnights from  the point of admission due to high intensity of service, high risk for further deterioration and high frequency of surveillance required.*  Time spent in minutes : 60 minutes   Jamee Keach S Tayvon Culley M.D

## 2019-11-15 NOTE — ED Provider Notes (Signed)
[facility] EMERGENCY DEPARTMENT Provider Note   CSN: [id number] Arrival date & time: 11/15/19  1639     History Chief Complaint  Patient presents with  . Emesis   Level 5 caveat due to dementia  [name] is a 86 y.o. female with history of A. fib, dementia, diabetes mellitus, hypertension, hyperlipidemia, hyperthyroidism, osteoporosis presents brought in by EMS from [facility] for evaluation.  History and physical examination limited due to patient's history of dementia.  She reports no complaints and she does not know why she is here.  She denies chest pain, abdominal pain, nausea, vomiting, fevers, shortness of breath or loss of consciousness.  She was seen and evaluated 3 days ago in the ED after a fall.  Work-up at that time was reassuring and she was found to be stable for discharge back to her facility.  She is alert and oriented to person and place only which appears to be baseline.  I spoke with patient's nurse Enis Gash on the phone.  She reports that today the patient had decreased appetite, ate very little at breakfast and missed lunch entirely.  She has been more tired.  Today around 4 PM she had 2 episodes of nonbloody nonbilious emesis.  She is scheduled for an ultrasound of the right upper quadrant tomorrow due to complaint of right upper quadrant pain radiating to the shoulder and back.  She has not had any fevers, diarrhea, or urinary symptoms.  She states that the patient also did not have a fall when she was evaluated a few days ago.   The history is provided by the patient and medical records. The history is limited by the condition of the patient.       Past Medical History:  Diagnosis Date  . Atrial fibrillation ([place])   . Dementia ([place])   . Diabetes mellitus   . Diverticulitis 8/09, 3/10   abscess in 2009(medical tx)  . HTN (hypertension)   . Hypercholesteremia   . Hyperthyroidism    s/p radiation, surgery  . IDA (iron deficiency  anemia)   . Osteoporosis   . Postoperative anemia due to acute blood loss 09/08/2016  . Thalassemia   . Tubular adenoma of colon 12/15/08   Colonosocpy Dr Tonita Cong removed cecum    Patient Active Problem List   Diagnosis Date Noted  . Postoperative anemia due to acute blood loss 09/08/2016  . Closed intertrochanteric fracture of left hip, initial encounter ([place]) 09/05/2016  . Dementia ([place]) 09/05/2016  . Abdominal pain 01/10/2011  . DERANGEMENT OF POSTERIOR HORN OF MEDIAL MENISCUS 09/25/2010  . CLOSED DISLOCATION OF DISTAL RADIOULNAR 03/28/2010  . COLONIC POLYPS, ADENOMATOUS, HX OF 07/31/2009  . PAROXYSMAL ATRIAL FIBRILLATION 10/26/2008  . Deficiency anemia 10/25/2008  . OTHER THALASSEMIA 10/25/2008  . DIVERTICULITIS, COLON 10/25/2008  . Diabetes mellitus due to underlying condition, controlled ([place]) 10/24/2008  . HYPERCHOLESTEROLEMIA 10/24/2008  . Iron deficiency anemia 10/24/2008  . Essential hypertension 10/24/2008  . Osteoporosis 10/24/2008  . HYPERTHYROIDISM, HX OF 10/24/2008    Past Surgical History:  Procedure Laterality Date  . CESAREAN SECTION     x2  . CHOLECYSTECTOMY    . FOOT SURGERY     bilat  . INTRAMEDULLARY (IM) NAIL INTERTROCHANTERIC Left 09/06/2016   Procedure: LEFT HIP INTRAMEDULLARY (IM) NAIL;  Surgeon: Renette Butters, MD;  Location: [facility];  Service: Orthopedics;  Laterality: Left;  . THYROIDECTOMY, PARTIAL       OB History   No obstetric history  on file.     Family History  Problem Relation Age of Onset  . Pneumonia Father   . Coronary artery disease Mother   . Stroke Mother   . Colon cancer Mother 27  . Thalassemia Sister     Social History   Tobacco Use  . Smoking status: Never Smoker  . Smokeless tobacco: Never Used  Substance Use Topics  . Alcohol use: Yes    Comment: occasionally   . Drug use: No    Home Medications Prior to Admission medications   Medication Sig Start Date End Date Taking? Authorizing Provider  alendronate  (FOSAMAX) 70 MG tablet Take 70 mg by mouth once a week. Take with a full glass of water on an empty stomach. Take on Wednesdays   Yes [provider]  calcium citrate-vitamin D 500-400 MG-UNIT chewable tablet Chew by mouth 2 (two) times daily.   Yes [provider]  Cranberry 450 MG TABS Take 450 mg by mouth 2 (two) times daily.   Yes [provider]  cycloSPORINE (RESTASIS) 0.05 % ophthalmic emulsion Place 1 drop into both eyes 2 (two) times daily.   Yes [provider]  docusate sodium (COLACE) 100 MG capsule Take 100 mg by mouth 2 (two) times daily.   Yes [provider]  donepezil (ARICEPT) 5 MG tablet Take 5 mg by mouth at bedtime.   Yes [provider]  fish oil-omega-3 fatty acids 1000 MG capsule Take 2 g by mouth daily.     Yes [provider]  iron polysaccharides (NIFEREX) 150 MG capsule Take 150 mg by mouth 2 (two) times daily.   Yes [provider]  lidocaine (LIDODERM) 5 % Place 1 patch onto the skin daily. Remove & Discard patch within 12 hours or as directed by MD Patient taking differently: Place 1 patch onto the skin daily. Remove & Discard patch within 12 hours or as directed by MD. *to be applied to right side 11/12/19  Yes Nuala Alpha A, PA-C  meclizine (ANTIVERT) 12.5 MG tablet Take 12.5 mg by mouth every 6 (six) hours as needed for dizziness.   Yes [provider]  memantine (NAMENDA) 10 MG tablet Take 10 mg by mouth 2 (two) times daily.   Yes [provider]  metoprolol tartrate (LOPRESSOR) 25 MG tablet Take 12.5 mg by mouth daily.   Yes [provider]  nortriptyline (PAMELOR) 25 MG capsule Take 25 mg by mouth at bedtime.   Yes [provider]    Allergies    Bee venom, Carbapenems, Cephalosporins, Procaine hcl, Sulfonamide derivatives, and Penicillins  Review of Systems   Review of Systems  Unable to perform ROS: Dementia    Physical Exam Updated Vital  Signs BP (!) 186/92   Pulse 84   Temp 99.2 F (37.3 C) (Oral)   Resp (!) 23   Ht 5\' 2"  (1.575 m)   Wt 68 kg   SpO2 94%   BMI 27.44 kg/m   Physical Exam Vitals and nursing note reviewed.  Constitutional:      General: She is not in acute distress.    Appearance: She is well-developed.     Comments: Resting comfortably in bed  HENT:     Head: Normocephalic and atraumatic.  Eyes:     General:        Right eye: No discharge.        Left eye: No discharge.     Conjunctiva/sclera: Conjunctivae normal.  Neck:  Vascular: No JVD.     Trachea: No tracheal deviation.  Cardiovascular:     Rate and Rhythm: Normal rate and regular rhythm.     Pulses: Normal pulses.  Pulmonary:     Effort: Pulmonary effort is normal.     Breath sounds: Normal breath sounds.  Chest:     Chest wall: No tenderness.  Abdominal:     General: Bowel sounds are normal. There is no distension.     Palpations: Abdomen is soft.     Tenderness: There is no abdominal tenderness. There is no right CVA tenderness, left CVA tenderness, guarding or rebound.  Skin:    General: Skin is warm and dry.     Findings: No erythema.  Neurological:     Mental Status: She is alert.     Comments: Mildly confused.  Speech is fluent with no dysarthria or aphasia.  No facial droop noted.  Moves all extremities spontaneously without difficulty.  Psychiatric:        Behavior: Behavior normal.     ED Results / Procedures / Treatments   Labs (all labs ordered are listed, but only abnormal results are displayed) Labs Reviewed  CBC WITH DIFFERENTIAL/PLATELET - Abnormal; Notable for the following components:      Result Value   WBC 13.6 (*)    Hemoglobin 10.4 (*)    HCT 32.2 (*)    MCV 69.8 (*)    MCH 22.6 (*)    RDW 18.8 (*)    Neutro Abs 10.8 (*)    Monocytes Absolute 1.4 (*)    All other components within normal limits  COMPREHENSIVE METABOLIC PANEL - Abnormal; Notable for the following components:   Glucose, Bld  166 (*)    Calcium 8.7 (*)    Total Protein 6.1 (*)    Total Bilirubin 1.5 (*)    All other components within normal limits  URINALYSIS, ROUTINE W REFLEX MICROSCOPIC - Abnormal; Notable for the following components:   Color, Urine AMBER (*)    APPearance CLOUDY (*)    Ketones, ur 20 (*)    Protein, ur 30 (*)    Bacteria, UA RARE (*)    All other components within normal limits  TROPONIN I (HIGH SENSITIVITY) - Abnormal; Notable for the following components:   Troponin I (High Sensitivity) 1,332 (*)    All other components within normal limits  URINE CULTURE  RESPIRATORY PANEL BY RT PCR (FLU A&B, COVID)  LIPASE, BLOOD  TROPONIN I (HIGH SENSITIVITY)    ED ECG REPORT   Date: 11/15/2019  Rate: 85  Rhythm: normal sinus rhythm  QRS Axis: normal  Intervals: normal  ST/T Wave abnormalities: early repolarization  Conduction Disutrbances:none  Narrative Interpretation:   Old EKG Reviewed: unchanged  I have personally reviewed the EKG tracing and agree with the computerized printout as noted.   Radiology CT ABDOMEN PELVIS W CONTRAST  Result Date: 11/15/2019 CLINICAL DATA:  Right upper quadrant abdominal pain. Nausea and vomiting. History of tubular adenoma of the colon EXAM: CT ABDOMEN AND PELVIS WITH CONTRAST TECHNIQUE: Multidetector CT imaging of the abdomen and pelvis was performed using the standard protocol following bolus administration of intravenous contrast. CONTRAST:  133mL OMNIPAQUE IOHEXOL 300 MG/ML  SOLN COMPARISON:  January 15, 2008 FINDINGS: Lower chest: There are trace bilateral pleural effusions, right greater than left.There is significant cardiomegaly. Hepatobiliary: The liver is normal. Status post cholecystectomy.There is no biliary ductal dilation. Pancreas: Normal contours without ductal dilatation. No peripancreatic fluid collection. Spleen:  Unremarkable. Adrenals/Urinary Tract: --Adrenal glands: Unremarkable. --Right kidney/ureter: No hydronephrosis or radiopaque kidney  stones. --Left kidney/ureter: No hydronephrosis or radiopaque kidney stones. --Urinary bladder: Unremarkable. Stomach/Bowel: --Stomach/Duodenum: No hiatal hernia or other gastric abnormality. Normal duodenal course and caliber. --Small bowel: Unremarkable. --Colon: Rectosigmoid diverticulosis without acute inflammation. --Appendix: Normal. Vascular/Lymphatic: Atherosclerotic calcification is present within the non-aneurysmal abdominal aorta, without hemodynamically significant stenosis. 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 The IMA is patent. --No retroperitoneal lymphadenopathy. --No mesenteric lymphadenopathy. --No pelvic or inguinal lymphadenopathy. Reproductive: Unremarkable Other: No ascites or free air. The abdominal wall is normal. Musculoskeletal. There is a chronic appearing L1 compression fracture with mild retropulsion. There is no new acute displaced fracture. The patient is status post prior intramedullary nail placement through the left femur. IMPRESSION: 1. No acute abdominopelvic abnormality. 2. Rectosigmoid diverticulosis without acute inflammation. 3. Cardiomegaly and trace bilateral pleural effusions, right greater than left. Aortic Atherosclerosis (ICD10-I70.0). Electronically Signed   By: Constance Holster M.D.   On: 11/15/2019 19:58    Procedures .Critical Care Performed by: Renita Papa, PA-C Authorized by: Renita Papa, PA-C   Critical care provider statement:    Critical care time (minutes):  45   Critical care was necessary to treat or prevent imminent or life-threatening deterioration of the following conditions:  Circulatory failure and cardiac failure   Critical care was time spent personally by me on the following activities:  Discussions with consultants, evaluation of patient's response to treatment, examination of patient, ordering and performing treatments and interventions, ordering and review of laboratory studies, ordering and review  of radiographic studies, pulse oximetry, re-evaluation of patient's condition, obtaining history from patient or surrogate and review of old charts   (including critical care time)  Medications Ordered in ED Medications  iohexol (OMNIPAQUE) 300 MG/ML solution 100 mL (100 mLs Intravenous Contrast Given 11/15/19 1939)  acetaminophen (TYLENOL) tablet 650 mg (650 mg Oral Given 11/15/19 2037)  metoprolol tartrate (LOPRESSOR) tablet 12.5 mg (12.5 mg Oral Given 11/15/19 2158)    ED Course  I have reviewed the triage vital signs and the nursing notes.  Pertinent labs & imaging results that were available during my care of the patient were reviewed by me and considered in my medical decision making (see chart for details).    MDM Rules/Calculators/A&P                      Patient presenting sent from facility for evaluation of decreased appetite, vomiting, complaint of pain.  Due to her dementia it is difficult to ascertain exactly when she began to feel the pain in where she is feeling pain.  On my initial assessment she denied any pain.  Her abdomen is soft and nontender.  On reassessment she began complaining of pain between her shoulder blades.  She is afebrile, persistently hypertensive in the ED but likely has not had her metoprolol due to her vomiting.  She is also mildly tachypneic, SPO2 saturations are borderline low around 91 to 94% on room air.  Lab work reviewed and interpreted by myself shows a worsening leukocytosis of 13.6, mildly worsening anemia 10.4, no renal insufficiency, no metabolic derangements.  UA shows signs of mild dehydration but less concerning for UTI or nephrolithiasis.  CT scan of the abdomen and pelvis obtained shows no acute abdominopelvic abnormality.  She does have cardiomegaly and trace bilateral pleural effusions right greater than left on imaging of the lung bases.  Her EKG today shows left ventricular hypertrophy with repolarization abnormalities.  Troponin is  elevated today at 1332.  We will continue to trend this.  Concern for NSTEMI versus other causes of elevated troponin such as demand ischemia secondary to A. fib with RVR (though patient is currently in normal sinus at this time, she has a history of paroxysmal A. fib), PE, CHF.  10:10PM  spoke with Dr. Darrick Meigs with [facility] hospitalist service who requests cardiology consultation for further recommendations.  Given that she is a DNR she would likely not be a great candidate for cardiac catheterization.  10:40PM CONSULT: Spoke with Dr. Paticia Stack with cardiology, reviewed patient's work-up thus far.  He agrees to treat conservatively at this time.  He recommends giving heparin and admitting to the hospital for further evaluation and management.  He notes that it would be reasonable for the patient to be seen by the cardiology service at [facility] in the morning but alternatively the patient could be transferred to [facility] where the cardiology service there can see the patient as well.  10:48PM Spoke with Dr. Darrick Meigs with [facility] hospitalist service who agrees to assume care of patient and bring her into the hospital for further evaluation and management.   Final Clinical Impression(s) / ED Diagnoses Final diagnoses:  NSTEMI (non-ST elevated myocardial infarction) ([place])  Pleural effusion, bilateral    Rx / DC Orders ED Discharge Orders    None       Debroah Baller 11/15/19 2258    Truddie Hidden, MD 11/15/19 2321

## 2019-11-16 ENCOUNTER — Encounter (HOSPITAL_COMMUNITY): Payer: Self-pay | Admitting: Family Medicine

## 2019-11-16 ENCOUNTER — Inpatient Hospital Stay (HOSPITAL_COMMUNITY): Payer: Medicare HMO

## 2019-11-16 DIAGNOSIS — I10 Essential (primary) hypertension: Secondary | ICD-10-CM

## 2019-11-16 DIAGNOSIS — I351 Nonrheumatic aortic (valve) insufficiency: Secondary | ICD-10-CM | POA: Diagnosis not present

## 2019-11-16 DIAGNOSIS — I712 Thoracic aortic aneurysm, without rupture: Secondary | ICD-10-CM

## 2019-11-16 DIAGNOSIS — I214 Non-ST elevation (NSTEMI) myocardial infarction: Secondary | ICD-10-CM | POA: Diagnosis not present

## 2019-11-16 DIAGNOSIS — I340 Nonrheumatic mitral (valve) insufficiency: Secondary | ICD-10-CM | POA: Diagnosis not present

## 2019-11-16 DIAGNOSIS — I480 Paroxysmal atrial fibrillation: Secondary | ICD-10-CM | POA: Diagnosis not present

## 2019-11-16 DIAGNOSIS — I429 Cardiomyopathy, unspecified: Secondary | ICD-10-CM | POA: Diagnosis not present

## 2019-11-16 DIAGNOSIS — F0390 Unspecified dementia without behavioral disturbance: Secondary | ICD-10-CM

## 2019-11-16 DIAGNOSIS — E785 Hyperlipidemia, unspecified: Secondary | ICD-10-CM

## 2019-11-16 DIAGNOSIS — E119 Type 2 diabetes mellitus without complications: Secondary | ICD-10-CM

## 2019-11-16 LAB — HEPARIN LEVEL (UNFRACTIONATED)
Heparin Unfractionated: <0.1 IU/mL — ABNORMAL LOW (ref 0.30–0.70)
Heparin Unfractionated: 0.13 IU/mL — ABNORMAL LOW (ref 0.30–0.70)

## 2019-11-16 LAB — COMPREHENSIVE METABOLIC PANEL
ALT: 26 U/L (ref 0–44)
AST: 36 U/L (ref 15–41)
Albumin: 3.7 g/dL (ref 3.5–5.0)
Alkaline Phosphatase: 51 U/L (ref 38–126)
Anion gap: 11 (ref 5–15)
BUN: 19 mg/dL (ref 8–23)
CO2: 25 mmol/L (ref 22–32)
Calcium: 8.5 mg/dL — ABNORMAL LOW (ref 8.9–10.3)
Chloride: 101 mmol/L (ref 98–111)
Creatinine, Ser: 0.59 mg/dL (ref 0.44–1.00)
GFR calc Af Amer: >60 mL/min (ref >60)
GFR calc non Af Amer: >60 mL/min (ref >60)
Glucose, Bld: 167 mg/dL — ABNORMAL HIGH (ref 70–99)
Potassium: 3.5 mmol/L (ref 3.5–5.1)
Sodium: 137 mmol/L (ref 135–145)
Total Bilirubin: 1.6 mg/dL — ABNORMAL HIGH (ref 0.3–1.2)
Total Protein: 6.4 g/dL — ABNORMAL LOW (ref 6.5–8.1)

## 2019-11-16 LAB — CBC
HCT: 28.2 % — ABNORMAL LOW (ref 36.0–46.0)
Hemoglobin: 9.8 g/dL — ABNORMAL LOW (ref 12.0–15.0)
MCH: 26.1 pg (ref 26.0–34.0)
MCHC: 34.8 g/dL (ref 30.0–36.0)
MCV: 75.2 fL — ABNORMAL LOW (ref 80.0–100.0)
Platelets: 222 10*3/uL (ref 150–400)
RBC: 3.75 MIL/uL — ABNORMAL LOW (ref 3.87–5.11)
RDW: 22.5 % — ABNORMAL HIGH (ref 11.5–15.5)
WBC: 14 10*3/uL — ABNORMAL HIGH (ref 4.0–10.5)
nRBC: 0.1 % (ref 0.0–0.2)

## 2019-11-16 LAB — LIPID PANEL
Cholesterol: 203 mg/dL — ABNORMAL HIGH (ref 0–200)
HDL: 53 mg/dL (ref >40)
LDL Cholesterol: 139 mg/dL — ABNORMAL HIGH (ref 0–99)
Total CHOL/HDL Ratio: 3.8 RATIO
Triglycerides: 56 mg/dL (ref <150)
VLDL: 11 mg/dL (ref 0–40)

## 2019-11-16 LAB — BLOOD GAS, ARTERIAL
Acid-Base Excess: 4.6 mmol/L — ABNORMAL HIGH (ref 0.0–2.0)
Bicarbonate: 28.5 mmol/L — ABNORMAL HIGH (ref 20.0–28.0)
FIO2: 26
O2 Saturation: 95 %
Patient temperature: 36.7
pCO2 arterial: 40.9 mmHg (ref 32.0–48.0)
pH, Arterial: 7.456 — ABNORMAL HIGH (ref 7.350–7.450)
pO2, Arterial: 76.3 mmHg — ABNORMAL LOW (ref 83.0–108.0)

## 2019-11-16 LAB — ECHOCARDIOGRAM COMPLETE
Height: 62 in
Weight: 2400 oz

## 2019-11-16 LAB — MRSA PCR SCREENING: MRSA by PCR: NEGATIVE

## 2019-11-16 LAB — TROPONIN I (HIGH SENSITIVITY): Troponin I (High Sensitivity): 1403 ng/L (ref <18)

## 2019-11-16 MED ORDER — SPIRONOLACTONE 25 MG PO TABS
12.5000 mg | ORAL_TABLET | Freq: Every day | ORAL | Status: DC
  Administered 2019-11-16 – 2019-11-18 (×3): 12.5 mg via ORAL
  Filled 2019-11-16 (×2): qty 0.5
  Filled 2019-11-16 (×2): qty 1
  Filled 2019-11-16 (×2): qty 0.5
  Filled 2019-11-16: qty 1

## 2019-11-16 MED ORDER — SODIUM CHLORIDE 0.9% FLUSH
3.0000 mL | Freq: Two times a day (BID) | INTRAVENOUS | Status: DC
  Administered 2019-11-16 – 2019-11-18 (×5): 3 mL via INTRAVENOUS

## 2019-11-16 MED ORDER — DOCUSATE SODIUM 100 MG PO CAPS
100.0000 mg | ORAL_CAPSULE | Freq: Two times a day (BID) | ORAL | Status: DC
  Administered 2019-11-16 – 2019-11-18 (×6): 100 mg via ORAL
  Filled 2019-11-16 (×6): qty 1

## 2019-11-16 MED ORDER — MORPHINE SULFATE (PF) 2 MG/ML IV SOLN
1.0000 mg | INTRAVENOUS | Status: DC | PRN
  Administered 2019-11-16 (×2): 1 mg via INTRAVENOUS
  Filled 2019-11-16 (×2): qty 1

## 2019-11-16 MED ORDER — CYCLOSPORINE 0.05 % OP EMUL
1.0000 [drp] | Freq: Two times a day (BID) | OPHTHALMIC | Status: DC
  Administered 2019-11-16 – 2019-11-18 (×6): 1 [drp] via OPHTHALMIC
  Filled 2019-11-16 (×6): qty 1

## 2019-11-16 MED ORDER — LOSARTAN POTASSIUM 50 MG PO TABS
25.0000 mg | ORAL_TABLET | Freq: Every day | ORAL | Status: DC
  Administered 2019-11-16 – 2019-11-17 (×2): 25 mg via ORAL
  Filled 2019-11-16 (×2): qty 1

## 2019-11-16 MED ORDER — ONDANSETRON HCL 4 MG/2ML IJ SOLN: 4.0000 mg | Freq: Four times a day (QID) | INTRAMUSCULAR | Status: DC | PRN

## 2019-11-16 MED ORDER — METOPROLOL TARTRATE 25 MG PO TABS
12.5000 mg | ORAL_TABLET | Freq: Every day | ORAL | Status: DC
  Filled 2019-11-16: qty 1

## 2019-11-16 MED ORDER — CHLORHEXIDINE GLUCONATE CLOTH 2 % EX PADS
6.0000 | MEDICATED_PAD | Freq: Every day | CUTANEOUS | Status: DC
  Administered 2019-11-16: 6 via TOPICAL

## 2019-11-16 MED ORDER — SIMVASTATIN 20 MG PO TABS
40.0000 mg | ORAL_TABLET | Freq: Every day | ORAL | Status: DC
  Administered 2019-11-16 – 2019-11-17 (×2): 40 mg via ORAL
  Filled 2019-11-16 (×2): qty 2

## 2019-11-16 MED ORDER — NORTRIPTYLINE HCL 25 MG PO CAPS
25.0000 mg | ORAL_CAPSULE | Freq: Every day | ORAL | Status: DC
  Administered 2019-11-16 – 2019-11-17 (×3): 25 mg via ORAL
  Filled 2019-11-16 (×3): qty 1

## 2019-11-16 MED ORDER — HEPARIN BOLUS VIA INFUSION
1000.0000 [IU] | Freq: Once | INTRAVENOUS | Status: AC
  Administered 2019-11-16: 1000 [IU] via INTRAVENOUS
  Filled 2019-11-16: qty 1000

## 2019-11-16 MED ORDER — DONEPEZIL HCL 5 MG PO TABS
5.0000 mg | ORAL_TABLET | Freq: Every day | ORAL | Status: DC
  Administered 2019-11-16 – 2019-11-17 (×3): 5 mg via ORAL
  Filled 2019-11-16 (×3): qty 1

## 2019-11-16 MED ORDER — SODIUM CHLORIDE 0.9% FLUSH: 3.0000 mL | INTRAVENOUS | Status: DC | PRN

## 2019-11-16 MED ORDER — HEPARIN BOLUS VIA INFUSION
1900.0000 [IU] | Freq: Once | INTRAVENOUS | Status: AC
  Administered 2019-11-16: 1900 [IU] via INTRAVENOUS
  Filled 2019-11-16: qty 1900

## 2019-11-16 MED ORDER — METOPROLOL TARTRATE 25 MG PO TABS
25.0000 mg | ORAL_TABLET | Freq: Two times a day (BID) | ORAL | Status: DC
  Administered 2019-11-16: 25 mg via ORAL

## 2019-11-16 MED ORDER — ASPIRIN EC 81 MG PO TBEC
81.0000 mg | DELAYED_RELEASE_TABLET | Freq: Every day | ORAL | Status: DC
  Administered 2019-11-16 – 2019-11-18 (×3): 81 mg via ORAL
  Filled 2019-11-16 (×3): qty 1

## 2019-11-16 MED ORDER — MECLIZINE HCL 12.5 MG PO TABS: 12.5000 mg | ORAL_TABLET | Freq: Four times a day (QID) | ORAL | Status: DC | PRN

## 2019-11-16 MED ORDER — CARVEDILOL 3.125 MG PO TABS
6.2500 mg | ORAL_TABLET | Freq: Two times a day (BID) | ORAL | Status: DC
  Administered 2019-11-16 – 2019-11-18 (×4): 6.25 mg via ORAL
  Filled 2019-11-16 (×5): qty 2

## 2019-11-16 MED ORDER — ONDANSETRON HCL 4 MG PO TABS: 4.0000 mg | ORAL_TABLET | Freq: Four times a day (QID) | ORAL | Status: DC | PRN

## 2019-11-16 MED ORDER — SODIUM CHLORIDE 0.9 % IV SOLN: 250.0000 mL | INTRAVENOUS | Status: DC | PRN

## 2019-11-16 MED ORDER — MEMANTINE HCL 10 MG PO TABS
10.0000 mg | ORAL_TABLET | Freq: Two times a day (BID) | ORAL | Status: DC
  Administered 2019-11-16 – 2019-11-18 (×6): 10 mg via ORAL
  Filled 2019-11-16 (×6): qty 1

## 2019-11-16 MED ORDER — LIDOCAINE 5 % EX PTCH
1.0000 | MEDICATED_PATCH | CUTANEOUS | Status: DC
  Administered 2019-11-16 – 2019-11-18 (×3): 1 via TRANSDERMAL
  Filled 2019-11-16 (×6): qty 1

## 2019-11-16 MED ORDER — POLYSACCHARIDE IRON COMPLEX 150 MG PO CAPS
150.0000 mg | ORAL_CAPSULE | Freq: Two times a day (BID) | ORAL | Status: DC
  Administered 2019-11-16 – 2019-11-18 (×6): 150 mg via ORAL
  Filled 2019-11-16 (×6): qty 1

## 2019-11-16 NOTE — Progress Notes (Signed)
   Progress Note  Patient Name: [name] Date of Encounter: 11/16/2019  Echocardiogram reviewed.  LVEF approximately 35 to 40% with diffuse hypokinesis.  Medical therapy will therefore be modified.  Lopressor being switched to Coreg starting at 6.25 mg twice daily.  Starting losartan 25 mg daily and Aldactone 12.5 mg daily.  Follow-up BMET in a.m.  Signed, Rozann Lesches, MD  11/16/2019, 10:44 AM

## 2019-11-16 NOTE — Progress Notes (Signed)
PROGRESS NOTE    [name]  [id number] DOB: 1928-10-27 DOA: 11/15/2019 PCP: Redmond School, MD    Brief Narrative:  86 year old female with a history of paroxysmal atrial fibrillation, hypertension, dementia who is a resident of an assisted living facility, was admitted to the hospital with chest discomfort radiating into her back.  CT chest did not show any evidence of dissection.  Abdominal CT was also unrevealing.  She was noted to have positive troponin with a peak of 1433.  No acute EKG changes.  Patient was admitted for further management of non-STEMI.   Assessment & Plan:   Active Problems:   Diabetes mellitus due to underlying condition, controlled ([place])   HYPERCHOLESTEROLEMIA   Essential hypertension   PAROXYSMAL ATRIAL FIBRILLATION   Dementia (HCC)   Chest pain   NSTEMI (non-ST elevated myocardial infarction) ([place])   Cardiomyopathy ([place])   Ascending aortic aneurysm ([place])   1. NSTEMI.  Seen by cardiology and felt to be type II MI.  No anticoagulation recommended at this time.  Continue to treat medically.  No recurrence of chest pain described.  Troponins trending down. 2. Cardiomyopathy.  EF 35 to 40% with global hypokinesis.  Likely nonischemic cardiomyopathy.  Started on medication regimen with beta-blockers, ARB, spironolactone 3. Paroxysmal atrial fibrillation.  Currently in sinus rhythm.  Heart rate is stable.  CHA2DS2-VASc score of 6.  Not on anticoagulation due to history of falls 4. Hypertension.  Blood pressures currently stable. 5. Hyperlipidemia continue statin. 6. Dementia.  Stable.  Continue Aricept and Namenda. 7. Ascending aortic aneurysm.  Currently measuring 4 cm.  Recommended annual surveillance.   DVT prophylaxis: Lovenox Code Status: DNR Family Communication: Discussed with son at the bedside Disposition Plan: Status is: Inpatient  Remains inpatient appropriate because:IV treatments appropriate due to intensity of illness or inability  to take PO   Dispo: The patient is from: ALF              Anticipated d/c is to: ALF              Anticipated d/c date is: 1 day              Patient currently is not medically stable to d/c.          Consultants:   Cardiology  Procedures:   Echo: Ejection fraction of 35 to 40% with global hypokinesis  Antimicrobials:       Subjective: Patient is seen she is sleepy.  She denies any chest pain or shortness of breath at this time.  Objective: Vitals:   11/16/19 1250 11/16/19 1252 11/16/19 1942 11/16/19 2052  BP:  (!) 157/69  (!) 142/61  Pulse:  75  74  Resp:  20  18  Temp:  99 F (37.2 C)  98.4 F (36.9 C)  TempSrc:  Oral  Oral  SpO2: (!) 87% 94% 94% 95%  Weight:      Height:        Intake/Output Summary (Last 24 hours) at 11/17/2019 0014 Last data filed at 11/16/2019 1700 Gross per 24 hour  Intake 240.13 ml  Output 400 ml  Net -159.87 ml   Filed Weights   11/15/19 1642  Weight: 68 kg    Examination:  General exam: Lying in bed, does not appear to be in distress Respiratory system: Clear to auscultation. Respiratory effort normal. Cardiovascular system: S1 & S2 heard, RRR. No JVD, murmurs, rubs, gallops or clicks. No pedal edema. Gastrointestinal system: Abdomen is nondistended,  soft and nontender. No organomegaly or masses felt. Normal bowel sounds heard. Central nervous system: No focal neurological deficits. Extremities: Symmetric 5 x 5 power. Skin: No rashes, lesions or ulcers Psychiatry: Somnolent, but wakes up to voice    Data Reviewed: I have personally reviewed following labs and imaging studies  CBC: Recent Labs  Lab 11/12/19 2004 11/15/19 1758 11/16/19 0735  WBC 11.9* 13.6* 14.0*  NEUTROABS 7.6 10.8*  --   HGB 11.4* 10.4* 9.8*  HCT 37.5 32.2* 28.2*  MCV 67.2* 69.8* 75.2*  PLT 272 230 [id number]   Basic Metabolic Panel: Recent Labs  Lab 11/12/19 2004 11/15/19 1758 11/16/19 0735  NA 141 138 137  K 3.5 3.7 3.5  CL 103 103 101   CO2 28 25 25   GLUCOSE 105* 166* 167*  BUN 13 22 19   CREATININE 0.60 0.70 0.59  CALCIUM 8.8* 8.7* 8.5*   GFR: Estimated Creatinine Clearance: 43.1 mL/min (by C-G formula based on SCr of 0.59 mg/dL). Liver Function Tests: Recent Labs  Lab 11/15/19 1758 11/16/19 0735  AST 34 36  ALT 24 26  ALKPHOS 52 51  BILITOT 1.5* 1.6*  PROT 6.1* 6.4*  ALBUMIN 3.8 3.7   Recent Labs  Lab 11/15/19 1758  LIPASE 15   No results for input(s): AMMONIA in the last 168 hours. Coagulation Profile: No results for input(s): INR, PROTIME in the last 168 hours. Cardiac Enzymes: No results for input(s): CKTOTAL, CKMB, CKMBINDEX, TROPONINI in the last 168 hours. BNP (last 3 results) No results for input(s): PROBNP in the last 8760 hours. HbA1C: No results for input(s): HGBA1C in the last 72 hours. CBG: No results for input(s): GLUCAP in the last 168 hours. Lipid Profile: Recent Labs    11/16/19 0031  CHOL 203*  HDL 53  LDLCALC 139*  TRIG 56  CHOLHDL 3.8   Thyroid Function Tests: No results for input(s): TSH, T4TOTAL, FREET4, T3FREE, THYROIDAB in the last 72 hours. Anemia Panel: No results for input(s): VITAMINB12, FOLATE, FERRITIN, TIBC, IRON, RETICCTPCT in the last 72 hours. Sepsis Labs: No results for input(s): PROCALCITON, LATICACIDVEN in the last 168 hours.  Recent Results (from the past 240 hour(s))  Respiratory Panel by RT PCR (Flu A&B, Covid) - Nasopharyngeal Swab     Status: None   Collection Time: 11/15/19  9:57 PM   Specimen: Nasopharyngeal Swab  Result Value Ref Range Status   SARS Coronavirus 2 by RT PCR NEGATIVE NEGATIVE Final    Comment: (NOTE) SARS-CoV-2 target nucleic acids are NOT DETECTED. The SARS-CoV-2 RNA is generally detectable in upper respiratoy specimens during the acute phase of infection. The lowest concentration of SARS-CoV-2 viral copies this assay can detect is 131 copies/mL. A negative result does not preclude SARS-Cov-2 infection and should not be  used as the sole basis for treatment or other patient management decisions. A negative result may occur with  improper specimen collection/handling, submission of specimen other than nasopharyngeal swab, presence of viral mutation(s) within the areas targeted by this assay, and inadequate number of viral copies (<131 copies/mL). A negative result must be combined with clinical observations, patient history, and epidemiological information. The expected result is Negative. Fact Sheet for Patients:  PinkCheek.be Fact Sheet for Healthcare Providers:  GravelBags.it This test is not yet ap proved or cleared by the [place] FDA and  has been authorized for detection and/or diagnosis of SARS-CoV-2 by FDA under an Emergency Use Authorization (EUA). This EUA will remain  in effect (meaning this test can be  used) for the duration of the COVID-19 declaration under Section 564(b)(1) of the Act, 21 U.S.C. section 360bbb-3(b)(1), unless the authorization is terminated or revoked sooner.    Influenza A by PCR NEGATIVE NEGATIVE Final   Influenza B by PCR NEGATIVE NEGATIVE Final    Comment: (NOTE) The Xpert Xpress SARS-CoV-2/FLU/RSV assay is intended as an aid in  the diagnosis of influenza from Nasopharyngeal swab specimens and  should not be used as a sole basis for treatment. Nasal washings and  aspirates are unacceptable for Xpert Xpress SARS-CoV-2/FLU/RSV  testing. Fact Sheet for Patients: PinkCheek.be Fact Sheet for Healthcare Providers: GravelBags.it This test is not yet approved or cleared by the [place] FDA and  has been authorized for detection and/or diagnosis of SARS-CoV-2 by  FDA under an Emergency Use Authorization (EUA). This EUA will remain  in effect (meaning this test can be used) for the duration of the  Covid-19 declaration under Section 564(b)(1) of the  Act, 21  U.S.C. section 360bbb-3(b)(1), unless the authorization is  terminated or revoked. Performed at [facility], [address] [place], [place] [address]   MRSA PCR Screening     Status: None   Collection Time: 11/16/19  1:52 AM   Specimen: Nasopharyngeal  Result Value Ref Range Status   MRSA by PCR NEGATIVE NEGATIVE Final    Comment:        The GeneXpert MRSA Assay (FDA approved for NASAL specimens only), is one component of a comprehensive MRSA colonization surveillance program. It is not intended to diagnose MRSA infection nor to guide or monitor treatment for MRSA infections. Performed at [facility] [address], [place], [place] [address]          Radiology Studies: CT CHEST WO CONTRAST  Result Date: 11/16/2019 CLINICAL DATA:  86 year old female with chest pain. EXAM: CT CHEST WITHOUT CONTRAST TECHNIQUE: Multidetector CT imaging of the chest was performed following the standard protocol without IV contrast. COMPARISON:  Chest radiograph dated 09/05/2016 FINDINGS: Evaluation of this exam is limited in the absence of intravenous contrast. Cardiovascular: There is mild cardiomegaly. No pericardial effusion. Coronary vascular calcifications noted. There is mild atherosclerotic calcification of the thoracic aorta. Evaluation is limited on this noncontrast CT. The ascending aorta measures up to 4 cm in diameter. The central pulmonary arteries are grossly unremarkable. Mediastinum/Nodes: No hilar or mediastinal adenopathy. The esophagus is grossly unremarkable. No mediastinal fluid collection. Status post prior right hemithyroidectomy. Lungs/Pleura: Trace bilateral pleural effusions. Focal area of subpleural scarring/thickening along the medial right upper lobe and right apex. Pneumonia is less likely but not excluded. No pneumothorax. The central airways are patent. Upper Abdomen: No acute abnormality. Musculoskeletal: Degenerative changes of the spine. No acute osseous  pathology. IMPRESSION: 1. Trace bilateral pleural effusions. 2. Focal area of subpleural scarring/thickening along the medial right upper lobe and right apex. Pneumonia is less likely but not excluded. 3. Mild cardiomegaly with coronary vascular calcifications. 4. Borderline enlarged ascending aorta measuring 4 cm in diameter. Recommend annual imaging followup by CTA or MRA. This recommendation follows 2010 ACCF/AHA/AATS/ACR/ASA/SCA/SCAI/SIR/STS/SVM Guidelines for the Diagnosis and Management of Patients with Thoracic Aortic Disease. Circulation. 2010; [id number]. Aortic aneurysm NOS (ICD10-I71.9) 5. Aortic Atherosclerosis (ICD10-I70.0). Electronically Signed   By: Anner Crete M.D.   On: 11/16/2019 01:09   CT ABDOMEN PELVIS W CONTRAST  Result Date: 11/15/2019 CLINICAL DATA:  Right upper quadrant abdominal pain. Nausea and vomiting. History of tubular adenoma of the colon EXAM: CT ABDOMEN AND PELVIS WITH CONTRAST  TECHNIQUE: Multidetector CT imaging of the abdomen and pelvis was performed using the standard protocol following bolus administration of intravenous contrast. CONTRAST:  166mL OMNIPAQUE IOHEXOL 300 MG/ML  SOLN COMPARISON:  January 15, 2008 FINDINGS: Lower chest: There are trace bilateral pleural effusions, right greater than left.There is significant cardiomegaly. Hepatobiliary: The liver is normal. Status post cholecystectomy.There is no biliary ductal dilation. Pancreas: Normal contours without ductal dilatation. No peripancreatic fluid collection. Spleen: Unremarkable. Adrenals/Urinary Tract: --Adrenal glands: Unremarkable. --Right kidney/ureter: No hydronephrosis or radiopaque kidney stones. --Left kidney/ureter: No hydronephrosis or radiopaque kidney stones. --Urinary bladder: Unremarkable. Stomach/Bowel: --Stomach/Duodenum: No hiatal hernia or other gastric abnormality. Normal duodenal course and caliber. --Small bowel: Unremarkable. --Colon: Rectosigmoid diverticulosis without acute  inflammation. --Appendix: Normal. Vascular/Lymphatic: Atherosclerotic calcification is present within the non-aneurysmal abdominal aorta, without hemodynamically significant stenosis. 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 The IMA is patent. --No retroperitoneal lymphadenopathy. --No mesenteric lymphadenopathy. --No pelvic or inguinal lymphadenopathy. Reproductive: Unremarkable Other: No ascites or free air. The abdominal wall is normal. Musculoskeletal. There is a chronic appearing L1 compression fracture with mild retropulsion. There is no new acute displaced fracture. The patient is status post prior intramedullary nail placement through the left femur. IMPRESSION: 1. No acute abdominopelvic abnormality. 2. Rectosigmoid diverticulosis without acute inflammation. 3. Cardiomegaly and trace bilateral pleural effusions, right greater than left. Aortic Atherosclerosis (ICD10-I70.0). Electronically Signed   By: Constance Holster M.D.   On: 11/15/2019 19:58   ECHOCARDIOGRAM COMPLETE  Result Date: 11/16/2019    ECHOCARDIOGRAM REPORT   Patient Name:   [name] Date of Exam: 11/16/2019 Medical Rec #:  [id number]         Height:       62.0 in Accession #:    [id number]        Weight:       150.0 lb Date of Birth:  01/23/1929         BSA:          1.692 m Patient Age:    16 years          BP:           155/66 mmHg Patient Gender: F                 HR:           75 bpm. Exam Location:  [facility] Procedure: 2D Echo Indications:    NSTEMI I21.4  History:        Patient has prior history of Echocardiogram examinations, most                 recent 08/29/2009. Arrythmias:Atrial Fibrillation,                 Signs/Symptoms:Chest Pain; Risk Factors:Hypertension, Diabetes,                 Dyslipidemia and Non-Smoker. Closed intertrochanteric fracture                 of left hip, initial encounter.  Sonographer:    Leavy Cella RDCS (AE) Referring Phys: [place]  1. Left  ventricular ejection fraction, by estimation, is 35 to 40%. The left ventricle has moderately decreased function. The left ventricle demonstrates global hypokinesis. The left ventricular internal cavity size was mildly dilated. There is mild left ventricular hypertrophy. Left ventricular diastolic parameters are consistent with Grade I diastolic dysfunction (impaired relaxation).  2. Right ventricular systolic function  is normal. The right ventricular size is normal. Tricuspid regurgitation signal is inadequate for assessing PA pressure.  3. Left atrial size was moderately dilated.  4. The mitral valve is grossly normal. Mild mitral valve regurgitation.  5. The aortic valve is tricuspid. Aortic valve regurgitation is mild.  6. The inferior vena cava is normal in size with greater than 50% respiratory variability, suggesting right atrial pressure of 3 mmHg. FINDINGS  Left Ventricle: Left ventricular ejection fraction, by estimation, is 35 to 40%. The left ventricle has moderately decreased function. The left ventricle demonstrates global hypokinesis. The left ventricular internal cavity size was mildly dilated. There is mild left ventricular hypertrophy. Left ventricular diastolic parameters are consistent with Grade I diastolic dysfunction (impaired relaxation). Right Ventricle: The right ventricular size is normal. No increase in right ventricular wall thickness. Right ventricular systolic function is normal. Tricuspid regurgitation signal is inadequate for assessing PA pressure. Left Atrium: Left atrial size was moderately dilated. Right Atrium: Right atrial size was normal in size. Pericardium: There is no evidence of pericardial effusion. Presence of pericardial fat pad. Mitral Valve: The mitral valve is grossly normal. Mild to moderate mitral annular calcification. Mild mitral valve regurgitation. Tricuspid Valve: The tricuspid valve is grossly normal. Tricuspid valve regurgitation is trivial. Aortic Valve: The  aortic valve is tricuspid. Aortic valve regurgitation is mild. Pulmonic Valve: The pulmonic valve was grossly normal. Pulmonic valve regurgitation is trivial. Aorta: The aortic root is normal in size and structure. Venous: The inferior vena cava was not well visualized. The inferior vena cava is normal in size with greater than 50% respiratory variability, suggesting right atrial pressure of 3 mmHg. IAS/Shunts: No atrial level shunt detected by color flow Doppler. Rozann Lesches MD Electronically signed by Rozann Lesches MD Signature Date/Time: 11/16/2019/10:42:14 AM    Final         Scheduled Meds: . aspirin EC  81 mg Oral Daily  . carvedilol  6.25 mg Oral BID WC  . Chlorhexidine Gluconate Cloth  6 each Topical Daily  . cycloSPORINE  1 drop Both Eyes BID  . docusate sodium  100 mg Oral BID  . donepezil  5 mg Oral QHS  . iron polysaccharides  150 mg Oral BID  . lidocaine  1 patch Transdermal Q24H  . losartan  25 mg Oral Daily  . memantine  10 mg Oral BID  . nortriptyline  25 mg Oral QHS  . simvastatin  40 mg Oral q1800  . sodium chloride flush  3 mL Intravenous Q12H  . spironolactone  12.5 mg Oral Daily   Continuous Infusions: . sodium chloride    . heparin 1,200 Units/hr (11/16/19 2109)     LOS: 2 days    Time spent: 35 minutes    Kathie Dike, MD [facility]   If 7PM-7AM, please contact night-coverage [email or web address]  11/17/2019, 12:14 AM

## 2019-11-16 NOTE — Progress Notes (Signed)
ANTICOAGULATION CONSULT NOTE  Pharmacy Consult for heparin Indication: chest pain/ACS    Patient Measurements: Height: 5\' 2"  (157.5 cm) Weight: 68 kg (150 lb) IBW/kg (Calculated) : 50.1 Heparin Dosing Weight: 65kg  Vital Signs: Temp: 98.4 F (36.9 C) (05/04 2052) Temp Source: Oral (05/04 2052) BP: 142/61 (05/04 2052) Pulse Rate: 74 (05/04 2052)  Labs: Recent Labs    11/15/19 1758 11/15/19 2232 11/16/19 0031 11/16/19 0735 11/16/19 1907  HGB 10.4*  --   --  9.8*  --   HCT 32.2*  --   --  28.2*  --   PLT 230  --   --  222  --   HEPARINUNFRC  --   --   --  <0.10* 0.13*  CREATININE 0.70  --   --  0.59  --   TROPONINIHS 1,332* 1,433* 1,403*  --   --     Estimated Creatinine Clearance: 43.1 mL/min (by C-G formula based on SCr of 0.59 mg/dL).  Assessment: 86yo female presents from assisted living facility for decreased appetite and emesis, no c/o from pt but limited w/ dementia, troponin found to be elevated (was 20 during ED visit 3d ago, now 1332), concern for ACS, to begin heparin; h/o Afib noted but not on anticoag PTA, likely d/t recurrent falls.  11/16/19 9:03 PM  - HL remains below goal at 0.13 - No bleeding or issues with infusion per RN  Goal of Therapy:  Heparin level 0.3-0.7 units/ml Monitor platelets by anticoagulation protocol: Yes     Plan:  Give heparin 1900 unit  bolus x1 now Increase heparin infusion rate to 1200 units/hr Re-check heparin level at 0500 Daily CBC and heparin level Monitor for signs and symptoms of bleeding.   Ulice Dash, PharmD, BCPS 11/16/2019 8:57 PM

## 2019-11-16 NOTE — Consult Note (Addendum)
Cardiology Consultation:   Patient ID: KORRYN MONTANARI MRN: [id number]; DOB: 25-Apr-1929  Admit date: 11/15/2019 Date of Consult: 11/16/2019  Primary Care Provider: Redmond School, MD Consulting Cardiologist: Satira Sark, MD Primary Electrophysiologist:  None     Patient Profile:   MINI HORSTMEYER is a 86 y.o. female who is being seen today for the evaluation of recent chest and back pain with abnormal HS troponin I at the request of Dr. Darrick Meigs.  History of Present Illness:   Ms. Riedesel is an 86 yo female with an apparent history of PAF (not anticoagulated), HTN, HLD, and dementia. She resides at [facility]. She came in from nursing home with vomiting and complained of chest pain into her back. Chest CT showed trace pleural effusions, mild cardiomegaly with coronary calcification and boderlne enlarge ascending aorta at 4 cm. CT abd unremarkable. Patient in ED 11/12/19 with rib pain after fall and BP was elevated - hypertensive urgency. HS troponin I was 20. Now HS troponin I peak 1433. ECG with nonspecific ST changes and LVH.  Son is at bedside. Patient has no short term memory and can't recall what brought her in here. Denies any chest pain, dyspnea, dizziness or presyncope at the current time.   Past Medical History:  Diagnosis Date  . Atrial fibrillation ([place])   . Dementia ([place])   . Diverticulitis 8/09, 3/10   Abscess in 2009 (medical tx)  . Essential hypertension   . Hyperlipidemia   . Hyperthyroidism    Radiation, surgery  . IDA (iron deficiency anemia)   . Osteoporosis   . Postoperative anemia due to acute blood loss 09/08/2016  . Thalassemia   . Tubular adenoma of colon 12/15/08   Colonosocpy Dr Tonita Cong removed cecum  . Type 2 diabetes mellitus ([employer])     Past Surgical History:  Procedure Laterality Date  . CESAREAN SECTION     x2  . CHOLECYSTECTOMY    . FOOT SURGERY     bilat  . INTRAMEDULLARY (IM) NAIL INTERTROCHANTERIC Left 09/06/2016   Procedure: LEFT  HIP INTRAMEDULLARY (IM) NAIL;  Surgeon: Renette Butters, MD;  Location: [facility];  Service: Orthopedics;  Laterality: Left;  . THYROIDECTOMY, PARTIAL       Home Medications:  Prior to Admission medications   Medication Sig Start Date End Date Taking? Authorizing Provider  alendronate (FOSAMAX) 70 MG tablet Take 70 mg by mouth once a week. Take with a full glass of water on an empty stomach. Take on Wednesdays   Yes [provider]  calcium citrate-vitamin D 500-400 MG-UNIT chewable tablet Chew by mouth 2 (two) times daily.   Yes [provider]  Cranberry 450 MG TABS Take 450 mg by mouth 2 (two) times daily.   Yes [provider]  cycloSPORINE (RESTASIS) 0.05 % ophthalmic emulsion Place 1 drop into both eyes 2 (two) times daily.   Yes [provider]  docusate sodium (COLACE) 100 MG capsule Take 100 mg by mouth 2 (two) times daily.   Yes [provider]  donepezil (ARICEPT) 5 MG tablet Take 5 mg by mouth at bedtime.   Yes [provider]  fish oil-omega-3 fatty acids 1000 MG capsule Take 2 g by mouth daily.     Yes [provider]  iron polysaccharides (NIFEREX) 150 MG capsule Take 150 mg by mouth 2 (two) times daily.   Yes [provider]  lidocaine (LIDODERM) 5 % Place 1 patch onto the skin daily. Remove & Discard patch  within 12 hours or as directed by MD Patient taking differently: Place 1 patch onto the skin daily. Remove & Discard patch within 12 hours or as directed by MD. *to be applied to right side 11/12/19  Yes Nuala Alpha A, PA-C  meclizine (ANTIVERT) 12.5 MG tablet Take 12.5 mg by mouth every 6 (six) hours as needed for dizziness.   Yes [provider]  memantine (NAMENDA) 10 MG tablet Take 10 mg by mouth 2 (two) times daily.   Yes [provider]  metoprolol tartrate (LOPRESSOR) 25 MG tablet Take 12.5 mg by mouth daily.   Yes [provider]  nortriptyline (PAMELOR) 25 MG capsule  Take 25 mg by mouth at bedtime.   Yes [provider]    Inpatient Medications: Scheduled Meds: . Chlorhexidine Gluconate Cloth  6 each Topical Daily  . cycloSPORINE  1 drop Both Eyes BID  . docusate sodium  100 mg Oral BID  . donepezil  5 mg Oral QHS  . iron polysaccharides  150 mg Oral BID  . lidocaine  1 patch Transdermal Q24H  . memantine  10 mg Oral BID  . metoprolol tartrate  25 mg Oral BID  . nortriptyline  25 mg Oral QHS  . simvastatin  40 mg Oral q1800  . sodium chloride flush  3 mL Intravenous Q12H   Continuous Infusions: . sodium chloride    . heparin Stopped (11/15/19 2331)   PRN Meds: sodium chloride, meclizine, morphine injection, ondansetron **OR** ondansetron (ZOFRAN) IV, sodium chloride flush  Allergies:    Allergies  Allergen Reactions  . Bee Venom     Listed per Nursing Center-No reaction is noted  . Carbapenems     Listed per Nursing Center-No reaction is noted  . Cephalosporins     Listed per Nursing Center-No reaction is noted  . Procaine Hcl Other (See Comments)    Numbness/jerkiness  . Sulfonamide Derivatives Other (See Comments)    hypertension  . Penicillins Rash    Has patient had a PCN reaction causing immediate rash, facial/tongue/throat swelling, SOB or lightheadedness with hypotension:YES Has patient had a PCN reaction causing severe rash involving mucus membranes or skin necrosis:NO Has patient had a PCN reaction that required hospitalizationNO Has patient had a PCN reaction occurring within the last 10 years: NO If all of the above answers are "NO", then may proceed with Cephalosporin use.    Social History:   Social History   Tobacco Use  . Smoking status: Never Smoker  . Smokeless tobacco: Never Used  Substance Use Topics  . Alcohol use: Never  . Drug use: No    Family History:     Family History  Problem Relation Age of Onset  . Pneumonia Father   . Coronary artery disease Mother   . Stroke Mother   . Colon  cancer Mother 56  . Thalassemia Sister      ROS:  Please see the history of present illness.  Review of Systems  Reason unable to perform ROS: dementia.   All other ROS reviewed and negative.     Physical Exam/Data:   Vitals:   11/16/19 0030 11/16/19 0144 11/16/19 0512 11/16/19 0908  BP: (!) 161/71 (!) 179/78 (!) 155/66   Pulse: 77 77 75   Resp: (!) 21 20 20    Temp:  98.2 F (36.8 C) 98.5 F (36.9 C)   TempSrc:      SpO2: 92% 96% 93% 91%  Weight:      Height:  Intake/Output Summary (Last 24 hours) at 11/16/2019 0923 Last data filed at 11/16/2019 0024 Gross per 24 hour  Intake 33.63 ml  Output --  Net 33.63 ml   Last 3 Weights 11/15/2019 11/12/2019 10/30/2016  Weight (lbs) 150 lb 140 lb 132 lb 3.2 oz  Weight (kg) 68.04 kg 63.504 kg 59.966 kg     Body mass index is 27.44 kg/m.  General:  Elderly woman, in no acute distress  HEENT: normal Lymph: no adenopathy Neck: no JVD Endocrine:  No thryomegaly Vascular: No carotid bruits; FA pulses 2+ bilaterally without bruits  Cardiac:  normal S1, S2; [id number] systolic murmur LSB Lungs:  Decreased BS bases otherwise clear to auscultation bilaterally, no wheezing, rhonchi or rales  Abd: soft, nontender, no hepatomegaly  Ext: no edema Musculoskeletal:  No deformities, BUE and BLE strength normal and equal Skin: warm and dry  Neuro:  CNs 2-12 intact, no focal abnormalities noted Psych:  Normal affect   EKG:  The EKG was personally reviewed and demonstrates:  NSR with LVH and repolarization changes and flattening ST laterallly Telemetry:  Telemetry was personally reviewed and demonstrates:  NSR  Relevant CV Studies: Echo 08/2009 Normal LVEF 55%, trace MR/AI  Laboratory Data:  High Sensitivity Troponin:   Recent Labs  Lab 11/12/19 2004 11/12/19 2144 11/15/19 1758 11/15/19 2232 11/16/19 0031  TROPONINIHS 12 20* 1,332* 1,433* 1,403*     Chemistry Recent Labs  Lab 11/12/19 2004 11/15/19 1758 11/16/19 0735  NA  141 138 137  K 3.5 3.7 3.5  CL 103 103 101  CO2 28 25 25   GLUCOSE 105* 166* 167*  BUN 13 22 19   CREATININE 0.60 0.70 0.59  CALCIUM 8.8* 8.7* 8.5*  GFRNONAA >60 >60 >60  GFRAA >60 >60 >60  ANIONGAP 10 10 11     Recent Labs  Lab 11/15/19 1758 11/16/19 0735  PROT 6.1* 6.4*  ALBUMIN 3.8 3.7  AST 34 36  ALT 24 26  ALKPHOS 52 51  BILITOT 1.5* 1.6*   Hematology Recent Labs  Lab 11/12/19 2004 11/15/19 1758 11/16/19 0735  WBC 11.9* 13.6* 14.0*  RBC 5.58* 4.61 3.75*  HGB 11.4* 10.4* 9.8*  HCT 37.5 32.2* 28.2*  MCV 67.2* 69.8* 75.2*  MCH 20.4* 22.6* 26.1  MCHC 30.4 32.3 34.8  RDW 18.0* 18.8* 22.5*  PLT 272 230 222    Radiology/Studies:  DG Ribs Unilateral W/Chest Right  Result Date: 11/12/2019 CLINICAL DATA:  Right-sided rib pain, no known injury, initial encounter EXAM: RIGHT RIBS AND CHEST - 3+ VIEW COMPARISON:  09/05/2016 FINDINGS: Cardiac shadow is within normal limits. Aortic calcifications are seen. Mild interstitial changes are again identified. No rib fractures are noted IMPRESSION: No rib fracture is noted. Electronically Signed   By: Inez Catalina M.D.   On: 11/12/2019 18:42   CT CHEST WO CONTRAST  Result Date: 11/16/2019 CLINICAL DATA:  86 year old female with chest pain. EXAM: CT CHEST WITHOUT CONTRAST TECHNIQUE: Multidetector CT imaging of the chest was performed following the standard protocol without IV contrast. COMPARISON:  Chest radiograph dated 09/05/2016 FINDINGS: Evaluation of this exam is limited in the absence of intravenous contrast. Cardiovascular: There is mild cardiomegaly. No pericardial effusion. Coronary vascular calcifications noted. There is mild atherosclerotic calcification of the thoracic aorta. Evaluation is limited on this noncontrast CT. The ascending aorta measures up to 4 cm in diameter. The central pulmonary arteries are grossly unremarkable. Mediastinum/Nodes: No hilar or mediastinal adenopathy. The esophagus is grossly unremarkable. No  mediastinal fluid collection. Status post prior  right hemithyroidectomy. Lungs/Pleura: Trace bilateral pleural effusions. Focal area of subpleural scarring/thickening along the medial right upper lobe and right apex. Pneumonia is less likely but not excluded. No pneumothorax. The central airways are patent. Upper Abdomen: No acute abnormality. Musculoskeletal: Degenerative changes of the spine. No acute osseous pathology. IMPRESSION: 1. Trace bilateral pleural effusions. 2. Focal area of subpleural scarring/thickening along the medial right upper lobe and right apex. Pneumonia is less likely but not excluded. 3. Mild cardiomegaly with coronary vascular calcifications. 4. Borderline enlarged ascending aorta measuring 4 cm in diameter. Recommend annual imaging followup by CTA or MRA. This recommendation follows 2010 ACCF/AHA/AATS/ACR/ASA/SCA/SCAI/SIR/STS/SVM Guidelines for the Diagnosis and Management of Patients with Thoracic Aortic Disease. Circulation. 2010; [id number]. Aortic aneurysm NOS (ICD10-I71.9) 5. Aortic Atherosclerosis (ICD10-I70.0). Electronically Signed   By: Anner Crete M.D.   On: 11/16/2019 01:09   CT ABDOMEN PELVIS W CONTRAST  Result Date: 11/15/2019 CLINICAL DATA:  Right upper quadrant abdominal pain. Nausea and vomiting. History of tubular adenoma of the colon EXAM: CT ABDOMEN AND PELVIS WITH CONTRAST TECHNIQUE: Multidetector CT imaging of the abdomen and pelvis was performed using the standard protocol following bolus administration of intravenous contrast. CONTRAST:  168mL OMNIPAQUE IOHEXOL 300 MG/ML  SOLN COMPARISON:  January 15, 2008 FINDINGS: Lower chest: There are trace bilateral pleural effusions, right greater than left.There is significant cardiomegaly. Hepatobiliary: The liver is normal. Status post cholecystectomy.There is no biliary ductal dilation. Pancreas: Normal contours without ductal dilatation. No peripancreatic fluid collection. Spleen: Unremarkable. Adrenals/Urinary  Tract: --Adrenal glands: Unremarkable. --Right kidney/ureter: No hydronephrosis or radiopaque kidney stones. --Left kidney/ureter: No hydronephrosis or radiopaque kidney stones. --Urinary bladder: Unremarkable. Stomach/Bowel: --Stomach/Duodenum: No hiatal hernia or other gastric abnormality. Normal duodenal course and caliber. --Small bowel: Unremarkable. --Colon: Rectosigmoid diverticulosis without acute inflammation. --Appendix: Normal. Vascular/Lymphatic: Atherosclerotic calcification is present within the non-aneurysmal abdominal aorta, without hemodynamically significant stenosis. 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 The IMA is patent. --No retroperitoneal lymphadenopathy. --No mesenteric lymphadenopathy. --No pelvic or inguinal lymphadenopathy. Reproductive: Unremarkable Other: No ascites or free air. The abdominal wall is normal. Musculoskeletal. There is a chronic appearing L1 compression fracture with mild retropulsion. There is no new acute displaced fracture. The patient is status post prior intramedullary nail placement through the left femur. IMPRESSION: 1. No acute abdominopelvic abnormality. 2. Rectosigmoid diverticulosis without acute inflammation. 3. Cardiomegaly and trace bilateral pleural effusions, right greater than left. Aortic Atherosclerosis (ICD10-I70.0). Electronically Signed   By: Constance Holster M.D.   On: 11/15/2019 19:58    TIMI Risk Score for Unstable Angina or Non-ST Elevation MI:   The patient's TIMI risk score is 3, which indicates a 13% risk of all cause mortality, new or recurrent myocardial infarction or need for urgent revascularization in the next 14 days.   Assessment and Plan:   1. NSTEMI/chest pain and elevated troponin 1332, 1433, 1403. Most likely type 2 event with myocardial strain. She is pain free now, nonspecific EKG changes. Will get echo to access LV function. Will plan to treat medically. Adjust metoprolol dose to 25 mg bid  and if BP allows add amlodipine or Imdur. 2. PAF by history. CHADS2VASC=6 not on anticoagulation-with fall risk/dementia would not anticoagulate. I don't see any documented Afib although EKG readout 4/30 says Afib but is NSR.  3. HTN with hypertensive urgency 11/12/19(203/77)-not on any meds in NH. Now on metoprolol 12.5 mg once daily. Was on 25 mg bid on ER note 4/30/21Will increase to 25mg   bid and add amlodipine 5 mg once daily if BP tolerates 4. HLD-previously on Zocor 40 mg daily-will resume. Check FLP 5. DM2 6. Dementia   For questions or updates, please contact [facility] Please consult [email or web address] for contact info under     Signed, Ermalinda Barrios, PA-C 11/16/2019 9:23 AM    Attending note:  Patient seen and examined.  I reviewed her records and discussed the case with Ms. Delene Ruffini.  I agree with her above findings.  Also spoke with the patient's son present in the room.  Ms. Daloia resides at [facility] with dementia at baseline, very poor short-term memory.  History indicates PAF but details are not clear, she is not anticoagulated and her recent ECGs show sinus rhythm.  Also history of hypertension, hyperlipidemia, and type 2 diabetes mellitus.  She was recently evaluated after a fall, readmitted to the hospital with nausea and emesis, also chest and back discomfort.  Peak high-sensitivity troponin I is 1433.  She does not recall the recent events, denies chest discomfort at this time and is otherwise hemodynamically stable.  On examination this morning she is in no distress.  She is afebrile, heart rate in the 70s in sinus rhythm by telemetry.  Systolic blood pressure [id number] to 180s.  Lungs are clear.  Cardiac exam reveals RRR with 2/6 systolic murmur at the base.  Pertinent lab work includes creatinine 0.59, peak high-sensitivity troponin I 1433, LDL 139, hemoglobin 9.8 down from 10.4 with low MCV, platelets 222, SARS coronavirus 2 test negative.  I personally reviewed her ECG  which shows sinus rhythm with LVH and repolarization abnormalities.  Chest CT shows trace bilateral pleural effusions with focal region of subpleural scarring/thickening medial right upper lobe and right apex, coronary artery calcifications with mild cardiomegaly and mildly enlarged ascending aorta at 4 cm.  Type II NSTEMI, most likely myocardial strain rather than vascular obstruction with ACS.  With chronic anemia and recent decrease in hemoglobin, would not anticoagulate with heparin.  Start low-dose aspirin if tolerated, resuming Zocor, increase Lopressor to 25 mg twice daily.  Echocardiogram ordered. Will likely plan to start ARB next.  Anticipate conservative management without further ischemic testing.  Satira Sark, M.D., F.A.C.C.

## 2019-11-16 NOTE — Progress Notes (Signed)
ANTICOAGULATION CONSULT NOTE - Initial Consult  Pharmacy Consult for heparin Indication: chest pain/ACS    Patient Measurements: Height: 5\' 2"  (157.5 cm) Weight: 68 kg (150 lb) IBW/kg (Calculated) : 50.1 Heparin Dosing Weight: 65kg  Vital Signs: Temp: 98.5 F (36.9 C) (05/04 0512) BP: 155/66 (05/04 0512) Pulse Rate: 75 (05/04 0512)  Labs: Recent Labs    11/15/19 1758 11/15/19 2232 11/16/19 0031 11/16/19 0735  HGB 10.4*  --   --  9.8*  HCT 32.2*  --   --  28.2*  PLT 230  --   --  222  HEPARINUNFRC  --   --   --  <0.10*  CREATININE 0.70  --   --  0.59  TROPONINIHS 1,332* 1,433* 1,403*  --     Estimated Creatinine Clearance: 43.1 mL/min (by C-G formula based on SCr of 0.59 mg/dL).  Assessment: 86yo female presents from assisted living facility for decreased appetite and emesis, no c/o from pt but limited w/ dementia, troponin found to be elevated (was 20 during ED visit 3d ago, now 1332), concern for ACS, to begin heparin; h/o Afib noted but not on anticoag PTA, likely d/t recurrent falls.  Goal of Therapy:  Heparin level 0.3-0.7 units/ml Monitor platelets by anticoagulation protocol: Yes   11/16/19 1100 update: Heparin level: 0.11 IU/mL, sub-therapeutic on heparin 900 units/hr CBC:  Hb now 9.8mg /dL (baseline 10.4) RN reports no bleeding complications or issues with infusion site.    Plan:  Give heparin 1000 unit  bolus x1 now Increase heparin infusion rate to 1000 units/hr Re-check heparin level at ~1800 today Daily CBC and heparin level Monitor for signs and symptoms of bleeding.   Despina Pole, Pharm. D. Clinical Pharmacist 11/16/2019 11:26 AM

## 2019-11-16 NOTE — Progress Notes (Signed)
*  PRELIMINARY RESULTS* Echocardiogram 2D Echocardiogram has been performed.  Leavy Cella 11/16/2019, 10:16 AM

## 2019-11-16 NOTE — ED Notes (Signed)
Unable to obtain IV large enough for CT angio, MD [place] notified. Verbal orders to leave heparin gtt stopped til morning and let Cardio see pt first.

## 2019-11-16 NOTE — ED Notes (Signed)
Attempted report x1. 

## 2019-11-16 NOTE — Progress Notes (Signed)
RN in to start new IV on patient. NT obtaining vitals. Pt found to be hypoxic at 87% on RA. RN placed patient on 2 Liter [place], with noted increase in oxygen saturations to 94%. Patient is drowsy, arouses to voice, but easily and quickly falls back asleep. Noted to grunt at times. Patient denies pain, shortness of breath, or chest pain. Son reports she is much more sleepy today. Noted that patient had 2 doses of morphine overnight for pain. Dr Roderic Palau notified of findings. Order for ABG obtained at this time. RT notified to draw ABG.

## 2019-11-17 DIAGNOSIS — I712 Thoracic aortic aneurysm, without rupture: Secondary | ICD-10-CM | POA: Diagnosis present

## 2019-11-17 DIAGNOSIS — I480 Paroxysmal atrial fibrillation: Secondary | ICD-10-CM | POA: Diagnosis not present

## 2019-11-17 DIAGNOSIS — I429 Cardiomyopathy, unspecified: Secondary | ICD-10-CM | POA: Diagnosis not present

## 2019-11-17 DIAGNOSIS — E089 Diabetes mellitus due to underlying condition without complications: Secondary | ICD-10-CM

## 2019-11-17 DIAGNOSIS — I7121 Aneurysm of the ascending aorta, without rupture: Secondary | ICD-10-CM | POA: Diagnosis present

## 2019-11-17 DIAGNOSIS — I428 Other cardiomyopathies: Secondary | ICD-10-CM

## 2019-11-17 LAB — CBC
HCT: 31.3 % — ABNORMAL LOW (ref 36.0–46.0)
Hemoglobin: 9.6 g/dL — ABNORMAL LOW (ref 12.0–15.0)
MCH: 20 pg — ABNORMAL LOW (ref 26.0–34.0)
MCHC: 30.7 g/dL (ref 30.0–36.0)
MCV: 65.1 fL — ABNORMAL LOW (ref 80.0–100.0)
Platelets: 227 10*3/uL (ref 150–400)
RBC: 4.81 MIL/uL (ref 3.87–5.11)
RDW: 15.1 % (ref 11.5–15.5)
WBC: 14.3 10*3/uL — ABNORMAL HIGH (ref 4.0–10.5)
nRBC: 0 % (ref 0.0–0.2)

## 2019-11-17 LAB — BASIC METABOLIC PANEL
Anion gap: 7 (ref 5–15)
BUN: 17 mg/dL (ref 8–23)
CO2: 26 mmol/L (ref 22–32)
Calcium: 8.3 mg/dL — ABNORMAL LOW (ref 8.9–10.3)
Chloride: 104 mmol/L (ref 98–111)
Creatinine, Ser: 0.64 mg/dL (ref 0.44–1.00)
GFR calc Af Amer: >60 mL/min (ref >60)
GFR calc non Af Amer: >60 mL/min (ref >60)
Glucose, Bld: 115 mg/dL — ABNORMAL HIGH (ref 70–99)
Potassium: 3.1 mmol/L — ABNORMAL LOW (ref 3.5–5.1)
Sodium: 137 mmol/L (ref 135–145)

## 2019-11-17 LAB — URINE CULTURE: Culture: NO GROWTH

## 2019-11-17 MED ORDER — POTASSIUM CHLORIDE CRYS ER 20 MEQ PO TBCR
40.0000 meq | EXTENDED_RELEASE_TABLET | ORAL | Status: AC
  Administered 2019-11-17 (×2): 40 meq via ORAL
  Filled 2019-11-17 (×2): qty 2

## 2019-11-17 MED ORDER — TAMSULOSIN HCL 0.4 MG PO CAPS
0.4000 mg | ORAL_CAPSULE | Freq: Every day | ORAL | Status: DC
  Administered 2019-11-17: 22:00:00 0.4 mg via ORAL

## 2019-11-17 MED ORDER — ACETAMINOPHEN 325 MG PO TABS
650.0000 mg | ORAL_TABLET | Freq: Four times a day (QID) | ORAL | Status: DC | PRN
  Administered 2019-11-17: 650 mg via ORAL
  Filled 2019-11-17: qty 2

## 2019-11-17 MED ORDER — ENOXAPARIN SODIUM 40 MG/0.4ML ~~LOC~~ SOLN
40.0000 mg | Freq: Every day | SUBCUTANEOUS | Status: DC
  Administered 2019-11-17 – 2019-11-18 (×2): 40 mg via SUBCUTANEOUS
  Filled 2019-11-17 (×3): qty 0.4

## 2019-11-17 MED ORDER — LOSARTAN POTASSIUM 50 MG PO TABS
25.0000 mg | ORAL_TABLET | Freq: Once | ORAL | Status: AC
  Administered 2019-11-17: 25 mg via ORAL
  Filled 2019-11-17: qty 1

## 2019-11-17 MED ORDER — ISOSORBIDE MONONITRATE ER 30 MG PO TB24
15.0000 mg | ORAL_TABLET | Freq: Every evening | ORAL | Status: DC
  Administered 2019-11-17: 15 mg via ORAL
  Filled 2019-11-17: qty 1

## 2019-11-17 MED ORDER — LOSARTAN POTASSIUM 50 MG PO TABS
50.0000 mg | ORAL_TABLET | Freq: Every day | ORAL | Status: DC
  Administered 2019-11-18: 50 mg via ORAL
  Filled 2019-11-17: qty 1

## 2019-11-17 NOTE — TOC Progression Note (Signed)
Transition of Care [facility]) - Progression Note   Patient Details  Name: [name] MRN: [id number] Date of Birth: 12-18-1928  Transition of Care [facility]) CM/SW [place], LCSW Phone Number: 11/17/2019, 1:47 PM  Clinical Narrative: Per PT evaluation, patient requires min/moderate assistance to stand and transfer and min/moderate assistance with ambulation with a walker. CSW called Sharyn Lull with [facility] to see if patient can return to the ALF with that level of PT need. Sharyn Lull stated patient can return and requested that [facility] be set up through [facility]. Sharyn Lull asked when patient is expected to discharge. CSW followed up with physician and was informed patient will likely discharge tomorrow. CSW called Judson Roch with [facility] to make [facility] referral. Referral accepted and Judson Roch requested the orders be placed for face to face and HHPT. CSW called Sharyn Lull to inform her of patient's possible discharge tomorrow.  Readmission Risk Interventions No flowsheet data found.

## 2019-11-17 NOTE — Evaluation (Signed)
Physical Therapy Evaluation Patient Details Name: [name] MRN: [id number] DOB: 07/29/1928 Today's Date: 11/17/2019   History of Present Illness  [name]  is a 86 y.o. female, with medical history of atrial fibrillation not on anticoagulation, dementia, diabetes mellitus type 2, hypertension, hyperlipidemia, osteoporosis was brought to the ED from [facility] assisted facility for evaluation of vomiting.  Patient was seen in the hospital 3 days ago at that time her work-up was reassuring and was found to stable to discharge back to the facility.  Patient had decreased appetite, has been more tired and had 2 episodes of nonbloody nonbilious emesis.  She also complained of chest pain with radiation to upper back in the ED.  In the ED patient was found to be in hypertensive with elevated troponin I 332.  Cardiology fellow was consulted by ED provider who recommended starting her on heparin anticoagulation.Patient denies shortness of breath.Denies nausea or vomiting at this time.Denies abdominal pain or dysuria.    Clinical Impression  Patient demonstrates slow labored movement for sitting up at bedside requiring use of bed rail, transferred to commode in bathroom using RW/grab bars, limited to ambulation to doorway and back to bedside due to generalized weakness, once fatigued tends to push walker to far in front requiring Min/mod assist to step closer/maintain standing balance.  Patient tolerated sitting up in chair with her son present in room after therapy - RN notified.  Patient will benefit from continued physical therapy in hospital and recommended venue below to increase strength, balance, endurance for safe ADLs and gait.      Follow Up Recommendations SNF;Supervision for mobility/OOB;Supervision - Intermittent    Equipment Recommendations  None recommended by PT    Recommendations for Other Services       Precautions / Restrictions Precautions Precautions:  Fall Restrictions Weight Bearing Restrictions: No      Mobility  Bed Mobility Overal bed mobility: Needs Assistance Bed Mobility: Supine to Sit     Supine to sit: Supervision;Min guard     General bed mobility comments: increased time, labored movement, had to use bed rail  Transfers Overall transfer level: Needs assistance   Transfers: Sit to/from Stand;Stand Pivot Transfers Sit to Stand: Min guard;Min assist Stand pivot transfers: Min guard;Min assist       General transfer comment: increased time, labored movement  Ambulation/Gait Ambulation/Gait assistance: Min assist;Mod assist Gait Distance (Feet): 22 Feet Assistive device: Rolling walker (2 wheeled) Gait Pattern/deviations: Decreased step length - right;Decreased step length - left;Decreased stride length Gait velocity: decreased   General Gait Details: slow unsteady labored cadence, requires frequent verbal cues to step closer to RW with fair carryover, once fatigued required increased assistance to make it back to bedside, on room air with SpO2 at 89-90%  Stairs            Wheelchair Mobility    Modified Rankin (Stroke Patients Only)       Balance Overall balance assessment: Needs assistance Sitting-balance support: Feet supported;No upper extremity supported Sitting balance-Leahy Scale: Good Sitting balance - Comments: seated at EOB   Standing balance support: During functional activity;Bilateral upper extremity supported Standing balance-Leahy Scale: Fair Standing balance comment: using RW                             Pertinent Vitals/Pain Pain Assessment: No/denies pain    Home Living Family/patient expects to be discharged to:: Assisted living  Home Equipment: Walker - 2 wheels      Prior Function Level of Independence: Needs assistance   Gait / Transfers Assistance Needed: household ambulator using RW  ADL's / Homemaking Assistance Needed: assisted  by ALF staff        Hand Dominance   Dominant Hand: Right    Extremity/Trunk Assessment   Upper Extremity Assessment Upper Extremity Assessment: Generalized weakness    Lower Extremity Assessment Lower Extremity Assessment: Generalized weakness    Cervical / Trunk Assessment Cervical / Trunk Assessment: Kyphotic  Communication   Communication: No difficulties  Cognition Arousal/Alertness: Awake/alert Behavior During Therapy: WFL for tasks assessed/performed Overall Cognitive Status: Within Functional Limits for tasks assessed                                        General Comments      Exercises     Assessment/Plan    PT Assessment Patient needs continued PT services  PT Problem List Decreased strength;Decreased activity tolerance;Decreased balance;Decreased mobility       PT Treatment Interventions Gait training;Stair training;Functional mobility training;Therapeutic exercise;Therapeutic activities;Patient/family education    PT Goals (Current goals can be found in the Care Plan section)  Acute Rehab PT Goals Patient Stated Goal: return home PT Goal Formulation: With patient/family Time For Goal Achievement: 12/01/19 Potential to Achieve Goals: Good    Frequency Min 3X/week   Barriers to discharge        Co-evaluation               AM-PAC PT "6 Clicks" Mobility  Outcome Measure Help needed turning from your back to your side while in a flat bed without using bedrails?: None Help needed moving from lying on your back to sitting on the side of a flat bed without using bedrails?: A Little Help needed moving to and from a bed to a chair (including a wheelchair)?: A Little Help needed standing up from a chair using your arms (e.g., wheelchair or bedside chair)?: A Little Help needed to walk in hospital room?: A Lot Help needed climbing 3-5 steps with a railing? : A Lot 6 Click Score: 17    End of Session   Activity Tolerance:  Patient tolerated treatment well;Patient limited by fatigue Patient left: in chair;with call bell/phone within reach;with chair alarm set;with family/visitor present Nurse Communication: Mobility status PT Visit Diagnosis: Unsteadiness on feet (R26.81);Other abnormalities of gait and mobility (R26.89);Muscle weakness (generalized) (M62.81)    Time: [id number] PT Time Calculation (min) (ACUTE ONLY): 29 min   Charges:   PT Evaluation $PT Eval Moderate Complexity: 1 Mod PT Treatments $Therapeutic Activity: 23-37 mins        11:10 AM, 11/17/19 Lonell Grandchild, MPT Physical Therapist with [facility] [phone] office [phone] mobile phone

## 2019-11-17 NOTE — Care Management Important Message (Signed)
Important Message  Patient Details  Name: [name] MRN: [id number] Date of Birth: Dec 17, 1928   Medicare Important Message Given:  Yes     Tommy Medal 11/17/2019, 3:09 PM

## 2019-11-17 NOTE — Progress Notes (Signed)
CSW called patient's son, Ciri Estrela, per request for call back. Son reported he thought the patient might need SNF and would prefer [facility] if needed. CSW explained [facility] will be able to handle patient's HHPT needs when she returns and a referral had been made to [facility]. Son stated he or another family member (sons Christy Sartorius and Shanon Brow or son-in-law Lavell Luster) would be able to transport the patient back if she can be transported by car upon discharge.  Tobi Bastos, LCSW Transitions of Care Clinical Social Worker [facility] Ph: [phone]

## 2019-11-17 NOTE — Progress Notes (Signed)
PROGRESS NOTE    [name]  [id number] DOB: 06-27-1929 DOA: 11/15/2019 PCP: Redmond School, MD    Brief Narrative:  86 year old female with a history of paroxysmal atrial fibrillation, hypertension, dementia who is a resident of an assisted living facility, was admitted to the hospital with chest discomfort radiating into her back.  CT chest did not show any evidence of dissection.  Abdominal CT was also unrevealing.  She was noted to have positive troponin with a peak of 1433.  No acute EKG changes.  Patient was admitted for further management of non-STEMI.   Assessment & Plan:   Active Problems:   Diabetes mellitus due to underlying condition, controlled ([place])   HYPERCHOLESTEROLEMIA   Essential hypertension   PAROXYSMAL ATRIAL FIBRILLATION   Dementia (HCC)   Chest pain   NSTEMI (non-ST elevated myocardial infarction) ([place])   Cardiomyopathy ([place])   Ascending aortic aneurysm (HCC)   1)NSTEMI-- Seen by cardiology and felt to be type II MI.  Medical management advised, currently chest pain-free, troponins trending down. -Cardiology consult appreciated -No ischemic testing planned -Continue aspirin, Zocor, Coreg, Cozaar be increased to 50 mg daily, continue Aldactone, isosorbide and potassium added  2) secondary cardiomyopathy.- EF 35 to 40% with global hypokinesis.  Likely nonischemic cardiomyopathy.  Medication adjustments as above in #1  3)Paroxysmal atrial fibrillation.  Currently in sinus rhythm.  Heart rate is stable.  CHA2DS2-VASc score of 6.  Not on anticoagulation due to history of falls  4)Hypertension.  Blood pressures currently stable-cardiac medications as above #1  5)Dementia- at least moderate cognitive deficits at baseline stable.  Continue Aricept and Namenda.  6)Ascending aortic aneurysm.  Currently measuring 4 cm.  Recommended annual surveillance.  7) leukocytosis--- no fevers, urine culture NGTD   DVT prophylaxis: Lovenox Code Status:  DNR Family Communication: Discussed with son Christy Sartorius at the bedside Disposition Plan: Status is: Inpatient  Disposition: The patient is from: ALF              Anticipated d/c is to: ALF              Anticipated d/c date is: 1 day               Barriers--  Patient currently is not medically stable to d/c.--- Elevated WBC noted with urinary retention, elevated BP noted this a.m. cardiology titrating medications--BP improving with medication adjustment--anticipate discharge to [facility] with home health services on 11/18/2019 if continues to improve    Consultants:   Cardiology  Procedures:   Echo: Ejection fraction of 35 to 40% with global hypokinesis  Antimicrobials:      Subjective: -Discussed with son at bedside, BP was high this morning -Voiding difficulties persist, Foley catheter was placed -No chest pains -WBC remains elevated  Objective: Vitals:   11/16/19 2052 11/17/19 0508 11/17/19 0741 11/17/19 1319  BP: (!) 142/61 (!) 157/64 (!) 175/75 (!) 142/75  Pulse: 74 74 75 (!) 59  Resp: 18 16 (!) 21 18  Temp: 98.4 F (36.9 C) 97.7 F (36.5 C)  97.7 F (36.5 C)  TempSrc: Oral Axillary    SpO2: 95% 94% 95% 98%  Weight:      Height:        Intake/Output Summary (Last 24 hours) at 11/17/2019 1902 Last data filed at 11/17/2019 1333 Gross per 24 hour  Intake 363 ml  Output 750 ml  Net -387 ml   Filed Weights   11/15/19 1642  Weight: 68 kg    Examination:  General exam: Pleasantly confused  respiratory system: Clear to auscultation. Respiratory effort normal. Cardiovascular system: S1 & S2 heard, RRR. No JVD, murmurs,  Gastrointestinal system: Abdomen is nondistended, soft and nontender.  . Normal bowel sounds heard. Central nervous system: Generalized weakness, no focal neurological deficits. Skin: No rashes, lesions or ulcers Psychiatry: Somnolent, but wakes up to voice GU-Foley in situ  Data Reviewed:   CBC: Recent Labs  Lab 11/12/19 2004  11/15/19 1758 11/16/19 0735 11/17/19 0414  WBC 11.9* 13.6* 14.0* 14.3*  NEUTROABS 7.6 10.8*  --   --   HGB 11.4* 10.4* 9.8* 9.6*  HCT 37.5 32.2* 28.2* 31.3*  MCV 67.2* 69.8* 75.2* 65.1*  PLT 272 230 222 [id number]   Basic Metabolic Panel: Recent Labs  Lab 11/12/19 2004 11/15/19 1758 11/16/19 0735 11/17/19 0414  NA 141 138 137 137  K 3.5 3.7 3.5 3.1*  CL 103 103 101 104  CO2 28 25 25 26   GLUCOSE 105* 166* 167* 115*  BUN 13 22 19 17   CREATININE 0.60 0.70 0.59 0.64  CALCIUM 8.8* 8.7* 8.5* 8.3*   GFR: Estimated Creatinine Clearance: 43.1 mL/min (by C-G formula based on SCr of 0.64 mg/dL). Liver Function Tests: Recent Labs  Lab 11/15/19 1758 11/16/19 0735  AST 34 36  ALT 24 26  ALKPHOS 52 51  BILITOT 1.5* 1.6*  PROT 6.1* 6.4*  ALBUMIN 3.8 3.7   Recent Labs  Lab 11/15/19 1758  LIPASE 15   No results for input(s): AMMONIA in the last 168 hours. Coagulation Profile: No results for input(s): INR, PROTIME in the last 168 hours. Cardiac Enzymes: No results for input(s): CKTOTAL, CKMB, CKMBINDEX, TROPONINI in the last 168 hours. BNP (last 3 results) No results for input(s): PROBNP in the last 8760 hours. HbA1C: No results for input(s): HGBA1C in the last 72 hours. CBG: No results for input(s): GLUCAP in the last 168 hours. Lipid Profile: Recent Labs    11/16/19 0031  CHOL 203*  HDL 53  LDLCALC 139*  TRIG 56  CHOLHDL 3.8   Thyroid Function Tests: No results for input(s): TSH, T4TOTAL, FREET4, T3FREE, THYROIDAB in the last 72 hours. Anemia Panel: No results for input(s): VITAMINB12, FOLATE, FERRITIN, TIBC, IRON, RETICCTPCT in the last 72 hours. Sepsis Labs: No results for input(s): PROCALCITON, LATICACIDVEN in the last 168 hours.  Recent Results (from the past 240 hour(s))  Urine culture     Status: None   Collection Time: 11/15/19  8:18 PM   Specimen: Urine, Random  Result Value Ref Range Status   Specimen Description   Final    URINE, RANDOM Performed at  [facility], [address], [place], [place] [address]    Special Requests   Final    NONE Performed at [facility], [address], [place], [place] [address]    Culture   Final    NO GROWTH Performed at [facility], [place] [address] [place],  [address]    Report Status 11/17/2019 FINAL  Final  Respiratory Panel by RT PCR (Flu A&B, Covid) - Nasopharyngeal Swab     Status: None   Collection Time: 11/15/19  9:57 PM   Specimen: Nasopharyngeal Swab  Result Value Ref Range Status   SARS Coronavirus 2 by RT PCR NEGATIVE NEGATIVE Final    Comment: (NOTE) SARS-CoV-2 target nucleic acids are NOT DETECTED. The SARS-CoV-2 RNA is generally detectable in upper respiratoy specimens during the acute phase of infection. The lowest concentration of SARS-CoV-2 viral copies this  assay can detect is 131 copies/mL. A negative result does not preclude SARS-Cov-2 infection and should not be used as the sole basis for treatment or other patient management decisions. A negative result may occur with  improper specimen collection/handling, submission of specimen other than nasopharyngeal swab, presence of viral mutation(s) within the areas targeted by this assay, and inadequate number of viral copies (<131 copies/mL). A negative result must be combined with clinical observations, patient history, and epidemiological information. The expected result is Negative. Fact Sheet for Patients:  PinkCheek.be Fact Sheet for Healthcare Providers:  GravelBags.it This test is not yet ap proved or cleared by the [place] FDA and  has been authorized for detection and/or diagnosis of SARS-CoV-2 by FDA under an Emergency Use Authorization (EUA). This EUA will remain  in effect (meaning this test can be used) for the duration of the COVID-19 declaration under Section 564(b)(1) of the Act, 21 U.S.C. section 360bbb-3(b)(1), unless the  authorization is terminated or revoked sooner.    Influenza A by PCR NEGATIVE NEGATIVE Final   Influenza B by PCR NEGATIVE NEGATIVE Final    Comment: (NOTE) The Xpert Xpress SARS-CoV-2/FLU/RSV assay is intended as an aid in  the diagnosis of influenza from Nasopharyngeal swab specimens and  should not be used as a sole basis for treatment. Nasal washings and  aspirates are unacceptable for Xpert Xpress SARS-CoV-2/FLU/RSV  testing. Fact Sheet for Patients: PinkCheek.be Fact Sheet for Healthcare Providers: GravelBags.it This test is not yet approved or cleared by the [place] FDA and  has been authorized for detection and/or diagnosis of SARS-CoV-2 by  FDA under an Emergency Use Authorization (EUA). This EUA will remain  in effect (meaning this test can be used) for the duration of the  Covid-19 declaration under Section 564(b)(1) of the Act, 21  U.S.C. section 360bbb-3(b)(1), unless the authorization is  terminated or revoked. Performed at [facility], [address], [place], [place] [address]   MRSA PCR Screening     Status: None   Collection Time: 11/16/19  1:52 AM   Specimen: Nasopharyngeal  Result Value Ref Range Status   MRSA by PCR NEGATIVE NEGATIVE Final    Comment:        The GeneXpert MRSA Assay (FDA approved for NASAL specimens only), is one component of a comprehensive MRSA colonization surveillance program. It is not intended to diagnose MRSA infection nor to guide or monitor treatment for MRSA infections. Performed at [facility], [address], [place],  [address]          Radiology Studies: CT CHEST WO CONTRAST  Result Date: 11/16/2019 CLINICAL DATA:  86 year old female with chest pain. EXAM: CT CHEST WITHOUT CONTRAST TECHNIQUE: Multidetector CT imaging of the chest was performed following the standard protocol without IV contrast. COMPARISON:  Chest radiograph dated 09/05/2016  FINDINGS: Evaluation of this exam is limited in the absence of intravenous contrast. Cardiovascular: There is mild cardiomegaly. No pericardial effusion. Coronary vascular calcifications noted. There is mild atherosclerotic calcification of the thoracic aorta. Evaluation is limited on this noncontrast CT. The ascending aorta measures up to 4 cm in diameter. The central pulmonary arteries are grossly unremarkable. Mediastinum/Nodes: No hilar or mediastinal adenopathy. The esophagus is grossly unremarkable. No mediastinal fluid collection. Status post prior right hemithyroidectomy. Lungs/Pleura: Trace bilateral pleural effusions. Focal area of subpleural scarring/thickening along the medial right upper lobe and right apex. Pneumonia is less likely but not excluded. No pneumothorax. The central airways are patent. Upper Abdomen: No acute  abnormality. Musculoskeletal: Degenerative changes of the spine. No acute osseous pathology. IMPRESSION: 1. Trace bilateral pleural effusions. 2. Focal area of subpleural scarring/thickening along the medial right upper lobe and right apex. Pneumonia is less likely but not excluded. 3. Mild cardiomegaly with coronary vascular calcifications. 4. Borderline enlarged ascending aorta measuring 4 cm in diameter. Recommend annual imaging followup by CTA or MRA. This recommendation follows 2010 ACCF/AHA/AATS/ACR/ASA/SCA/SCAI/SIR/STS/SVM Guidelines for the Diagnosis and Management of Patients with Thoracic Aortic Disease. Circulation. 2010; [id number]. Aortic aneurysm NOS (ICD10-I71.9) 5. Aortic Atherosclerosis (ICD10-I70.0). Electronically Signed   By: Anner Crete M.D.   On: 11/16/2019 01:09   CT ABDOMEN PELVIS W CONTRAST  Result Date: 11/15/2019 CLINICAL DATA:  Right upper quadrant abdominal pain. Nausea and vomiting. History of tubular adenoma of the colon EXAM: CT ABDOMEN AND PELVIS WITH CONTRAST TECHNIQUE: Multidetector CT imaging of the abdomen and pelvis was performed using  the standard protocol following bolus administration of intravenous contrast. CONTRAST:  119mL OMNIPAQUE IOHEXOL 300 MG/ML  SOLN COMPARISON:  January 15, 2008 FINDINGS: Lower chest: There are trace bilateral pleural effusions, right greater than left.There is significant cardiomegaly. Hepatobiliary: The liver is normal. Status post cholecystectomy.There is no biliary ductal dilation. Pancreas: Normal contours without ductal dilatation. No peripancreatic fluid collection. Spleen: Unremarkable. Adrenals/Urinary Tract: --Adrenal glands: Unremarkable. --Right kidney/ureter: No hydronephrosis or radiopaque kidney stones. --Left kidney/ureter: No hydronephrosis or radiopaque kidney stones. --Urinary bladder: Unremarkable. Stomach/Bowel: --Stomach/Duodenum: No hiatal hernia or other gastric abnormality. Normal duodenal course and caliber. --Small bowel: Unremarkable. --Colon: Rectosigmoid diverticulosis without acute inflammation. --Appendix: Normal. Vascular/Lymphatic: Atherosclerotic calcification is present within the non-aneurysmal abdominal aorta, without hemodynamically significant stenosis. 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 The IMA is patent. --No retroperitoneal lymphadenopathy. --No mesenteric lymphadenopathy. --No pelvic or inguinal lymphadenopathy. Reproductive: Unremarkable Other: No ascites or free air. The abdominal wall is normal. Musculoskeletal. There is a chronic appearing L1 compression fracture with mild retropulsion. There is no new acute displaced fracture. The patient is status post prior intramedullary nail placement through the left femur. IMPRESSION: 1. No acute abdominopelvic abnormality. 2. Rectosigmoid diverticulosis without acute inflammation. 3. Cardiomegaly and trace bilateral pleural effusions, right greater than left. Aortic Atherosclerosis (ICD10-I70.0). Electronically Signed   By: Constance Holster M.D.   On: 11/15/2019 19:58   ECHOCARDIOGRAM  COMPLETE  Result Date: 11/16/2019    ECHOCARDIOGRAM REPORT   Patient Name:   MIKKI MORGANS Date of Exam: 11/16/2019 Medical Rec #:  [id number]         Height:       62.0 in Accession #:    [id number]        Weight:       150.0 lb Date of Birth:  September 13, 1928         BSA:          1.692 m Patient Age:    36 years          BP:           155/66 mmHg Patient Gender: F                 HR:           75 bpm. Exam Location:  [facility] Procedure: 2D Echo Indications:    NSTEMI I21.4  History:        Patient has prior history of Echocardiogram examinations, most  recent 08/29/2009. Arrythmias:Atrial Fibrillation,                 Signs/Symptoms:Chest Pain; Risk Factors:Hypertension, Diabetes,                 Dyslipidemia and Non-Smoker. Closed intertrochanteric fracture                 of left hip, initial encounter.  Sonographer:    Leavy Cella RDCS (AE) Referring Phys: [place]  1. Left ventricular ejection fraction, by estimation, is 35 to 40%. The left ventricle has moderately decreased function. The left ventricle demonstrates global hypokinesis. The left ventricular internal cavity size was mildly dilated. There is mild left ventricular hypertrophy. Left ventricular diastolic parameters are consistent with Grade I diastolic dysfunction (impaired relaxation).  2. Right ventricular systolic function is normal. The right ventricular size is normal. Tricuspid regurgitation signal is inadequate for assessing PA pressure.  3. Left atrial size was moderately dilated.  4. The mitral valve is grossly normal. Mild mitral valve regurgitation.  5. The aortic valve is tricuspid. Aortic valve regurgitation is mild.  6. The inferior vena cava is normal in size with greater than 50% respiratory variability, suggesting right atrial pressure of 3 mmHg. FINDINGS  Left Ventricle: Left ventricular ejection fraction, by estimation, is 35 to 40%. The left ventricle has moderately decreased function. The  left ventricle demonstrates global hypokinesis. The left ventricular internal cavity size was mildly dilated. There is mild left ventricular hypertrophy. Left ventricular diastolic parameters are consistent with Grade I diastolic dysfunction (impaired relaxation). Right Ventricle: The right ventricular size is normal. No increase in right ventricular wall thickness. Right ventricular systolic function is normal. Tricuspid regurgitation signal is inadequate for assessing PA pressure. Left Atrium: Left atrial size was moderately dilated. Right Atrium: Right atrial size was normal in size. Pericardium: There is no evidence of pericardial effusion. Presence of pericardial fat pad. Mitral Valve: The mitral valve is grossly normal. Mild to moderate mitral annular calcification. Mild mitral valve regurgitation. Tricuspid Valve: The tricuspid valve is grossly normal. Tricuspid valve regurgitation is trivial. Aortic Valve: The aortic valve is tricuspid. Aortic valve regurgitation is mild. Pulmonic Valve: The pulmonic valve was grossly normal. Pulmonic valve regurgitation is trivial. Aorta: The aortic root is normal in size and structure. Venous: The inferior vena cava was not well visualized. The inferior vena cava is normal in size with greater than 50% respiratory variability, suggesting right atrial pressure of 3 mmHg. IAS/Shunts: No atrial level shunt detected by color flow Doppler. Rozann Lesches MD Electronically signed by Rozann Lesches MD Signature Date/Time: 11/16/2019/10:42:14 AM    Final    Scheduled Meds: . aspirin EC  81 mg Oral Daily  . carvedilol  6.25 mg Oral BID WC  . Chlorhexidine Gluconate Cloth  6 each Topical Daily  . cycloSPORINE  1 drop Both Eyes BID  . docusate sodium  100 mg Oral BID  . donepezil  5 mg Oral QHS  . enoxaparin (LOVENOX) injection  40 mg Subcutaneous QHS  . iron polysaccharides  150 mg Oral BID  . isosorbide mononitrate  15 mg Oral QPM  . lidocaine  1 patch Transdermal Q24H   . [START ON 11/18/2019] losartan  50 mg Oral Daily  . memantine  10 mg Oral BID  . nortriptyline  25 mg Oral QHS  . simvastatin  40 mg Oral q1800  . sodium chloride flush  3 mL Intravenous Q12H  . spironolactone  12.5 mg  Oral Daily   Continuous Infusions: . sodium chloride       LOS: 2 days   Roxan Hockey, MD [facility]  If 7PM-7AM, please contact night-coverage [email or web address]  11/17/2019, 7:02 PM

## 2019-11-17 NOTE — Progress Notes (Signed)
Pt remains up in chair, denies c/o. Much less moaning since oral Tylenol admin'd this am. Pt ate 25% of breakfast and 100% of lunch. More alert, oriented still to person only, but pleasant and cooperative. Son remains at bedside.

## 2019-11-17 NOTE — Progress Notes (Addendum)
Progress Note  Patient Name: [name] Date of Encounter: 11/17/2019  Consulting Cardiologist:  Satira Sark, MD  Subjective   No chest pain.  States that she slept well.  Appetite is reasonable.  No shortness of breath at rest.  Inpatient Medications    Scheduled Meds: . aspirin EC  81 mg Oral Daily  . carvedilol  6.25 mg Oral BID WC  . Chlorhexidine Gluconate Cloth  6 each Topical Daily  . cycloSPORINE  1 drop Both Eyes BID  . docusate sodium  100 mg Oral BID  . donepezil  5 mg Oral QHS  . enoxaparin (LOVENOX) injection  40 mg Subcutaneous QHS  . iron polysaccharides  150 mg Oral BID  . lidocaine  1 patch Transdermal Q24H  . losartan  25 mg Oral Daily  . memantine  10 mg Oral BID  . nortriptyline  25 mg Oral QHS  . potassium chloride  40 mEq Oral Q3H  . simvastatin  40 mg Oral q1800  . sodium chloride flush  3 mL Intravenous Q12H  . spironolactone  12.5 mg Oral Daily   Continuous Infusions: . sodium chloride     PRN Meds: sodium chloride, acetaminophen, meclizine, morphine injection, ondansetron **OR** ondansetron (ZOFRAN) IV, sodium chloride flush   Vital Signs    Vitals:   11/16/19 1942 11/16/19 2052 11/17/19 0508 11/17/19 0741  BP:  (!) 142/61 (!) 157/64 (!) 175/75  Pulse:  74 74 75  Resp:  18 16 (!) 21  Temp:  98.4 F (36.9 C) 97.7 F (36.5 C)   TempSrc:  Oral Axillary   SpO2: 94% 95% 94% 95%  Weight:      Height:        Intake/Output Summary (Last 24 hours) at 11/17/2019 0930 Last data filed at 11/17/2019 0912 Gross per 24 hour  Intake 243 ml  Output 300 ml  Net -57 ml   Filed Weights   11/15/19 1642  Weight: 68 kg    Telemetry    Sinus rhythm.  Personally reviewed.  ECG    An ECG dated 11/17/2019 was personally reviewed today and demonstrated:  Sinus rhythm with anteroseptal Q waves, nonspecific ST changes.  Physical Exam   GEN:  Elderly woman.  No acute distress.   Neck: No JVD. Cardiac: RRR, 2/6 systolic murmur, no  gallop.  Respiratory: Nonlabored. Clear to auscultation bilaterally. GI: Soft, nontender, bowel sounds present. MS: No edema; No deformity.  Labs    Chemistry Recent Labs  Lab 11/15/19 1758 11/16/19 0735 11/17/19 0414  NA 138 137 137  K 3.7 3.5 3.1*  CL 103 101 104  CO2 25 25 26   GLUCOSE 166* 167* 115*  BUN 22 19 17   CREATININE 0.70 0.59 0.64  CALCIUM 8.7* 8.5* 8.3*  PROT 6.1* 6.4*  --   ALBUMIN 3.8 3.7  --   AST 34 36  --   ALT 24 26  --   ALKPHOS 52 51  --   BILITOT 1.5* 1.6*  --   GFRNONAA >60 >60 >60  GFRAA >60 >60 >60  ANIONGAP 10 11 7      Hematology Recent Labs  Lab 11/15/19 1758 11/16/19 0735 11/17/19 0414  WBC 13.6* 14.0* 14.3*  RBC 4.61 3.75* 4.81  HGB 10.4* 9.8* 9.6*  HCT 32.2* 28.2* 31.3*  MCV 69.8* 75.2* 65.1*  MCH 22.6* 26.1 20.0*  MCHC 32.3 34.8 30.7  RDW 18.8* 22.5* 15.1  PLT 230 222 227    Cardiac Enzymes Recent  Labs  Lab 11/12/19 2004 11/12/19 2144 11/15/19 1758 11/15/19 2232 11/16/19 0031  TROPONINIHS 12 20* 1,332* 1,433* 1,403*    Radiology    CT CHEST WO CONTRAST  Result Date: 11/16/2019 CLINICAL DATA:  86 year old female with chest pain. EXAM: CT CHEST WITHOUT CONTRAST TECHNIQUE: Multidetector CT imaging of the chest was performed following the standard protocol without IV contrast. COMPARISON:  Chest radiograph dated 09/05/2016 FINDINGS: Evaluation of this exam is limited in the absence of intravenous contrast. Cardiovascular: There is mild cardiomegaly. No pericardial effusion. Coronary vascular calcifications noted. There is mild atherosclerotic calcification of the thoracic aorta. Evaluation is limited on this noncontrast CT. The ascending aorta measures up to 4 cm in diameter. The central pulmonary arteries are grossly unremarkable. Mediastinum/Nodes: No hilar or mediastinal adenopathy. The esophagus is grossly unremarkable. No mediastinal fluid collection. Status post prior right hemithyroidectomy. Lungs/Pleura: Trace bilateral  pleural effusions. Focal area of subpleural scarring/thickening along the medial right upper lobe and right apex. Pneumonia is less likely but not excluded. No pneumothorax. The central airways are patent. Upper Abdomen: No acute abnormality. Musculoskeletal: Degenerative changes of the spine. No acute osseous pathology. IMPRESSION: 1. Trace bilateral pleural effusions. 2. Focal area of subpleural scarring/thickening along the medial right upper lobe and right apex. Pneumonia is less likely but not excluded. 3. Mild cardiomegaly with coronary vascular calcifications. 4. Borderline enlarged ascending aorta measuring 4 cm in diameter. Recommend annual imaging followup by CTA or MRA. This recommendation follows 2010 ACCF/AHA/AATS/ACR/ASA/SCA/SCAI/SIR/STS/SVM Guidelines for the Diagnosis and Management of Patients with Thoracic Aortic Disease. Circulation. 2010; [id number]. Aortic aneurysm NOS (ICD10-I71.9) 5. Aortic Atherosclerosis (ICD10-I70.0). Electronically Signed   By: Anner Crete M.D.   On: 11/16/2019 01:09   CT ABDOMEN PELVIS W CONTRAST  Result Date: 11/15/2019 CLINICAL DATA:  Right upper quadrant abdominal pain. Nausea and vomiting. History of tubular adenoma of the colon EXAM: CT ABDOMEN AND PELVIS WITH CONTRAST TECHNIQUE: Multidetector CT imaging of the abdomen and pelvis was performed using the standard protocol following bolus administration of intravenous contrast. CONTRAST:  131mL OMNIPAQUE IOHEXOL 300 MG/ML  SOLN COMPARISON:  January 15, 2008 FINDINGS: Lower chest: There are trace bilateral pleural effusions, right greater than left.There is significant cardiomegaly. Hepatobiliary: The liver is normal. Status post cholecystectomy.There is no biliary ductal dilation. Pancreas: Normal contours without ductal dilatation. No peripancreatic fluid collection. Spleen: Unremarkable. Adrenals/Urinary Tract: --Adrenal glands: Unremarkable. --Right kidney/ureter: No hydronephrosis or radiopaque kidney  stones. --Left kidney/ureter: No hydronephrosis or radiopaque kidney stones. --Urinary bladder: Unremarkable. Stomach/Bowel: --Stomach/Duodenum: No hiatal hernia or other gastric abnormality. Normal duodenal course and caliber. --Small bowel: Unremarkable. --Colon: Rectosigmoid diverticulosis without acute inflammation. --Appendix: Normal. Vascular/Lymphatic: Atherosclerotic calcification is present within the non-aneurysmal abdominal aorta, without hemodynamically significant stenosis. 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 The IMA is patent. --No retroperitoneal lymphadenopathy. --No mesenteric lymphadenopathy. --No pelvic or inguinal lymphadenopathy. Reproductive: Unremarkable Other: No ascites or free air. The abdominal wall is normal. Musculoskeletal. There is a chronic appearing L1 compression fracture with mild retropulsion. There is no new acute displaced fracture. The patient is status post prior intramedullary nail placement through the left femur. IMPRESSION: 1. No acute abdominopelvic abnormality. 2. Rectosigmoid diverticulosis without acute inflammation. 3. Cardiomegaly and trace bilateral pleural effusions, right greater than left. Aortic Atherosclerosis (ICD10-I70.0). Electronically Signed   By: Constance Holster M.D.   On: 11/15/2019 19:58   ECHOCARDIOGRAM COMPLETE  Result Date: 11/16/2019    ECHOCARDIOGRAM REPORT   Patient Name:  [name] Date of Exam: 11/16/2019 Medical Rec #:  [id number]         Height:       62.0 in Accession #:    [id number]        Weight:       150.0 lb Date of Birth:  07/14/1929         BSA:          1.692 m Patient Age:    58 years          BP:           155/66 mmHg Patient Gender: F                 HR:           75 bpm. Exam Location:  [facility] Procedure: 2D Echo Indications:    NSTEMI I21.4  History:        Patient has prior history of Echocardiogram examinations, most                 recent 08/29/2009. Arrythmias:Atrial  Fibrillation,                 Signs/Symptoms:Chest Pain; Risk Factors:Hypertension, Diabetes,                 Dyslipidemia and Non-Smoker. Closed intertrochanteric fracture                 of left hip, initial encounter.  Sonographer:    Leavy Cella RDCS (AE) Referring Phys: [place]  1. Left ventricular ejection fraction, by estimation, is 35 to 40%. The left ventricle has moderately decreased function. The left ventricle demonstrates global hypokinesis. The left ventricular internal cavity size was mildly dilated. There is mild left ventricular hypertrophy. Left ventricular diastolic parameters are consistent with Grade I diastolic dysfunction (impaired relaxation).  2. Right ventricular systolic function is normal. The right ventricular size is normal. Tricuspid regurgitation signal is inadequate for assessing PA pressure.  3. Left atrial size was moderately dilated.  4. The mitral valve is grossly normal. Mild mitral valve regurgitation.  5. The aortic valve is tricuspid. Aortic valve regurgitation is mild.  6. The inferior vena cava is normal in size with greater than 50% respiratory variability, suggesting right atrial pressure of 3 mmHg. FINDINGS  Left Ventricle: Left ventricular ejection fraction, by estimation, is 35 to 40%. The left ventricle has moderately decreased function. The left ventricle demonstrates global hypokinesis. The left ventricular internal cavity size was mildly dilated. There is mild left ventricular hypertrophy. Left ventricular diastolic parameters are consistent with Grade I diastolic dysfunction (impaired relaxation). Right Ventricle: The right ventricular size is normal. No increase in right ventricular wall thickness. Right ventricular systolic function is normal. Tricuspid regurgitation signal is inadequate for assessing PA pressure. Left Atrium: Left atrial size was moderately dilated. Right Atrium: Right atrial size was normal in size. Pericardium: There  is no evidence of pericardial effusion. Presence of pericardial fat pad. Mitral Valve: The mitral valve is grossly normal. Mild to moderate mitral annular calcification. Mild mitral valve regurgitation. Tricuspid Valve: The tricuspid valve is grossly normal. Tricuspid valve regurgitation is trivial. Aortic Valve: The aortic valve is tricuspid. Aortic valve regurgitation is mild. Pulmonic Valve: The pulmonic valve was grossly normal. Pulmonic valve regurgitation is trivial. Aorta: The aortic root is normal in size and structure. Venous: The inferior vena cava was not well visualized. The inferior vena cava is normal in size with  greater than 50% respiratory variability, suggesting right atrial pressure of 3 mmHg. IAS/Shunts: No atrial level shunt detected by color flow Doppler. Rozann Lesches MD Electronically signed by Rozann Lesches MD Signature Date/Time: 11/16/2019/10:42:14 AM    Final     Patient Profile     86 y.o. female with a reported history of apparent PAF, hypertension, hyperlipidemia, and dementia, resident of [facility], now presenting with nausea and emesis in association with chest and back discomfort.  High-sensitivity troponin I levels are consistent with suspected type II NSTEMI and she has a subsequently documented cardiomyopathy that is to be managed medically.  Assessment & Plan    1.  NSTEMI, likely type II event.  No recurrent chest pain at this time.  Plan is to manage medically on aspirin, beta-blocker and statin therapy.  2.  Secondary cardiomyopathy, LVEF 35 to 40% range with overall diffuse hypokinesis.  Initiated on Aldactone, losartan, and Coreg yesterday.  3.  Essential hypertension, blood pressure still not optimally controlled.  Discussed with patient and son in room.  Continue to titrate medications with plan for conservative management overall.  No additional ischemic testing is anticipated.  Continue aspirin, Coreg, Zocor, Aldactone, increase Cozaar to 50 mg daily,  add Imdur.  Supplement potassium.  Anticipate discharge back to [facility] soon as long as she remains symptomatically stable and blood pressure trend shows further improvement.  Signed, Rozann Lesches, MD  11/17/2019, 9:30 AM

## 2019-11-17 NOTE — Plan of Care (Signed)
  Problem: Acute Rehab PT Goals(only PT should resolve) Goal: Pt Will Go Supine/Side To Sit Outcome: Progressing Flowsheets (Taken 11/17/2019 1111) Pt will go Supine/Side to Sit:  with modified independence  with supervision Goal: Patient Will Transfer Sit To/From Stand Outcome: Progressing Flowsheets (Taken 11/17/2019 1111) Patient will transfer sit to/from stand: with supervision Goal: Pt Will Transfer Bed To Chair/Chair To Bed Outcome: Progressing Flowsheets (Taken 11/17/2019 1111) Pt will Transfer Bed to Chair/Chair to Bed: with supervision Goal: Pt Will Ambulate Outcome: Progressing Flowsheets (Taken 11/17/2019 1111) Pt will Ambulate:  50 feet  with min guard assist  with minimal assist  with rolling walker   11:12 AM, 11/17/19 Lonell Grandchild, MPT Physical Therapist with [facility] [phone] office [phone] mobile phone

## 2019-11-18 DIAGNOSIS — F0390 Unspecified dementia without behavioral disturbance: Secondary | ICD-10-CM | POA: Diagnosis not present

## 2019-11-18 DIAGNOSIS — I429 Cardiomyopathy, unspecified: Secondary | ICD-10-CM | POA: Diagnosis not present

## 2019-11-18 DIAGNOSIS — I712 Thoracic aortic aneurysm, without rupture: Secondary | ICD-10-CM | POA: Diagnosis not present

## 2019-11-18 DIAGNOSIS — I480 Paroxysmal atrial fibrillation: Secondary | ICD-10-CM | POA: Diagnosis not present

## 2019-11-18 LAB — CBC
HCT: 28.3 % — ABNORMAL LOW (ref 36.0–46.0)
Hemoglobin: 9.6 g/dL — ABNORMAL LOW (ref 12.0–15.0)
MCH: 24.9 pg — ABNORMAL LOW (ref 26.0–34.0)
MCHC: 33.9 g/dL (ref 30.0–36.0)
MCV: 73.5 fL — ABNORMAL LOW (ref 80.0–100.0)
Platelets: 243 10*3/uL (ref 150–400)
RBC: 3.85 MIL/uL — ABNORMAL LOW (ref 3.87–5.11)
RDW: 20.9 % — ABNORMAL HIGH (ref 11.5–15.5)
WBC: 10.2 10*3/uL (ref 4.0–10.5)
nRBC: 0.2 % (ref 0.0–0.2)

## 2019-11-18 LAB — BASIC METABOLIC PANEL
Anion gap: 9 (ref 5–15)
BUN: 14 mg/dL (ref 8–23)
CO2: 27 mmol/L (ref 22–32)
Calcium: 8.4 mg/dL — ABNORMAL LOW (ref 8.9–10.3)
Chloride: 103 mmol/L (ref 98–111)
Creatinine, Ser: 0.63 mg/dL (ref 0.44–1.00)
GFR calc Af Amer: >60 mL/min (ref >60)
GFR calc non Af Amer: >60 mL/min (ref >60)
Glucose, Bld: 130 mg/dL — ABNORMAL HIGH (ref 70–99)
Potassium: 3.8 mmol/L (ref 3.5–5.1)
Sodium: 139 mmol/L (ref 135–145)

## 2019-11-18 MED ORDER — CARVEDILOL 6.25 MG PO TABS: 6.2500 mg | ORAL_TABLET | Freq: Two times a day (BID) | ORAL | 3 refills | Status: DC

## 2019-11-18 MED ORDER — TAMSULOSIN HCL 0.4 MG PO CAPS: 0.4000 mg | ORAL_CAPSULE | Freq: Every day | ORAL | 0 refills | Status: AC

## 2019-11-18 MED ORDER — ASPIRIN 81 MG PO TBEC: 81.0000 mg | DELAYED_RELEASE_TABLET | Freq: Every day | ORAL | 2 refills | Status: DC

## 2019-11-18 MED ORDER — POLYSACCHARIDE IRON COMPLEX 150 MG PO CAPS: 150.0000 mg | ORAL_CAPSULE | Freq: Two times a day (BID) | ORAL | 4 refills | Status: DC

## 2019-11-18 MED ORDER — TAMSULOSIN HCL 0.4 MG PO CAPS: 0.4000 mg | ORAL_CAPSULE | Freq: Every day | ORAL | 0 refills | Status: DC

## 2019-11-18 MED ORDER — SIMVASTATIN 40 MG PO TABS: 40.0000 mg | ORAL_TABLET | Freq: Every evening | ORAL | 3 refills | Status: AC

## 2019-11-18 MED ORDER — SIMVASTATIN 40 MG PO TABS: 40.0000 mg | ORAL_TABLET | Freq: Every evening | ORAL | 3 refills | Status: DC

## 2019-11-18 MED ORDER — ACETAMINOPHEN 325 MG PO TABS: 650.0000 mg | ORAL_TABLET | Freq: Four times a day (QID) | ORAL | 1 refills | Status: AC | PRN

## 2019-11-18 MED ORDER — DONEPEZIL HCL 5 MG PO TABS: 5.0000 mg | ORAL_TABLET | Freq: Every day | ORAL | 3 refills | Status: AC

## 2019-11-18 MED ORDER — ISOSORBIDE MONONITRATE ER 30 MG PO TB24: 15.0000 mg | ORAL_TABLET | Freq: Every evening | ORAL | 3 refills | Status: DC

## 2019-11-18 MED ORDER — LOSARTAN POTASSIUM 50 MG PO TABS: 50.0000 mg | ORAL_TABLET | Freq: Every day | ORAL | 3 refills | Status: DC

## 2019-11-18 MED ORDER — MEMANTINE HCL 10 MG PO TABS: 10.0000 mg | ORAL_TABLET | Freq: Two times a day (BID) | ORAL | 3 refills | Status: AC

## 2019-11-18 MED ORDER — SENNOSIDES-DOCUSATE SODIUM 8.6-50 MG PO TABS: 2.0000 | ORAL_TABLET | Freq: Every day | ORAL | 11 refills | Status: AC

## 2019-11-18 MED ORDER — SPIRONOLACTONE 25 MG PO TABS: 12.5000 mg | ORAL_TABLET | Freq: Every day | ORAL | 3 refills | Status: DC

## 2019-11-18 MED ORDER — ISOSORBIDE MONONITRATE ER 30 MG PO TB24: 15.0000 mg | ORAL_TABLET | Freq: Every evening | ORAL | 3 refills | Status: AC

## 2019-11-18 MED ORDER — SPIRONOLACTONE 25 MG PO TABS: 12.5000 mg | ORAL_TABLET | Freq: Every day | ORAL | 3 refills | Status: AC

## 2019-11-18 NOTE — Progress Notes (Signed)
Nsg Discharge Note  Admit Date:  11/15/2019 Discharge date: 11/18/2019   [name] to be D/C'd to [facility] per MD order.  AVS completed.  Copy for chart, and copy for patient signed, and dated. Patient/caregiver able to verbalize understanding.  Discharge Medication: Allergies as of 11/18/2019      Reactions   Bee Venom    Listed per Nursing Center-No reaction is noted   Carbapenems    Listed per Nursing Center-No reaction is noted   Cephalosporins    Listed per Nursing Center-No reaction is noted   Procaine Hcl Other (See Comments)   Numbness/jerkiness   Sulfonamide Derivatives Other (See Comments)   hypertension   Penicillins Rash   Has patient had a PCN reaction causing immediate rash, facial/tongue/throat swelling, SOB or lightheadedness with hypotension:YES Has patient had a PCN reaction causing severe rash involving mucus membranes or skin necrosis:NO Has patient had a PCN reaction that required hospitalizationNO Has patient had a PCN reaction occurring within the last 10 years: NO If all of the above answers are "NO", then may proceed with Cephalosporin use.      Medication List    STOP taking these medications   docusate sodium 100 MG capsule Commonly known as: COLACE   metoprolol tartrate 25 MG tablet Commonly known as: LOPRESSOR     TAKE these medications   acetaminophen 325 MG tablet Commonly known as: TYLENOL Take 2 tablets (650 mg total) by mouth every 6 (six) hours as needed for mild pain, fever or headache.   alendronate 70 MG tablet Commonly known as: FOSAMAX Take 70 mg by mouth once a week. Take with a full glass of water on an empty stomach. Take on Wednesdays   aspirin 81 MG EC tablet Take 1 tablet (81 mg total) by mouth daily with breakfast.   calcium citrate-vitamin D 500-400 MG-UNIT chewable tablet Chew by mouth 2 (two) times daily.   carvedilol 6.25 MG tablet Commonly known as: COREG Take 1 tablet (6.25 mg total) by mouth 2  (two) times daily with a meal.   Cranberry 450 MG Tabs Take 450 mg by mouth 2 (two) times daily.   cycloSPORINE 0.05 % ophthalmic emulsion Commonly known as: RESTASIS Place 1 drop into both eyes 2 (two) times daily.   donepezil 5 MG tablet Commonly known as: ARICEPT Take 1 tablet (5 mg total) by mouth at bedtime.   fish oil-omega-3 fatty acids 1000 MG capsule Take 2 g by mouth daily.   iron polysaccharides 150 MG capsule Commonly known as: NIFEREX Take 1 capsule (150 mg total) by mouth 2 (two) times daily.   isosorbide mononitrate 30 MG 24 hr tablet Commonly known as: IMDUR Take 0.5 tablets (15 mg total) by mouth every evening.   lidocaine 5 % Commonly known as: Lidoderm Place 1 patch onto the skin daily. Remove & Discard patch within 12 hours or as directed by MD What changed: additional instructions   losartan 50 MG tablet Commonly known as: COZAAR Take 1 tablet (50 mg total) by mouth daily. Start taking on: Nov 19, 2019   meclizine 12.5 MG tablet Commonly known as: ANTIVERT Take 12.5 mg by mouth every 6 (six) hours as needed for dizziness.   memantine 10 MG tablet Commonly known as: NAMENDA Take 1 tablet (10 mg total) by mouth 2 (two) times daily.   nortriptyline 25 MG capsule Commonly known as: PAMELOR Take 25 mg by mouth at bedtime.   senna-docusate 8.6-50 MG tablet Commonly known as:  Senokot-S Take 2 tablets by mouth at bedtime.   simvastatin 40 MG tablet Commonly known as: ZOCOR Take 1 tablet (40 mg total) by mouth every evening.   spironolactone 25 MG tablet Commonly known as: ALDACTONE Take 0.5 tablets (12.5 mg total) by mouth daily. Start taking on: Nov 19, 2019   tamsulosin 0.4 MG Caps capsule Commonly known as: FLOMAX Take 1 capsule (0.4 mg total) by mouth daily after supper.       Discharge Assessment: Vitals:   11/18/19 0508 11/18/19 1256  BP: (!) 151/66 (!) 161/69  Pulse: 73 73  Resp: 18 20  Temp: 98.6 F (37 C) 97.8 F (36.6 C)   SpO2: 90% 95%   Skin clean, dry and intact without evidence of skin break down, no evidence of skin tears noted. IV catheter discontinued intact. Site without signs and symptoms of complications - no redness or edema noted at insertion site, patient denies c/o pain - only slight tenderness at site.  Dressing with slight pressure applied.  D/c Instructions-Education: Discharge instructions given to patient/family with verbalized understanding. D/c education completed with patient/family including follow up instructions, medication list, d/c activities limitations if indicated, with other d/c instructions as indicated by MD - patient able to verbalize understanding, all questions fully answered. Patient instructed to return to ED, call 911, or call MD for any changes in condition.  Patient escorted via [place], and D/C to [facility] via private auto.  Zachery Conch, RN 11/18/2019 4:20 PM

## 2019-11-18 NOTE — Discharge Summary (Addendum)
[name], is a 86 y.o. female  DOB 1929-02-17  MRN [id number].  Admission date:  11/15/2019  Admitting Physician  Oswald Hillock, MD  Discharge Date:  11/18/2019   Primary MD  Redmond School, MD  Recommendations for primary care physician for things to follow:    1)Very low-salt diet advised 2)Weigh yourself daily, call if you gain more than 3 pounds in 1 day or more than 5 pounds in 1 week as your diuretic medications may need to be adjusted 3)Limit your Fluid  intake to no more than 60 ounces (1.8 Liters) per day 4)Avoid ibuprofen/Advil/Aleve/Motrin/Goody Powders/Naproxen/BC powders/Meloxicam/Diclofenac/Indomethacin and other Nonsteroidal anti-inflammatory medications as these will make you more likely to bleed and can cause stomach ulcers, can also cause Kidney problems.   Admission Diagnosis  NSTEMI (non-ST elevated myocardial infarction) ([place]) [I21.4] Pleural effusion, bilateral [J90] Chest pain [R07.9]   Discharge Diagnosis  NSTEMI (non-ST elevated myocardial infarction) ([place]) [I21.4] Pleural effusion, bilateral [J90] Chest pain [R07.9]    Active Problems:   Diabetes mellitus due to underlying condition, controlled ([place])   HYPERCHOLESTEROLEMIA   Essential hypertension   PAROXYSMAL ATRIAL FIBRILLATION   Dementia (HCC)   Chest pain   NSTEMI (non-ST elevated myocardial infarction) ([place])   Cardiomyopathy ([place])   Ascending aortic aneurysm ([place])      Past Medical History:  Diagnosis Date  . Atrial fibrillation ([place])   . Dementia ([place])   . Diverticulitis 8/09, 3/10   Abscess in 2009 (medical tx)  . Essential hypertension   . Hyperlipidemia   . Hyperthyroidism    Radiation, surgery  . IDA (iron deficiency anemia)   . Osteoporosis   . Postoperative anemia due to acute blood loss 09/08/2016  . Thalassemia   . Tubular adenoma of colon 12/15/08   Colonosocpy Dr Tonita Cong removed cecum   . Type 2 diabetes mellitus ([place])     Past Surgical History:  Procedure Laterality Date  . CESAREAN SECTION     x2  . CHOLECYSTECTOMY    . FOOT SURGERY     bilat  . INTRAMEDULLARY (IM) NAIL INTERTROCHANTERIC Left 09/06/2016   Procedure: LEFT HIP INTRAMEDULLARY (IM) NAIL;  Surgeon: Renette Butters, MD;  Location: [facility];  Service: Orthopedics;  Laterality: Left;  . THYROIDECTOMY, PARTIAL       HPI  from the history and physical done on the day of admission:      [name]  is a 86 y.o. female, with medical history of atrial fibrillation not on anticoagulation, dementia, diabetes mellitus type 2, hypertension, hyperlipidemia, osteoporosis was brought to the ED from [facility] assisted facility for evaluation of vomiting.  Patient was seen in the hospital 3 days ago at that time her work-up was reassuring and was found to stable to discharge back to the facility.  Patient had decreased appetite, has been more tired and had 2 episodes of nonbloody nonbilious emesis.  She also complained of chest pain with radiation to upper back in the ED.  In the ED patient was  found to be in hypertensive with elevated troponin I 332.  Cardiology fellow was consulted by ED provider who recommended starting her on heparin anticoagulation. Patient denies shortness of breath. Denies nausea or vomiting at this time. Denies abdominal pain or dysuria.     Hospital Course:      Brief summary 86 year old female with a history of paroxysmal atrial fibrillation, hypertension, dementia who is a resident of an assisted living facility, was admitted to the hospital with chest discomfort radiating into her back.  CT chest did not show any evidence of dissection.  Abdominal CT was also unrevealing.  She was noted to have positive troponin with a peak of 1433.  No acute EKG changes.  Patient was admitted for further management of non-STEMI.   Assessment & Plan:   Active Problems:   Diabetes mellitus due to  underlying condition, controlled ([place])   HYPERCHOLESTEROLEMIA   Essential hypertension   PAROXYSMAL ATRIAL FIBRILLATION   Dementia (HCC)   Chest pain   NSTEMI (non-ST elevated myocardial infarction) ([place])   Cardiomyopathy ([place])   Ascending aortic aneurysm (HCC)   A/p 1)NSTEMI-- Seen by cardiology and felt to be type II MI.  Medical management advised, currently chest pain-free, troponins trending down. -Cardiology consult appreciated -No ischemic testing planned -Continue aspirin 81 mg daily, Zocor 40 mg daily, Coreg 6.25 mg twice daily, Cozaar be increased to 50 mg daily, continue Aldactone 12.5 mg daily, isosorbide 15 mg daily and potassium added  2) secondary cardiomyopathy.- EF 35 to 40% with global hypokinesis.  Likely nonischemic cardiomyopathy.  Medication adjustments as above in #1  3)Paroxysmal atrial fibrillation.  Currently in sinus rhythm.  Heart rate is stable.  CHA2DS2-VASc score of 6.  Not on anticoagulation due to history of falls  4)Hypertension.  Blood pressures currently stable-cardiac medications as above #1  5)Dementia- at least moderate cognitive deficits at baseline stable.  Continue Aricept and Namenda.  6)Ascending aortic aneurysm.  Currently measuring 4 cm.  Recommended annual surveillance.  7) leukocytosis--- no fevers, urine culture NGTD, WBC normalized at 10.2  8) urinary retention--- improved with Flomax, patient able to void after removal of Foley catheter   DVT prophylaxis: Lovenox Code Status: DNR Family Communication: Discussed with sons Christy Sartorius and david at the bedside Disposition Plan: Discharge to [facility] with home health care   Consultants:   Cardiology  Procedures:   Echo: Ejection fraction of 35 to 40% with global hypokinesis  Antimicrobials:      Discharge Condition: Stable, Follow UP  Follow-up Information    Erma Heritage, PA-C On 12/30/2019.   Specialties: Librarian, academic, Cardiology Why: at  1:00 pm (Virtual phone call) Contact information: [address] [place] [address] [phone]            Diet and Activity recommendation:  As advised  Discharge Instructions    Discharge Instructions    Call MD for:  difficulty breathing, headache or visual disturbances   Complete by: As directed    Call MD for:  extreme fatigue   Complete by: As directed    Call MD for:  persistant dizziness or light-headedness   Complete by: As directed    Call MD for:  persistant nausea and vomiting   Complete by: As directed    Call MD for:  temperature >100.4   Complete by: As directed    Diet - low sodium heart healthy   Complete by: As directed    Discharge instructions   Complete by: As  directed    1)Very low-salt diet advised 2)Weigh yourself daily, call if you gain more than 3 pounds in 1 day or more than 5 pounds in 1 week as your diuretic medications may need to be adjusted 3)Limit your Fluid  intake to no more than 60 ounces (1.8 Liters) per day 4)Avoid ibuprofen/Advil/Aleve/Motrin/Goody Powders/Naproxen/BC powders/Meloxicam/Diclofenac/Indomethacin and other Nonsteroidal anti-inflammatory medications as these will make you more likely to bleed and can cause stomach ulcers, can also cause Kidney problems.   Increase activity slowly   Complete by: As directed         Discharge Medications     Allergies as of 11/18/2019      Reactions   Bee Venom    Listed per Nursing Center-No reaction is noted   Carbapenems    Listed per Nursing Center-No reaction is noted   Cephalosporins    Listed per Nursing Center-No reaction is noted   Procaine Hcl Other (See Comments)   Numbness/jerkiness   Sulfonamide Derivatives Other (See Comments)   hypertension   Penicillins Rash   Has patient had a PCN reaction causing immediate rash, facial/tongue/throat swelling, SOB or lightheadedness with hypotension:YES Has patient had a PCN reaction causing severe rash involving mucus membranes  or skin necrosis:NO Has patient had a PCN reaction that required hospitalizationNO Has patient had a PCN reaction occurring within the last 10 years: NO If all of the above answers are "NO", then may proceed with Cephalosporin use.      Medication List    STOP taking these medications   docusate sodium 100 MG capsule Commonly known as: COLACE   metoprolol tartrate 25 MG tablet Commonly known as: LOPRESSOR     TAKE these medications   acetaminophen 325 MG tablet Commonly known as: TYLENOL Take 2 tablets (650 mg total) by mouth every 6 (six) hours as needed for mild pain, fever or headache.   alendronate 70 MG tablet Commonly known as: FOSAMAX Take 70 mg by mouth once a week. Take with a full glass of water on an empty stomach. Take on Wednesdays   aspirin 81 MG EC tablet Take 1 tablet (81 mg total) by mouth daily with breakfast.   calcium citrate-vitamin D 500-400 MG-UNIT chewable tablet Chew by mouth 2 (two) times daily.   carvedilol 6.25 MG tablet Commonly known as: COREG Take 1 tablet (6.25 mg total) by mouth 2 (two) times daily with a meal.   Cranberry 450 MG Tabs Take 450 mg by mouth 2 (two) times daily.   cycloSPORINE 0.05 % ophthalmic emulsion Commonly known as: RESTASIS Place 1 drop into both eyes 2 (two) times daily.   donepezil 5 MG tablet Commonly known as: ARICEPT Take 1 tablet (5 mg total) by mouth at bedtime.   fish oil-omega-3 fatty acids 1000 MG capsule Take 2 g by mouth daily.   iron polysaccharides 150 MG capsule Commonly known as: NIFEREX Take 1 capsule (150 mg total) by mouth 2 (two) times daily.   isosorbide mononitrate 30 MG 24 hr tablet Commonly known as: IMDUR Take 0.5 tablets (15 mg total) by mouth every evening.   lidocaine 5 % Commonly known as: Lidoderm Place 1 patch onto the skin daily. Remove & Discard patch within 12 hours or as directed by MD What changed: additional instructions   losartan 50 MG tablet Commonly known as:  COZAAR Take 1 tablet (50 mg total) by mouth daily. Start taking on: Nov 19, 2019   meclizine 12.5 MG tablet Commonly known as: Cabin crew  Take 12.5 mg by mouth every 6 (six) hours as needed for dizziness.   memantine 10 MG tablet Commonly known as: NAMENDA Take 1 tablet (10 mg total) by mouth 2 (two) times daily.   nortriptyline 25 MG capsule Commonly known as: PAMELOR Take 25 mg by mouth at bedtime.   senna-docusate 8.6-50 MG tablet Commonly known as: Senokot-S Take 2 tablets by mouth at bedtime.   simvastatin 40 MG tablet Commonly known as: ZOCOR Take 1 tablet (40 mg total) by mouth every evening.   spironolactone 25 MG tablet Commonly known as: ALDACTONE Take 0.5 tablets (12.5 mg total) by mouth daily. Start taking on: Nov 19, 2019   tamsulosin 0.4 MG Caps capsule Commonly known as: FLOMAX Take 1 capsule (0.4 mg total) by mouth daily after supper.      Major procedures and Radiology Reports - PLEASE review detailed and final reports for all details, in brief -   DG Ribs Unilateral W/Chest Right  Result Date: 11/12/2019 CLINICAL DATA:  Right-sided rib pain, no known injury, initial encounter EXAM: RIGHT RIBS AND CHEST - 3+ VIEW COMPARISON:  09/05/2016 FINDINGS: Cardiac shadow is within normal limits. Aortic calcifications are seen. Mild interstitial changes are again identified. No rib fractures are noted IMPRESSION: No rib fracture is noted. Electronically Signed   By: Inez Catalina M.D.   On: 11/12/2019 18:42   CT CHEST WO CONTRAST  Result Date: 11/16/2019 CLINICAL DATA:  86 year old female with chest pain. EXAM: CT CHEST WITHOUT CONTRAST TECHNIQUE: Multidetector CT imaging of the chest was performed following the standard protocol without IV contrast. COMPARISON:  Chest radiograph dated 09/05/2016 FINDINGS: Evaluation of this exam is limited in the absence of intravenous contrast. Cardiovascular: There is mild cardiomegaly. No pericardial effusion. Coronary vascular  calcifications noted. There is mild atherosclerotic calcification of the thoracic aorta. Evaluation is limited on this noncontrast CT. The ascending aorta measures up to 4 cm in diameter. The central pulmonary arteries are grossly unremarkable. Mediastinum/Nodes: No hilar or mediastinal adenopathy. The esophagus is grossly unremarkable. No mediastinal fluid collection. Status post prior right hemithyroidectomy. Lungs/Pleura: Trace bilateral pleural effusions. Focal area of subpleural scarring/thickening along the medial right upper lobe and right apex. Pneumonia is less likely but not excluded. No pneumothorax. The central airways are patent. Upper Abdomen: No acute abnormality. Musculoskeletal: Degenerative changes of the spine. No acute osseous pathology. IMPRESSION: 1. Trace bilateral pleural effusions. 2. Focal area of subpleural scarring/thickening along the medial right upper lobe and right apex. Pneumonia is less likely but not excluded. 3. Mild cardiomegaly with coronary vascular calcifications. 4. Borderline enlarged ascending aorta measuring 4 cm in diameter. Recommend annual imaging followup by CTA or MRA. This recommendation follows 2010 ACCF/AHA/AATS/ACR/ASA/SCA/SCAI/SIR/STS/SVM Guidelines for the Diagnosis and Management of Patients with Thoracic Aortic Disease. Circulation. 2010; [id number]. Aortic aneurysm NOS (ICD10-I71.9) 5. Aortic Atherosclerosis (ICD10-I70.0). Electronically Signed   By: Anner Crete M.D.   On: 11/16/2019 01:09   CT ABDOMEN PELVIS W CONTRAST  Result Date: 11/15/2019 CLINICAL DATA:  Right upper quadrant abdominal pain. Nausea and vomiting. History of tubular adenoma of the colon EXAM: CT ABDOMEN AND PELVIS WITH CONTRAST TECHNIQUE: Multidetector CT imaging of the abdomen and pelvis was performed using the standard protocol following bolus administration of intravenous contrast. CONTRAST:  162mL OMNIPAQUE IOHEXOL 300 MG/ML  SOLN COMPARISON:  January 15, 2008 FINDINGS: Lower  chest: There are trace bilateral pleural effusions, right greater than left.There is significant cardiomegaly. Hepatobiliary: The liver is normal. Status post cholecystectomy.There is no  biliary ductal dilation. Pancreas: Normal contours without ductal dilatation. No peripancreatic fluid collection. Spleen: Unremarkable. Adrenals/Urinary Tract: --Adrenal glands: Unremarkable. --Right kidney/ureter: No hydronephrosis or radiopaque kidney stones. --Left kidney/ureter: No hydronephrosis or radiopaque kidney stones. --Urinary bladder: Unremarkable. Stomach/Bowel: --Stomach/Duodenum: No hiatal hernia or other gastric abnormality. Normal duodenal course and caliber. --Small bowel: Unremarkable. --Colon: Rectosigmoid diverticulosis without acute inflammation. --Appendix: Normal. Vascular/Lymphatic: Atherosclerotic calcification is present within the non-aneurysmal abdominal aorta, without hemodynamically significant stenosis. 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 The IMA is patent. --No retroperitoneal lymphadenopathy. --No mesenteric lymphadenopathy. --No pelvic or inguinal lymphadenopathy. Reproductive: Unremarkable Other: No ascites or free air. The abdominal wall is normal. Musculoskeletal. There is a chronic appearing L1 compression fracture with mild retropulsion. There is no new acute displaced fracture. The patient is status post prior intramedullary nail placement through the left femur. IMPRESSION: 1. No acute abdominopelvic abnormality. 2. Rectosigmoid diverticulosis without acute inflammation. 3. Cardiomegaly and trace bilateral pleural effusions, right greater than left. Aortic Atherosclerosis (ICD10-I70.0). Electronically Signed   By: Constance Holster M.D.   On: 11/15/2019 19:58   ECHOCARDIOGRAM COMPLETE  Result Date: 11/16/2019    ECHOCARDIOGRAM REPORT   Patient Name:   ANAHIA KULINSKI Date of Exam: 11/16/2019 Medical Rec #:  [id number]         Height:       62.0 in  Accession #:    [id number]        Weight:       150.0 lb Date of Birth:  12/26/1928         BSA:          1.692 m Patient Age:    53 years          BP:           155/66 mmHg Patient Gender: F                 HR:           75 bpm. Exam Location:  [facility] Procedure: 2D Echo Indications:    NSTEMI I21.4  History:        Patient has prior history of Echocardiogram examinations, most                 recent 08/29/2009. Arrythmias:Atrial Fibrillation,                 Signs/Symptoms:Chest Pain; Risk Factors:Hypertension, Diabetes,                 Dyslipidemia and Non-Smoker. Closed intertrochanteric fracture                 of left hip, initial encounter.  Sonographer:    Leavy Cella RDCS (AE) Referring Phys: [facility]  1. Left ventricular ejection fraction, by estimation, is 35 to 40%. The left ventricle has moderately decreased function. The left ventricle demonstrates global hypokinesis. The left ventricular internal cavity size was mildly dilated. There is mild left ventricular hypertrophy. Left ventricular diastolic parameters are consistent with Grade I diastolic dysfunction (impaired relaxation).  2. Right ventricular systolic function is normal. The right ventricular size is normal. Tricuspid regurgitation signal is inadequate for assessing PA pressure.  3. Left atrial size was moderately dilated.  4. The mitral valve is grossly normal. Mild mitral valve regurgitation.  5. The aortic valve is tricuspid. Aortic valve regurgitation is mild.  6. The inferior vena cava is normal in size with greater than  50% respiratory variability, suggesting right atrial pressure of 3 mmHg. FINDINGS  Left Ventricle: Left ventricular ejection fraction, by estimation, is 35 to 40%. The left ventricle has moderately decreased function. The left ventricle demonstrates global hypokinesis. The left ventricular internal cavity size was mildly dilated. There is mild left ventricular hypertrophy. Left ventricular  diastolic parameters are consistent with Grade I diastolic dysfunction (impaired relaxation). Right Ventricle: The right ventricular size is normal. No increase in right ventricular wall thickness. Right ventricular systolic function is normal. Tricuspid regurgitation signal is inadequate for assessing PA pressure. Left Atrium: Left atrial size was moderately dilated. Right Atrium: Right atrial size was normal in size. Pericardium: There is no evidence of pericardial effusion. Presence of pericardial fat pad. Mitral Valve: The mitral valve is grossly normal. Mild to moderate mitral annular calcification. Mild mitral valve regurgitation. Tricuspid Valve: The tricuspid valve is grossly normal. Tricuspid valve regurgitation is trivial. Aortic Valve: The aortic valve is tricuspid. Aortic valve regurgitation is mild. Pulmonic Valve: The pulmonic valve was grossly normal. Pulmonic valve regurgitation is trivial. Aorta: The aortic root is normal in size and structure. Venous: The inferior vena cava was not well visualized. The inferior vena cava is normal in size with greater than 50% respiratory variability, suggesting right atrial pressure of 3 mmHg. IAS/Shunts: No atrial level shunt detected by color flow Doppler. Rozann Lesches MD Electronically signed by Rozann Lesches MD Signature Date/Time: 11/16/2019/10:42:14 AM    Final    Micro Results   Recent Results (from the past 240 hour(s))  Urine culture     Status: None   Collection Time: 11/15/19  8:18 PM   Specimen: Urine, Random  Result Value Ref Range Status   Specimen Description   Final    URINE, RANDOM Performed at [facility], [address], [place], [place] [address]    Special Requests   Final    NONE Performed at [facility], [address], [place], [place] [address]    Culture   Final    NO GROWTH Performed at [facility], [place] [address], [place], [place] [address]    Report Status 11/17/2019 FINAL  Final  Respiratory Panel by RT  PCR (Flu A&B, Covid) - Nasopharyngeal Swab     Status: None   Collection Time: 11/15/19  9:57 PM   Specimen: Nasopharyngeal Swab  Result Value Ref Range Status   SARS Coronavirus 2 by RT PCR NEGATIVE NEGATIVE Final    Comment: (NOTE) SARS-CoV-2 target nucleic acids are NOT DETECTED. The SARS-CoV-2 RNA is generally detectable in upper respiratoy specimens during the acute phase of infection. The lowest concentration of SARS-CoV-2 viral copies this assay can detect is 131 copies/mL. A negative result does not preclude SARS-Cov-2 infection and should not be used as the sole basis for treatment or other patient management decisions. A negative result may occur with  improper specimen collection/handling, submission of specimen other than nasopharyngeal swab, presence of viral mutation(s) within the areas targeted by this assay, and inadequate number of viral copies (<131 copies/mL). A negative result must be combined with clinical observations, patient history, and epidemiological information. The expected result is Negative. Fact Sheet for Patients:  PinkCheek.be Fact Sheet for Healthcare Providers:  GravelBags.it This test is not yet ap proved or cleared by the [place] FDA and  has been authorized for detection and/or diagnosis of SARS-CoV-2 by FDA under an Emergency Use Authorization (EUA). This EUA will remain  in effect (meaning this test can be used)  for the duration of the COVID-19 declaration under Section 564(b)(1) of the Act, 21 U.S.C. section 360bbb-3(b)(1), unless the authorization is terminated or revoked sooner.    Influenza A by PCR NEGATIVE NEGATIVE Final   Influenza B by PCR NEGATIVE NEGATIVE Final    Comment: (NOTE) The Xpert Xpress SARS-CoV-2/FLU/RSV assay is intended as an aid in  the diagnosis of influenza from Nasopharyngeal swab specimens and  should not be used as a sole basis for treatment. Nasal  washings and  aspirates are unacceptable for Xpert Xpress SARS-CoV-2/FLU/RSV  testing. Fact Sheet for Patients: PinkCheek.be Fact Sheet for Healthcare Providers: GravelBags.it This test is not yet approved or cleared by the [place] FDA and  has been authorized for detection and/or diagnosis of SARS-CoV-2 by  FDA under an Emergency Use Authorization (EUA). This EUA will remain  in effect (meaning this test can be used) for the duration of the  Covid-19 declaration under Section 564(b)(1) of the Act, 21  U.S.C. section 360bbb-3(b)(1), unless the authorization is  terminated or revoked. Performed at [facility], [address] [place], [place] [address]   MRSA PCR Screening     Status: None   Collection Time: 11/16/19  1:52 AM   Specimen: Nasopharyngeal  Result Value Ref Range Status   MRSA by PCR NEGATIVE NEGATIVE Final    Comment:        The GeneXpert MRSA Assay (FDA approved for NASAL specimens only), is one component of a comprehensive MRSA colonization surveillance program. It is not intended to diagnose MRSA infection nor to guide or monitor treatment for MRSA infections. Performed at [facility], [address], [place], [place] [address]        Today   Subjective    Chala Kazemi today has no new complaints, -Voiding well after removal of Foley catheter -Patient sons Shanon Brow and Christy Sartorius at bedside          Patient has been seen and examined prior to discharge   Objective   Blood pressure (!) 161/69, pulse 73, temperature 97.8 F (36.6 C), resp. rate 20, height 5\' 2"  (1.575 m), weight 68 kg, SpO2 95 %.   Intake/Output Summary (Last 24 hours) at 11/18/2019 1641 Last data filed at 11/18/2019 1134 Gross per 24 hour  Intake 123 ml  Output 2600 ml  Net -2477 ml    Exam Gen:- Awake Alert, no acute distress , conversational HEENT:- [place].AT, No sclera icterus Neck-Supple Neck,No JVD,.  Lungs-  CTAB  , good air movement bilaterally  CV- S1, S2 normal, irregular Abd-  +ve B.Sounds, Abd Soft, No tenderness,    Extremity/Skin:- No  edema,   good pulses Psych-baseline cognitive and memory deficits, according to patient's son Shanon Brow and Christy Sartorius at bedside patient is back to baseline  neuro-generalized weakness ,no new focal deficits, no tremors    Data Review   CBC w Diff:  Lab Results  Component Value Date   WBC 10.2 11/18/2019   HGB 9.6 (L) 11/18/2019   HCT 28.3 (L) 11/18/2019   PLT 243 11/18/2019   LYMPHOPCT 10 11/15/2019   MONOPCT 11 11/15/2019   EOSPCT 0 11/15/2019   BASOPCT 0 11/15/2019    CMP:  Lab Results  Component Value Date   NA 139 11/18/2019   K 3.8 11/18/2019   CL 103 11/18/2019   CO2 27 11/18/2019   BUN 14 11/18/2019   CREATININE 0.63 11/18/2019   CREATININE 0.61 01/10/2011   PROT 6.4 (L) 11/16/2019   ALBUMIN 3.7 11/16/2019  BILITOT 1.6 (H) 11/16/2019   BILITOT 1.3 01/14/2011   ALKPHOS 51 11/16/2019   AST 36 11/16/2019   ALT 26 11/16/2019  .   Total Discharge time is about 33 minutes  Roxan Hockey M.D on 11/18/2019 at 4:41 PM  Go to [email or web address] -  for contact info  [facility] - Office  [phone]

## 2019-11-18 NOTE — TOC Transition Note (Signed)
Transition of Care [facility]) - CM/SW Discharge Note   Patient Details  Name: [name] MRN: [id number] Date of Birth: 02/16/1929  Transition of Care [facility]) CM/SW Contact:  Boneta Lucks, RN Phone Number: 11/18/2019, 5:22 PM   Clinical Narrative:   Patient discharging back to [facility]. RN called report, prior permission was given to family to transport. TOC completed FL2 and faxed to facility.     Final next level of care: Assisted Living Barriers to Discharge: Barriers Resolved   Patient Goals and CMS Choice Patient states their goals for this hospitalization and ongoing recovery are:: to go back to ALF.

## 2019-11-18 NOTE — Progress Notes (Signed)
   Progress Note  Patient Name: [name] Date of Encounter: 11/18/2019  Reviewed interval hospital course since assessment yesterday and discussed the case with Dr. Denton Brick.  He anticipates discharge back to [facility] today with plan for PT.  [facility] will sign off.   Medication Recommendations: Aspirin 81 mg daily, Coreg 6.25 mg twice daily, Imdur 15 mg daily, losartan 50 mg daily, Zocor 40 mg daily, Aldactone 12.5 mg daily Other recommendations (labs, testing, etc):  No cardiac testing at this time Follow up as an outpatient:  We will schedule a virtual encounter with the patient over the next 6 weeks  Signed, Rozann Lesches, MD  11/18/2019, 9:08 AM

## 2019-11-18 NOTE — Discharge Instructions (Signed)
1)Very low-salt diet advised 2)Weigh yourself daily, call if you gain more than 3 pounds in 1 day or more than 5 pounds in 1 week as your diuretic medications may need to be adjusted 3)Limit your Fluid  intake to no more than 60 ounces (1.8 Liters) per day 4)Avoid ibuprofen/Advil/Aleve/Motrin/Goody Powders/Naproxen/BC powders/Meloxicam/Diclofenac/Indomethacin and other Nonsteroidal anti-inflammatory medications as these will make you more likely to bleed and can cause stomach ulcers, can also cause Kidney problems.  

## 2019-11-18 NOTE — NC FL2 (Signed)
[place] LEVEL OF CARE SCREENING TOOL     IDENTIFICATION  Patient Name: [name] Birthdate: [birth year] Sex: female Admission Date (Current Location): 11/15/2019  [facility] and [place] Number:  [employer] and Address:  [place] [address] [place]      Provider Number: [id number]  Attending Physician Name and Address:  Roxan Hockey, MD  Relative Name and Phone Number:  Alayia Rena (son) [facility]: [phone]    Current Level of Care: Hospital Recommended Level of Care: [place] Prior Approval Number:    Date Approved/Denied:   PASRR Number: [id number]  Discharge Plan: Other (Comment)([facility])    Current Diagnoses: Patient Active Problem List   Diagnosis Date Noted  . Cardiomyopathy ([place]) 11/17/2019  . Ascending aortic aneurysm ([place]) 11/17/2019  . NSTEMI (non-ST elevated myocardial infarction) ([place]) 11/16/2019  . Chest pain 11/15/2019  . Postoperative anemia due to acute blood loss 09/08/2016  . Closed intertrochanteric fracture of left hip, initial encounter ([place]) 09/05/2016  . Dementia ([place]) 09/05/2016  . Abdominal pain 01/10/2011  . DERANGEMENT OF POSTERIOR HORN OF MEDIAL MENISCUS 09/25/2010  . CLOSED DISLOCATION OF DISTAL RADIOULNAR 03/28/2010  . COLONIC POLYPS, ADENOMATOUS, HX OF 07/31/2009  . PAROXYSMAL ATRIAL FIBRILLATION 10/26/2008  . Deficiency anemia 10/25/2008  . OTHER THALASSEMIA 10/25/2008  . DIVERTICULITIS, COLON 10/25/2008  . Diabetes mellitus due to underlying condition, controlled ([place]) 10/24/2008  . HYPERCHOLESTEROLEMIA 10/24/2008  . Iron deficiency anemia 10/24/2008  . Essential hypertension 10/24/2008  . Osteoporosis 10/24/2008  . HYPERTHYROIDISM, HX OF 10/24/2008    Orientation RESPIRATION BLADDER Height & Weight     Self, Situation  Normal Incontinent Weight: 68 kg Height:  5\' 2"  (157.5 cm)  BEHAVIORAL SYMPTOMS/MOOD NEUROLOGICAL BOWEL NUTRITION  STATUS      Continent Diet(Heart Healthy)  AMBULATORY STATUS COMMUNICATION OF NEEDS Skin   Limited Assist Verbally Other (Comment)(Skin tear on right distal leg)                       Personal Care Assistance Level of Assistance  Bathing, Feeding, Dressing Bathing Assistance: Limited assistance Feeding assistance: Independent Dressing Assistance: Limited assistance     Functional Limitations Info  Sight, Hearing, Speech Sight Info: Impaired(Wears glasses) Hearing Info: Adequate Speech Info: Adequate    SPECIAL CARE FACTORS FREQUENCY  PT (By licensed PT)     PT Frequency: Min 3x's weekly              Contractures Contractures Info: Not present    Additional Factors Info  Allergies, Psychotropic, Code Status Code Status Info: DNR Allergies Info: Procaine Hcl; Sulfonamide Derivatives; Penicillins; Bee Venom; Carbapenems; Cephalosporins Psychotropic Info: Pamelor (nortriptyline)         Current Medications (11/18/2019):  This is the current hospital active medication list Current Facility-Administered Medications  Medication Dose Route Frequency Provider Last Rate Last Admin  . 0.9 %  sodium chloride infusion  250 mL Intravenous PRN Oswald Hillock, MD      . acetaminophen (TYLENOL) tablet 650 mg  650 mg Oral Q6H PRN Roxan Hockey, MD   650 mg at 11/17/19 0909  . aspirin EC tablet 81 mg  81 mg Oral Daily Satira Sark, MD   81 mg at 11/18/19 [id number]  . carvedilol (COREG) tablet 6.25 mg  6.25 mg Oral BID WC Satira Sark, MD   6.25 mg at 11/18/19 0818  . Chlorhexidine Gluconate Cloth 2 % PADS 6 each  6 each Topical Daily Kathie Dike, MD   6 each at 11/16/19 [phone]  . cycloSPORINE (RESTASIS) 0.05 % ophthalmic emulsion 1 drop  1 drop Both Eyes BID Oswald Hillock, MD   1 drop at 11/18/19 0819  . docusate sodium (COLACE) capsule 100 mg  100 mg Oral BID Oswald Hillock, MD   100 mg at 11/18/19 0819  . donepezil (ARICEPT) tablet 5 mg  5 mg Oral QHS Oswald Hillock, MD    5 mg at 11/17/19 2129  . enoxaparin (LOVENOX) injection 40 mg  40 mg Subcutaneous QHS Kathie Dike, MD   40 mg at 11/18/19 0820  . iron polysaccharides (NIFEREX) capsule 150 mg  150 mg Oral BID Oswald Hillock, MD   150 mg at 11/18/19 0818  . isosorbide mononitrate (IMDUR) 24 hr tablet 15 mg  15 mg Oral QPM Satira Sark, MD   15 mg at 11/17/19 1925  . lidocaine (LIDODERM) 5 % 1 patch  1 patch Transdermal Q24H Oswald Hillock, MD   1 patch at 11/18/19 [phone]  . losartan (COZAAR) tablet 50 mg  50 mg Oral Daily Satira Sark, MD   50 mg at 11/18/19 [id number]  . meclizine (ANTIVERT) tablet 12.5 mg  12.5 mg Oral Q6H PRN Oswald Hillock, MD      . memantine [facility]) tablet 10 mg  10 mg Oral BID Oswald Hillock, MD   10 mg at 11/18/19 0818  . morphine 2 MG/ML injection 1 mg  1 mg Intravenous Q4H PRN Oswald Hillock, MD   1 mg at 11/16/19 0646  . nortriptyline (PAMELOR) capsule 25 mg  25 mg Oral QHS Oswald Hillock, MD   25 mg at 11/17/19 2128  . ondansetron (ZOFRAN) tablet 4 mg  4 mg Oral Q6H PRN Oswald Hillock, MD       Or  . ondansetron [facility]) injection 4 mg  4 mg Intravenous Q6H PRN Oswald Hillock, MD      . simvastatin (ZOCOR) tablet 40 mg  40 mg Oral q1800 Imogene Burn, PA-C   40 mg at 11/17/19 1924  . sodium chloride flush (NS) 0.9 % injection 3 mL  3 mL Intravenous Q12H Oswald Hillock, MD   3 mL at 11/18/19 0821  . sodium chloride flush (NS) 0.9 % injection 3 mL  3 mL Intravenous PRN Oswald Hillock, MD      . spironolactone (ALDACTONE) tablet 12.5 mg  12.5 mg Oral Daily Satira Sark, MD   12.5 mg at 11/18/19 0818  . tamsulosin (FLOMAX) capsule 0.4 mg  0.4 mg Oral QPC supper Roxan Hockey, MD   0.4 mg at 11/17/19 2159     Discharge Medications: Medication List    STOP taking these medications   docusate sodium 100 MG capsule Commonly known as: COLACE   metoprolol tartrate 25 MG tablet Commonly known as: LOPRESSOR     TAKE these medications   acetaminophen 325 MG  tablet Commonly known as: TYLENOL Take 2 tablets (650 mg total) by mouth every 6 (six) hours as needed for mild pain, fever or headache.   alendronate 70 MG tablet Commonly known as: FOSAMAX Take 70 mg by mouth once a week. Take with a full glass of water on an empty stomach. Take on Wednesdays   aspirin 81 MG EC tablet Take 1 tablet (81 mg total) by mouth daily with breakfast.   calcium citrate-vitamin D 500-400 MG-UNIT chewable tablet Chew  by mouth 2 (two) times daily.   carvedilol 6.25 MG tablet Commonly known as: COREG Take 1 tablet (6.25 mg total) by mouth 2 (two) times daily with a meal.   Cranberry 450 MG Tabs Take 450 mg by mouth 2 (two) times daily.   cycloSPORINE 0.05 % ophthalmic emulsion Commonly known as: RESTASIS Place 1 drop into both eyes 2 (two) times daily.   donepezil 5 MG tablet Commonly known as: ARICEPT Take 1 tablet (5 mg total) by mouth at bedtime.   fish oil-omega-3 fatty acids 1000 MG capsule Take 2 g by mouth daily.   iron polysaccharides 150 MG capsule Commonly known as: NIFEREX Take 1 capsule (150 mg total) by mouth 2 (two) times daily.   isosorbide mononitrate 30 MG 24 hr tablet Commonly known as: IMDUR Take 0.5 tablets (15 mg total) by mouth every evening.   lidocaine 5 % Commonly known as: Lidoderm Place 1 patch onto the skin daily. Remove & Discard patch within 12 hours or as directed by MD What changed: additional instructions   losartan 50 MG tablet Commonly known as: COZAAR Take 1 tablet (50 mg total) by mouth daily. Start taking on: Nov 19, 2019   meclizine 12.5 MG tablet Commonly known as: ANTIVERT Take 12.5 mg by mouth every 6 (six) hours as needed for dizziness.   memantine 10 MG tablet Commonly known as: NAMENDA Take 1 tablet (10 mg total) by mouth 2 (two) times daily.   nortriptyline 25 MG capsule Commonly known as: PAMELOR Take 25 mg by mouth at bedtime.   senna-docusate 8.6-50 MG tablet Commonly  known as: Senokot-S Take 2 tablets by mouth at bedtime.   simvastatin 40 MG tablet Commonly known as: ZOCOR Take 1 tablet (40 mg total) by mouth every evening.   spironolactone 25 MG tablet Commonly known as: ALDACTONE Take 0.5 tablets (12.5 mg total) by mouth daily. Start taking on: Nov 19, 2019   tamsulosin 0.4 MG Caps capsule Commonly known as: FLOMAX Take 1 capsule (0.4 mg total) by mouth daily after supper.        Relevant Imaging Results:  Relevant Lab Results:   Additional Information SSN#: [id number]  Boneta Lucks, RN

## 2019-12-30 ENCOUNTER — Telehealth (INDEPENDENT_AMBULATORY_CARE_PROVIDER_SITE_OTHER): Payer: Medicare HMO | Admitting: Student

## 2019-12-30 ENCOUNTER — Encounter: Payer: Self-pay | Admitting: Student

## 2019-12-30 ENCOUNTER — Other Ambulatory Visit: Payer: Self-pay

## 2019-12-30 VITALS — BP 120/68 | HR 70 | Resp 18 | Ht 62.0 in | Wt 142.1 lb

## 2019-12-30 DIAGNOSIS — I7121 Aneurysm of the ascending aorta, without rupture: Secondary | ICD-10-CM

## 2019-12-30 DIAGNOSIS — E785 Hyperlipidemia, unspecified: Secondary | ICD-10-CM

## 2019-12-30 DIAGNOSIS — I10 Essential (primary) hypertension: Secondary | ICD-10-CM | POA: Diagnosis not present

## 2019-12-30 DIAGNOSIS — I5042 Chronic combined systolic (congestive) and diastolic (congestive) heart failure: Secondary | ICD-10-CM | POA: Diagnosis not present

## 2019-12-30 DIAGNOSIS — I712 Thoracic aortic aneurysm, without rupture: Secondary | ICD-10-CM

## 2019-12-30 DIAGNOSIS — I214 Non-ST elevation (NSTEMI) myocardial infarction: Secondary | ICD-10-CM

## 2019-12-30 NOTE — Patient Instructions (Addendum)
Medication Instructions:  Your physician recommends that you continue on your current medications as directed. Please refer to the Current Medication list given to you today.   *If you need a refill on your cardiac medications before your next appointment, please call your pharmacy*   Lab Work: Your physician recommends that you return for lab work in: 2-3 Weeks ( CMET, Lipid)   If you have labs (blood work) drawn today and your tests are completely normal, you will receive your results only by: Marland Kitchen MyChart Message (if you have MyChart) OR . A paper copy in the mail If you have any lab test that is abnormal or we need to change your treatment, we will call you to review the results.   Testing/Procedures: NONE    Follow-Up: At [facility], you and your health needs are our priority.  As part of our continuing mission to provide you with exceptional heart care, we have created designated Provider Care Teams.  These Care Teams include your primary Cardiologist (physician) and Advanced Practice Providers (APPs -  Physician Assistants and Nurse Practitioners) who all work together to provide you with the care you need, when you need it.  We recommend signing up for the patient portal called "MyChart".  Sign up information is provided on this After Visit Summary.  MyChart is used to connect with patients for Virtual Visits (Telemedicine).  Patients are able to view lab/test results, encounter notes, upcoming appointments, etc.  Non-urgent messages can be sent to your provider as well.   To learn more about what you can do with MyChart, go to [email or web address].    Your next appointment:   3-4 month(s)  The format for your next appointment:   Either In Person or Virtual  Provider:   Rozann Lesches, MD or Bernerd Pho, PA-C   Other Instructions Thank you for choosing [place]!

## 2019-12-30 NOTE — Progress Notes (Signed)
Virtual Visit via Telephone Note   This visit type was conducted due to national recommendations for restrictions regarding the COVID-19 Pandemic (e.g. social distancing) in an effort to limit this patient's exposure and mitigate transmission in our community.  Due to her co-morbid illnesses, this patient is at least at moderate risk for complications without adequate follow up.  This format is felt to be most appropriate for this patient at this time.  The patient did not have access to video technology/had technical difficulties with video requiring transitioning to audio format only (telephone).  All issues noted in this document were discussed and addressed.  No physical exam could be performed with this format.  Please refer to the patient's chart for her  consent to telehealth for [facility].   The patient was identified using 2 identifiers.  Date:  12/30/2019   ID:  [name], DOB [birth year], MRN [id number]  Patient Location: [place] Provider Location: Home  PCP:  Tilda Burrow, NP  Cardiologist:  Rozann Lesches, MD  Electrophysiologist:  None   Evaluation Performed:  Follow-Up Visit  Chief Complaint: Hospital Follow-Up  History of Present Illness:    [name] is a 86 y.o. female with past medical history of paroxysmal atrial fibrillation (not on anticoagulation given dementia, fall risk and no recent recurrence), HTN, HLD and dementia who presents for a hospital follow-up telehealth visit.  She was admitted to [facility] in 10/2019 for evaluation of chest pain which radiated into her back. CT Abdomen showed no acute abdominopelvic abnormalities but she was noted to have trace bilateral pleural effusions. CT Chest showed mild cardiomegaly and coronary vascular calcifications with a borderline enlarged ascending aorta measuring 4 cm with annual imaging recommended. She was found to have an NSTEMI and troponin values peaked at 1433.   Echocardiogram showed her EF was reduced at 35 to 40% with global hypokinesis and grade 1 diastolic dysfunction. Conservative management with medical therapy was recommended and she was discharged on ASA 81 mg daily, Coreg 6.25 mg twice daily, Imdur 15 mg daily, Losartan 50 mg daily, Zocor 40 mg daily and Aldactone 12.5 mg daily.  In talking the patient today, she reports overall doing well since her hospitalization and denies any recent chest pain or dyspnea on exertion. No recent orthopnea, PND or lower extremity edema.  Sharyn Lull who works at [employer] and has helped care for the patient for the past 8 years reports she has been doing great since returning to SNF and has been participating in activities and working with physical therapy. She walks to the dining room with her walker for all meals. No recent dizziness or presyncope.  The patient does not have symptoms concerning for COVID-19 infection (fever, chills, cough, or new shortness of breath).    Past Medical History:  Diagnosis Date  . Atrial fibrillation ([place])   . Cardiomyopathy ([place])    a. 10/2019: EF 35-40% by echo --> medical management recommended.   . Dementia ([place])   . Diverticulitis 8/09, 3/10   Abscess in 2009 (medical tx)  . Essential hypertension   . Hyperlipidemia   . Hyperthyroidism    Radiation, surgery  . IDA (iron deficiency anemia)   . Osteoporosis   . Postoperative anemia due to acute blood loss 09/08/2016  . Thalassemia   . Tubular adenoma of colon 12/15/08   Colonosocpy Dr Tonita Cong removed cecum  . Type 2 diabetes mellitus ([place])    Past Surgical History:  Procedure Laterality Date  . CESAREAN SECTION     x2  . CHOLECYSTECTOMY    . FOOT SURGERY     bilat  . INTRAMEDULLARY (IM) NAIL INTERTROCHANTERIC Left 09/06/2016   Procedure: LEFT HIP INTRAMEDULLARY (IM) NAIL;  Surgeon: Renette Butters, MD;  Location: [place];  Service: Orthopedics;  Laterality: Left;  . THYROIDECTOMY, PARTIAL       Current Meds    Medication Sig  . acetaminophen (TYLENOL) 325 MG tablet Take 2 tablets (650 mg total) by mouth every 6 (six) hours as needed for mild pain, fever or headache.  . alendronate (FOSAMAX) 70 MG tablet Take 70 mg by mouth once a week. Take with a full glass of water on an empty stomach. Take on Wednesdays  . aspirin 81 MG EC tablet Take 1 tablet (81 mg total) by mouth daily with breakfast.  . calcium citrate-vitamin D 500-400 MG-UNIT chewable tablet Chew by mouth 2 (two) times daily.  . carvedilol (COREG) 6.25 MG tablet Take 1 tablet (6.25 mg total) by mouth 2 (two) times daily with a meal.  . Cranberry 450 MG TABS Take 450 mg by mouth 2 (two) times daily.  . cycloSPORINE (RESTASIS) 0.05 % ophthalmic emulsion Place 1 drop into both eyes 2 (two) times daily.  Marland Kitchen donepezil (ARICEPT) 5 MG tablet Take 1 tablet (5 mg total) by mouth at bedtime.  . ferrous sulfate 325 (65 FE) MG tablet Take 325 mg by mouth in the morning and at bedtime.  . fish oil-omega-3 fatty acids 1000 MG capsule Take 2 g by mouth daily.    . iron polysaccharides (NIFEREX) 150 MG capsule Take 1 capsule (150 mg total) by mouth 2 (two) times daily.  . isosorbide mononitrate (IMDUR) 30 MG 24 hr tablet Take 0.5 tablets (15 mg total) by mouth every evening.  . lidocaine (LIDODERM) 5 % Place 1 patch onto the skin daily. Remove & Discard patch within 12 hours or as directed by MD  . losartan (COZAAR) 50 MG tablet Take 1 tablet (50 mg total) by mouth daily.  . meclizine (ANTIVERT) 12.5 MG tablet Take 12.5 mg by mouth every 6 (six) hours as needed for dizziness.  . memantine (NAMENDA) 10 MG tablet Take 1 tablet (10 mg total) by mouth 2 (two) times daily.  . nortriptyline (PAMELOR) 25 MG capsule Take 25 mg by mouth at bedtime.  . senna-docusate (SENOKOT-S) 8.6-50 MG tablet Take 2 tablets by mouth at bedtime.  . simvastatin (ZOCOR) 40 MG tablet Take 1 tablet (40 mg total) by mouth every evening.  Marland Kitchen spironolactone (ALDACTONE) 25 MG tablet Take 0.5  tablets (12.5 mg total) by mouth daily.  . tamsulosin (FLOMAX) 0.4 MG CAPS capsule Take 1 capsule (0.4 mg total) by mouth daily after supper.     Allergies:   Bee venom, Carbapenems, Cephalosporins, Procaine hcl, Sulfonamide derivatives, and Penicillins   Social History   Tobacco Use  . Smoking status: Never Smoker  . Smokeless tobacco: Never Used  Substance Use Topics  . Alcohol use: Never  . Drug use: No     Family Hx: The patient's family history includes Colon cancer (age of onset: 27) in her mother; Coronary artery disease in her mother; Pneumonia in her father; Stroke in her mother; Thalassemia in her sister.  ROS:   Please see the history of present illness.     All other systems reviewed and are negative.   Prior CV studies:   The following studies were reviewed today:  Echocardiogram: 11/2019 IMPRESSIONS    1. Left ventricular ejection fraction, by estimation, is 35 to 40%. The  left ventricle has moderately decreased function. The left ventricle  demonstrates global hypokinesis. The left ventricular internal cavity size  was mildly dilated. There is mild  left ventricular hypertrophy. Left ventricular diastolic parameters are  consistent with Grade I diastolic dysfunction (impaired relaxation).  2. Right ventricular systolic function is normal. The right ventricular  size is normal. Tricuspid regurgitation signal is inadequate for assessing  PA pressure.  3. Left atrial size was moderately dilated.  4. The mitral valve is grossly normal. Mild mitral valve regurgitation.  5. The aortic valve is tricuspid. Aortic valve regurgitation is mild.  6. The inferior vena cava is normal in size with greater than 50%  respiratory variability, suggesting right atrial pressure of 3 mmHg.   Labs/Other Tests and Data Reviewed:    EKG:  An ECG dated 11/17/2019 was personally reviewed today and demonstrated: NSR, HR 74 with no acute ST abnormalities when compared    Recent Labs: 11/16/2019: ALT 26 11/18/2019: BUN 14; Creatinine, Ser 0.63; Hemoglobin 9.6; Platelets 243; Potassium 3.8; Sodium 139   Recent Lipid Panel Lab Results  Component Value Date/Time   CHOL 203 (H) 11/16/2019 12:31 AM   TRIG 56 11/16/2019 12:31 AM   HDL 53 11/16/2019 12:31 AM   CHOLHDL 3.8 11/16/2019 12:31 AM   LDLCALC 139 (H) 11/16/2019 12:31 AM    Wt Readings from Last 3 Encounters:  12/30/19 142 lb 1.6 oz (64.5 kg)  11/15/19 150 lb (68 kg)  11/12/19 140 lb (63.5 kg)     Objective:    Vital Signs:  BP 120/68   Pulse 70   Resp 18   Ht 5\' 2"  (1.575 m)   Wt 142 lb 1.6 oz (64.5 kg)   BMI 25.99 kg/m    General: Pleasant female sounding in NAD Psych: Normal affect. Neuro: Alert and oriented X 3. Lungs:  Resp regular and unlabored while talking on the phone.   ASSESSMENT & PLAN:    1. Subsequent Episode of Care for NSTEMI - HS Troponin values peaked at 1433 during her recent admission and echocardiogram showed a reduced EF of 35-40% as outlined above and medical therapy was recommended.  - She denies any recent anginal symptoms. She has been working with PT, participating in activities at [facility] and walking to the dining room for meals.  - Continue current medical therapy with ASA 81 mg daily, Coreg 6.25 mg twice daily, Imdur 15 mg daily and statin therapy.   2. Chronic Combined Systolic and Diastolic CHF - Recent echocardiogram showed a reduced EF of 35 to 40% and medical management was recommended given her age and dementia. - She denies any recent orthopnea, PND or edema. Weight has been stable when checked at SNF. - Continue current medical therapy with Coreg 6.25 mg twice daily, Imdur 15 mg daily, Losartan 50 mg daily and Spironolactone 12.5 mg daily. Will recheck creatinine and electrolytes given recent initiation of Losartan and Spironolactone. I did not further titrate her medications today as SBP has been in the 110's to 120's when checked at SNF. Will arrange  for follow-up in several months to see if medications can be further titrated at that time pending BP response.   3. HTN - BP was well-controlled at 120/68 when checked earlier today and has been well-controlled per nursing staff.  - Continue current medication regimen with Coreg 6.25mg  BID, Imdur 15mg  daily, Losartan 50mg   daily and Spironolactone 12.5mg  daily.   4. HLD - FLP during recent admission showed total cholesterol 203, triglycerides 56, HDL 53 and LDL 139. She is currently on Simvastatin 40 mg daily.  Will plan to obtain repeat FLP and LFT's in several weeks. Would consider using Crestor if not at goal and due to Simvastatin having multiple drug interactions.   5. Ascending Aortic Aneurysm - Ascending aorta measured 4 cm in diameter by recent imaging with repeat CTA or MRA recommended in 1 year. Pending clinical status at that time, could arrange for repeat imaging next year but she would likely never be a surgical candidate if her aneurysm progressed in the setting of her age and dementia.    COVID-19 Education: The signs and symptoms of COVID-19 were discussed with the patient and how to seek care for testing (follow up with PCP or arrange E-visit). The importance of social distancing was discussed today.  Time:   Today, I have spent 10 minutes with the patient with telehealth technology discussing the above problems.     Medication Adjustments/Labs and Tests Ordered: Current medicines are reviewed at length with the patient today.  Concerns regarding medicines are outlined above.   Tests Ordered: Orders Placed This Encounter  Procedures  . Lipid Profile  . Comprehensive Metabolic Panel (CMET)    Medication Changes: No orders of the defined types were placed in this encounter.   Follow Up:  Either In Person or Virtual in 3-4 months.   Signed, Erma Heritage, PA-C  12/30/2019 4:45 PM    [facility]

## 2020-01-04 LAB — HEPATIC FUNCTION PANEL
AG Ratio: 2 (calc) (ref 1.0–2.5)
ALT: 15 U/L (ref 6–29)
AST: 17 U/L (ref 10–35)
Albumin: 4.2 g/dL (ref 3.6–5.1)
Alkaline phosphatase (APISO): 62 U/L (ref 37–153)
Bilirubin, Direct: 0.2 mg/dL (ref 0.0–0.2)
Globulin: 2.1 g/dL (calc) (ref 1.9–3.7)
Indirect Bilirubin: 0.6 mg/dL (calc) (ref 0.2–1.2)
Total Bilirubin: 0.8 mg/dL (ref 0.2–1.2)
Total Protein: 6.3 g/dL (ref 6.1–8.1)

## 2020-01-04 LAB — LIPID PANEL
Cholesterol: 138 mg/dL (ref <200)
HDL: 51 mg/dL (ref >=50)
LDL Cholesterol (Calc): 68 mg/dL (calc)
Non-HDL Cholesterol (Calc): 87 mg/dL (calc) (ref <130)
Total CHOL/HDL Ratio: 2.7 (calc) (ref <5.0)
Triglycerides: 111 mg/dL (ref <150)

## 2020-01-27 ENCOUNTER — Inpatient Hospital Stay (HOSPITAL_COMMUNITY)
Admission: EM | Admit: 2020-01-27 | Discharge: 2020-01-30 | DRG: 065 | Disposition: A | Discharge status: Home / Self Care | Payer: Medicare HMO | Source: Skilled Nursing Facility | Attending: Internal Medicine | Admitting: Internal Medicine

## 2020-01-27 ENCOUNTER — Other Ambulatory Visit: Payer: Self-pay

## 2020-01-27 ENCOUNTER — Encounter (HOSPITAL_COMMUNITY): Payer: Self-pay | Admitting: Emergency Medicine

## 2020-01-27 ENCOUNTER — Emergency Department (HOSPITAL_COMMUNITY): Payer: Medicare HMO

## 2020-01-27 DIAGNOSIS — I480 Paroxysmal atrial fibrillation: Secondary | ICD-10-CM | POA: Diagnosis present

## 2020-01-27 DIAGNOSIS — Z20822 Contact with and (suspected) exposure to covid-19: Secondary | ICD-10-CM | POA: Diagnosis present

## 2020-01-27 DIAGNOSIS — E7800 Pure hypercholesterolemia, unspecified: Secondary | ICD-10-CM | POA: Diagnosis present

## 2020-01-27 DIAGNOSIS — I5022 Chronic systolic (congestive) heart failure: Secondary | ICD-10-CM | POA: Diagnosis present

## 2020-01-27 DIAGNOSIS — R29702 NIHSS score 2: Secondary | ICD-10-CM | POA: Diagnosis present

## 2020-01-27 DIAGNOSIS — I6381 Other cerebral infarction due to occlusion or stenosis of small artery: Secondary | ICD-10-CM | POA: Diagnosis not present

## 2020-01-27 DIAGNOSIS — I160 Hypertensive urgency: Secondary | ICD-10-CM | POA: Diagnosis present

## 2020-01-27 DIAGNOSIS — R9431 Abnormal electrocardiogram [ECG] [EKG]: Secondary | ICD-10-CM | POA: Diagnosis present

## 2020-01-27 DIAGNOSIS — Z8249 Family history of ischemic heart disease and other diseases of the circulatory system: Secondary | ICD-10-CM

## 2020-01-27 DIAGNOSIS — I214 Non-ST elevation (NSTEMI) myocardial infarction: Secondary | ICD-10-CM | POA: Diagnosis present

## 2020-01-27 DIAGNOSIS — E119 Type 2 diabetes mellitus without complications: Secondary | ICD-10-CM | POA: Diagnosis present

## 2020-01-27 DIAGNOSIS — N390 Urinary tract infection, site not specified: Secondary | ICD-10-CM

## 2020-01-27 DIAGNOSIS — R42 Dizziness and giddiness: Secondary | ICD-10-CM | POA: Diagnosis present

## 2020-01-27 DIAGNOSIS — I252 Old myocardial infarction: Secondary | ICD-10-CM

## 2020-01-27 DIAGNOSIS — I429 Cardiomyopathy, unspecified: Secondary | ICD-10-CM | POA: Diagnosis present

## 2020-01-27 DIAGNOSIS — R410 Disorientation, unspecified: Secondary | ICD-10-CM | POA: Diagnosis present

## 2020-01-27 DIAGNOSIS — Z66 Do not resuscitate: Secondary | ICD-10-CM | POA: Diagnosis present

## 2020-01-27 DIAGNOSIS — I110 Hypertensive heart disease with heart failure: Secondary | ICD-10-CM | POA: Diagnosis present

## 2020-01-27 DIAGNOSIS — F0390 Unspecified dementia without behavioral disturbance: Secondary | ICD-10-CM | POA: Diagnosis present

## 2020-01-27 DIAGNOSIS — I10 Essential (primary) hypertension: Secondary | ICD-10-CM | POA: Diagnosis present

## 2020-01-27 DIAGNOSIS — E785 Hyperlipidemia, unspecified: Secondary | ICD-10-CM | POA: Diagnosis present

## 2020-01-27 DIAGNOSIS — Z823 Family history of stroke: Secondary | ICD-10-CM

## 2020-01-27 DIAGNOSIS — Z800 Family history of malignant neoplasm of digestive organs: Secondary | ICD-10-CM | POA: Diagnosis not present

## 2020-01-27 DIAGNOSIS — I639 Cerebral infarction, unspecified: Secondary | ICD-10-CM | POA: Diagnosis not present

## 2020-01-27 DIAGNOSIS — D509 Iron deficiency anemia, unspecified: Secondary | ICD-10-CM

## 2020-01-27 DIAGNOSIS — M810 Age-related osteoporosis without current pathological fracture: Secondary | ICD-10-CM | POA: Diagnosis present

## 2020-01-27 DIAGNOSIS — I4891 Unspecified atrial fibrillation: Secondary | ICD-10-CM | POA: Diagnosis not present

## 2020-01-27 LAB — COMPREHENSIVE METABOLIC PANEL
ALT: 23 U/L (ref 0–44)
AST: 22 U/L (ref 15–41)
Albumin: 3.9 g/dL (ref 3.5–5.0)
Alkaline Phosphatase: 56 U/L (ref 38–126)
Anion gap: 10 (ref 5–15)
BUN: 10 mg/dL (ref 8–23)
CO2: 25 mmol/L (ref 22–32)
Calcium: 9 mg/dL (ref 8.9–10.3)
Chloride: 103 mmol/L (ref 98–111)
Creatinine, Ser: 0.63 mg/dL (ref 0.44–1.00)
GFR calc Af Amer: >60 mL/min (ref >60)
GFR calc non Af Amer: >60 mL/min (ref >60)
Glucose, Bld: 127 mg/dL — ABNORMAL HIGH (ref 70–99)
Potassium: 3.7 mmol/L (ref 3.5–5.1)
Sodium: 138 mmol/L (ref 135–145)
Total Bilirubin: 0.9 mg/dL (ref 0.3–1.2)
Total Protein: 6.6 g/dL (ref 6.5–8.1)

## 2020-01-27 LAB — CBC WITH DIFFERENTIAL/PLATELET
Abs Immature Granulocytes: 0.03 10*3/uL (ref 0.00–0.07)
Basophils Absolute: 0 10*3/uL (ref 0.0–0.1)
Basophils Relative: 0 %
Eosinophils Absolute: 0.3 10*3/uL (ref 0.0–0.5)
Eosinophils Relative: 3 %
HCT: 35.3 % — ABNORMAL LOW (ref 36.0–46.0)
Hemoglobin: 10.7 g/dL — ABNORMAL LOW (ref 12.0–15.0)
Immature Granulocytes: 0 %
Lymphocytes Relative: 38 %
Lymphs Abs: 3.1 10*3/uL (ref 0.7–4.0)
MCH: 19.8 pg — ABNORMAL LOW (ref 26.0–34.0)
MCHC: 30.3 g/dL (ref 30.0–36.0)
MCV: 65.2 fL — ABNORMAL LOW (ref 80.0–100.0)
Monocytes Absolute: 1.1 10*3/uL — ABNORMAL HIGH (ref 0.1–1.0)
Monocytes Relative: 13 %
Neutro Abs: 3.8 10*3/uL (ref 1.7–7.7)
Neutrophils Relative %: 46 %
Platelets: 222 10*3/uL (ref 150–400)
RBC: 5.41 MIL/uL — ABNORMAL HIGH (ref 3.87–5.11)
RDW: 15.4 % (ref 11.5–15.5)
WBC: 8.3 10*3/uL (ref 4.0–10.5)
nRBC: 0 % (ref 0.0–0.2)

## 2020-01-27 LAB — PROTIME-INR
INR: 1 (ref 0.8–1.2)
Prothrombin Time: 12.9 seconds (ref 11.4–15.2)

## 2020-01-27 LAB — SARS CORONAVIRUS 2 BY RT PCR (HOSPITAL ORDER, PERFORMED IN ~~LOC~~ HOSPITAL LAB): SARS Coronavirus 2: NEGATIVE

## 2020-01-27 LAB — URINALYSIS, ROUTINE W REFLEX MICROSCOPIC
Bilirubin Urine: NEGATIVE
Glucose, UA: NEGATIVE mg/dL
Ketones, ur: NEGATIVE mg/dL
Nitrite: POSITIVE — AB
Protein, ur: NEGATIVE mg/dL
Specific Gravity, Urine: 1.003 — ABNORMAL LOW (ref 1.005–1.030)
WBC, UA: >50 WBC/hpf — ABNORMAL HIGH (ref 0–5)
pH: 8 (ref 5.0–8.0)

## 2020-01-27 LAB — RAPID URINE DRUG SCREEN, HOSP PERFORMED
Amphetamines: NOT DETECTED
Barbiturates: NOT DETECTED
Benzodiazepines: NOT DETECTED
Cocaine: NOT DETECTED
Opiates: NOT DETECTED
Tetrahydrocannabinol: NOT DETECTED

## 2020-01-27 LAB — ETHANOL: Alcohol, Ethyl (B): <10 mg/dL (ref <10)

## 2020-01-27 LAB — APTT: aPTT: 27 seconds (ref 24–36)

## 2020-01-27 MED ORDER — ASPIRIN EC 81 MG PO TBEC: 81.0000 mg | DELAYED_RELEASE_TABLET | Freq: Every day | ORAL | Status: DC

## 2020-01-27 MED ORDER — CIPROFLOXACIN IN D5W 400 MG/200ML IV SOLN
400.0000 mg | Freq: Once | INTRAVENOUS | Status: AC
  Administered 2020-01-27: 400 mg via INTRAVENOUS
  Filled 2020-01-27: qty 200

## 2020-01-27 NOTE — ED Provider Notes (Addendum)
[facility] EMERGENCY DEPARTMENT Provider Note   CSN: [id number] Arrival date & time: 01/27/20  2022     History Chief Complaint  Patient presents with  . Dizziness    [name] is a 86 y.o. female.  Patient presenting with dizziness were made worse by moving her head.  Patient initially stated it started a week ago.  The patient's son says she has a little bit of confusion he thinks it started about 2 days ago.  No headache.  No visual changes no speech problems.  Denies any numbness or any motor weakness.  Past medical history is significant for dementia type 2 diabetes hypertension hyperlipidemia cardiomyopathy.  And a history of atrial fibrillation.  Patient is on Coreg patient is on Cozaar.  Patient is not on any blood thinners.  Patient is a DNR.        Past Medical History:  Diagnosis Date  . Atrial fibrillation ([place])   . Cardiomyopathy ([place])    a. 10/2019: EF 35-40% by echo --> medical management recommended.   . Dementia ([place])   . Diverticulitis 8/09, 3/10   Abscess in 2009 (medical tx)  . Essential hypertension   . Hyperlipidemia   . Hyperthyroidism    Radiation, surgery  . IDA (iron deficiency anemia)   . Osteoporosis   . Postoperative anemia due to acute blood loss 09/08/2016  . Thalassemia   . Tubular adenoma of colon 12/15/08   Colonosocpy Dr Tonita Cong removed cecum  . Type 2 diabetes mellitus [facility])     Patient Active Problem List   Diagnosis Date Noted  . Cardiomyopathy ([place]) 11/17/2019  . Ascending aortic aneurysm ([place]) 11/17/2019  . NSTEMI (non-ST elevated myocardial infarction) ([place]) 11/16/2019  . Chest pain 11/15/2019  . Postoperative anemia due to acute blood loss 09/08/2016  . Closed intertrochanteric fracture of left hip, initial encounter ([place]) 09/05/2016  . Dementia ([place]) 09/05/2016  . Abdominal pain 01/10/2011  . DERANGEMENT OF POSTERIOR HORN OF MEDIAL MENISCUS 09/25/2010  . CLOSED DISLOCATION OF DISTAL RADIOULNAR 03/28/2010  .  COLONIC POLYPS, ADENOMATOUS, HX OF 07/31/2009  . PAROXYSMAL ATRIAL FIBRILLATION 10/26/2008  . Deficiency anemia 10/25/2008  . OTHER THALASSEMIA 10/25/2008  . DIVERTICULITIS, COLON 10/25/2008  . Diabetes mellitus due to underlying condition, controlled ([place]) 10/24/2008  . HYPERCHOLESTEROLEMIA 10/24/2008  . Iron deficiency anemia 10/24/2008  . Essential hypertension 10/24/2008  . Osteoporosis 10/24/2008  . HYPERTHYROIDISM, HX OF 10/24/2008    Past Surgical History:  Procedure Laterality Date  . CESAREAN SECTION     x2  . CHOLECYSTECTOMY    . FOOT SURGERY     bilat  . INTRAMEDULLARY (IM) NAIL INTERTROCHANTERIC Left 09/06/2016   Procedure: LEFT HIP INTRAMEDULLARY (IM) NAIL;  Surgeon: Renette Butters, MD;  Location: [place];  Service: Orthopedics;  Laterality: Left;  . THYROIDECTOMY, PARTIAL       OB History   No obstetric history on file.     Family History  Problem Relation Age of Onset  . Pneumonia Father   . Coronary artery disease Mother   . Stroke Mother   . Colon cancer Mother 80  . Thalassemia Sister     Social History   Tobacco Use  . Smoking status: Never Smoker  . Smokeless tobacco: Never Used  Substance Use Topics  . Alcohol use: Never  . Drug use: No    Home Medications Prior to Admission medications   Medication Sig Start Date End Date Taking? Authorizing Provider  acetaminophen (TYLENOL) 325 MG  tablet Take 2 tablets (650 mg total) by mouth every 6 (six) hours as needed for mild pain, fever or headache. 11/18/19   Roxan Hockey, MD  alendronate (FOSAMAX) 70 MG tablet Take 70 mg by mouth once a week. Take with a full glass of water on an empty stomach. Take on Wednesdays    [provider]  aspirin 81 MG EC tablet Take 1 tablet (81 mg total) by mouth daily with breakfast. 11/18/19   Roxan Hockey, MD  calcium citrate-vitamin D 500-400 MG-UNIT chewable tablet Chew by mouth 2 (two) times daily.    [provider]  carvedilol (COREG) 6.25  MG tablet Take 1 tablet (6.25 mg total) by mouth 2 (two) times daily with a meal. 11/18/19   Emokpae, Courage, MD  Cranberry 450 MG TABS Take 450 mg by mouth 2 (two) times daily.    [provider]  cycloSPORINE (RESTASIS) 0.05 % ophthalmic emulsion Place 1 drop into both eyes 2 (two) times daily.    [provider]  donepezil (ARICEPT) 5 MG tablet Take 1 tablet (5 mg total) by mouth at bedtime. 11/18/19   Roxan Hockey, MD  ferrous sulfate 325 (65 FE) MG tablet Take 325 mg by mouth in the morning and at bedtime.    [provider]  fish oil-omega-3 fatty acids 1000 MG capsule Take 2 g by mouth daily.      [provider]  iron polysaccharides (NIFEREX) 150 MG capsule Take 1 capsule (150 mg total) by mouth 2 (two) times daily. 11/18/19   Roxan Hockey, MD  isosorbide mononitrate (IMDUR) 30 MG 24 hr tablet Take 0.5 tablets (15 mg total) by mouth every evening. 11/18/19   Roxan Hockey, MD  lidocaine (LIDODERM) 5 % Place 1 patch onto the skin daily. Remove & Discard patch within 12 hours or as directed by MD 11/12/19   Nuala Alpha A, PA-C  losartan (COZAAR) 50 MG tablet Take 1 tablet (50 mg total) by mouth daily. 11/19/19   Roxan Hockey, MD  meclizine (ANTIVERT) 12.5 MG tablet Take 12.5 mg by mouth every 6 (six) hours as needed for dizziness.    [provider]  memantine (NAMENDA) 10 MG tablet Take 1 tablet (10 mg total) by mouth 2 (two) times daily. 11/18/19   Roxan Hockey, MD  nortriptyline (PAMELOR) 25 MG capsule Take 25 mg by mouth at bedtime.    [provider]  senna-docusate (SENOKOT-S) 8.6-50 MG tablet Take 2 tablets by mouth at bedtime. 11/18/19 11/17/20  Roxan Hockey, MD  simvastatin (ZOCOR) 40 MG tablet Take 1 tablet (40 mg total) by mouth every evening. 11/18/19   Roxan Hockey, MD  spironolactone (ALDACTONE) 25 MG tablet Take 0.5 tablets (12.5 mg total) by mouth daily. 11/19/19   Roxan Hockey, MD  tamsulosin (FLOMAX) 0.4 MG  CAPS capsule Take 1 capsule (0.4 mg total) by mouth daily after supper. 11/18/19   Roxan Hockey, MD    Allergies    Bee venom, Carbapenems, Cephalosporins, Procaine hcl, Sulfonamide derivatives, and Penicillins  Review of Systems   Review of Systems  Constitutional: Negative for chills and fever.  HENT: Negative for congestion, rhinorrhea and sore throat.   Eyes: Negative for visual disturbance.  Respiratory: Negative for cough and shortness of breath.   Cardiovascular: Negative for chest pain and leg swelling.  Gastrointestinal: Negative for abdominal pain, diarrhea, nausea and vomiting.  Genitourinary: Negative for dysuria.  Musculoskeletal: Negative for back pain and neck pain.  Skin: Negative for rash.  Neurological: Positive for dizziness. Negative for light-headedness and headaches.  Hematological: Does not bruise/bleed easily.  Psychiatric/Behavioral: Negative for confusion.    Physical Exam Updated Vital Signs BP (!) 200/80   Pulse 64   Temp 98.4 F (36.9 C) (Oral)   Resp 18   Ht 1.575 m (5\' 2" )   Wt 68 kg   SpO2 94%   BMI 27.44 kg/m   Physical Exam Vitals and nursing note reviewed.  Constitutional:      General: She is not in acute distress.    Appearance: Normal appearance. She is well-developed.  HENT:     Head: Normocephalic and atraumatic.  Eyes:     Extraocular Movements: Extraocular movements intact.     Conjunctiva/sclera: Conjunctivae normal.     Pupils: Pupils are equal, round, and reactive to light.  Cardiovascular:     Rate and Rhythm: Normal rate and regular rhythm.     Heart sounds: No murmur heard.   Pulmonary:     Effort: Pulmonary effort is normal. No respiratory distress.     Breath sounds: Normal breath sounds.  Abdominal:     Palpations: Abdomen is soft.     Tenderness: There is no abdominal tenderness.  Musculoskeletal:        General: Normal range of motion.     Cervical back: Normal range of motion and neck supple.  Skin:     General: Skin is warm and dry.     Capillary Refill: Capillary refill takes less than 2 seconds.  Neurological:     General: No focal deficit present.     Mental Status: She is alert and oriented to person, place, and time.     Cranial Nerves: No cranial nerve deficit.     Sensory: No sensory deficit.     Motor: No weakness.     Coordination: Coordination normal.     ED Results / Procedures / Treatments   Labs (all labs ordered are listed, but only abnormal results are displayed) Labs Reviewed  CBC WITH DIFFERENTIAL/PLATELET - Abnormal; Notable for the following components:      Result Value   RBC 5.41 (*)    Hemoglobin 10.7 (*)    HCT 35.3 (*)    MCV 65.2 (*)    MCH 19.8 (*)    Monocytes Absolute 1.1 (*)    All other components within normal limits  COMPREHENSIVE METABOLIC PANEL - Abnormal; Notable for the following components:   Glucose, Bld 127 (*)    All other components within normal limits  URINALYSIS, ROUTINE W REFLEX MICROSCOPIC - Abnormal; Notable for the following components:   APPearance HAZY (*)    Specific Gravity, Urine 1.003 (*)    Hgb urine dipstick SMALL (*)    Nitrite POSITIVE (*)    Leukocytes,Ua LARGE (*)    WBC, UA >50 (*)    Bacteria, UA RARE (*)    All other components within normal limits  SARS CORONAVIRUS 2 BY RT PCR (HOSPITAL ORDER, [place] [facility])  URINE CULTURE  ETHANOL  PROTIME-INR  APTT  DIFFERENTIAL  RAPID URINE DRUG SCREEN, HOSP PERFORMED  I-STAT CHEM 8, ED    EKG EKG Interpretation  Date/Time:  Thursday January 27 2020 20:34:53 EDT Ventricular Rate:  66 PR Interval:    QRS Duration: 110 QT Interval:  493 QTC Calculation: 517 R Axis:   15 Text Interpretation: Sinus rhythm Prolonged QT interval No significant change since last tracing Confirmed by Fredia Sorrow [phone]) on 01/27/2020  10:17:20 PM   Radiology MR ANGIO HEAD WO CONTRAST  Result Date: 01/27/2020 CLINICAL DATA:  Initial evaluation for acute  dizziness, hypertension. EXAM: MRI HEAD WITHOUT CONTRAST MRA HEAD WITHOUT CONTRAST TECHNIQUE: Multiplanar, multiecho pulse sequences of the brain and surrounding structures were obtained without intravenous contrast. Angiographic images of the head were obtained using MRA technique without contrast. COMPARISON:  Comparison made with prior head CT from 09/05/2016. FINDINGS: MRI HEAD FINDINGS Brain: Temporal lobe predominant cerebral atrophy noted. Patchy T2/FLAIR hyperintensity within the periventricular and deep white matter both cerebral hemispheres most consistent with chronic small vessel ischemic disease, mild to moderate in nature. Mild patchy involvement of the pons noted. Superimposed small remote lacunar infarct noted at the right basal ganglia. 6 mm focus of diffusion abnormality seen involving the left periatrial white matter (series 3, image 29), consistent with an acute to early subacute small vessel type infarct. No associated hemorrhage or mass effect. No other diffusion abnormality to suggest acute or subacute ischemia. Gray-white matter differentiation otherwise maintained. No encephalomalacia to suggest chronic cortical infarction. No evidence for acute intracranial hemorrhage. Few scattered punctate foci of susceptibility artifact noted involving the supratentorial brain, consistent with small chronic micro hemorrhages, favored to be hypertensive in nature. No mass lesion, midline shift or mass effect. No hydrocephalus or extra-axial fluid collection. Pituitary gland suprasellar region normal. Midline structures intact. Vascular: Major intracranial vascular flow voids are maintained. Skull and upper cervical spine: Craniocervical junction within normal limits. Upper cervical spine normal. Bone marrow signal intensity within normal limits. No scalp soft tissue abnormality. Sinuses/Orbits: Patient status post bilateral ocular lens replacement. Paranasal sinuses are largely clear. Trace right mastoid  effusion noted, of doubtful significance. Inner ear structures grossly normal. Other: None. MRA HEAD FINDINGS ANTERIOR CIRCULATION: Visualized distal cervical segments of the internal carotid arteries are patent with symmetric antegrade flow. Petrous, cavernous, and supraclinoid ICAs widely patent without stenosis or other abnormality. ICA termini well perfused. A1 segments patent bilaterally. Normal anterior communicating artery complex. Anterior cerebral arteries widely patent to their distal aspects without stenosis. No M1 stenosis or occlusion. Normal MCA bifurcations. Distal MCA branches well perfused and symmetric. POSTERIOR CIRCULATION: Vertebral arteries patent to the vertebrobasilar junction without stenosis. Left vertebral artery dominant. Both picas patent proximally. Basilar widely patent to its distal aspect without stenosis. Superior cerebral arteries patent bilaterally. Both PCAs primarily supplied via the basilar and are well perfused or distal aspects without stenosis. Small bilateral posterior communicating arteries noted. No intracranial aneurysm or other vascular abnormality. IMPRESSION: MRI HEAD IMPRESSION: 1. 6 mm focus of diffusion abnormality involving the left periatrial white matter, consistent with an acute to early subacute small vessel type infarct. No associated hemorrhage or mass effect. 2. Temporal lobe predominant cerebral atrophy with mild to moderate chronic microvascular ischemic disease. MRA HEAD IMPRESSION: Negative intracranial MRA. No large vessel occlusion. No hemodynamically significant or correctable stenosis. Electronically Signed   By: Jeannine Boga M.D.   On: 01/27/2020 21:54   MR Brain Wo Contrast (neuro protocol)  Result Date: 01/27/2020 CLINICAL DATA:  Initial evaluation for acute dizziness, hypertension. EXAM: MRI HEAD WITHOUT CONTRAST MRA HEAD WITHOUT CONTRAST TECHNIQUE: Multiplanar, multiecho pulse sequences of the brain and surrounding structures were  obtained without intravenous contrast. Angiographic images of the head were obtained using MRA technique without contrast. COMPARISON:  Comparison made with prior head CT from 09/05/2016. FINDINGS: MRI HEAD FINDINGS Brain: Temporal lobe predominant cerebral atrophy noted. Patchy T2/FLAIR hyperintensity within the periventricular and deep white matter both cerebral  hemispheres most consistent with chronic small vessel ischemic disease, mild to moderate in nature. Mild patchy involvement of the pons noted. Superimposed small remote lacunar infarct noted at the right basal ganglia. 6 mm focus of diffusion abnormality seen involving the left periatrial white matter (series 3, image 29), consistent with an acute to early subacute small vessel type infarct. No associated hemorrhage or mass effect. No other diffusion abnormality to suggest acute or subacute ischemia. Gray-white matter differentiation otherwise maintained. No encephalomalacia to suggest chronic cortical infarction. No evidence for acute intracranial hemorrhage. Few scattered punctate foci of susceptibility artifact noted involving the supratentorial brain, consistent with small chronic micro hemorrhages, favored to be hypertensive in nature. No mass lesion, midline shift or mass effect. No hydrocephalus or extra-axial fluid collection. Pituitary gland suprasellar region normal. Midline structures intact. Vascular: Major intracranial vascular flow voids are maintained. Skull and upper cervical spine: Craniocervical junction within normal limits. Upper cervical spine normal. Bone marrow signal intensity within normal limits. No scalp soft tissue abnormality. Sinuses/Orbits: Patient status post bilateral ocular lens replacement. Paranasal sinuses are largely clear. Trace right mastoid effusion noted, of doubtful significance. Inner ear structures grossly normal. Other: None. MRA HEAD FINDINGS ANTERIOR CIRCULATION: Visualized distal cervical segments of the  internal carotid arteries are patent with symmetric antegrade flow. Petrous, cavernous, and supraclinoid ICAs widely patent without stenosis or other abnormality. ICA termini well perfused. A1 segments patent bilaterally. Normal anterior communicating artery complex. Anterior cerebral arteries widely patent to their distal aspects without stenosis. No M1 stenosis or occlusion. Normal MCA bifurcations. Distal MCA branches well perfused and symmetric. POSTERIOR CIRCULATION: Vertebral arteries patent to the vertebrobasilar junction without stenosis. Left vertebral artery dominant. Both picas patent proximally. Basilar widely patent to its distal aspect without stenosis. Superior cerebral arteries patent bilaterally. Both PCAs primarily supplied via the basilar and are well perfused or distal aspects without stenosis. Small bilateral posterior communicating arteries noted. No intracranial aneurysm or other vascular abnormality. IMPRESSION: MRI HEAD IMPRESSION: 1. 6 mm focus of diffusion abnormality involving the left periatrial white matter, consistent with an acute to early subacute small vessel type infarct. No associated hemorrhage or mass effect. 2. Temporal lobe predominant cerebral atrophy with mild to moderate chronic microvascular ischemic disease. MRA HEAD IMPRESSION: Negative intracranial MRA. No large vessel occlusion. No hemodynamically significant or correctable stenosis. Electronically Signed   By: Jeannine Boga M.D.   On: 01/27/2020 21:54    Procedures Procedures (including critical care time)  CRITICAL CARE Performed by: Fredia Sorrow Total critical care time: 45 minutes Critical care time was exclusive of separately billable procedures and treating other patients. Critical care was necessary to treat or prevent imminent or life-threatening deterioration. Critical care was time spent personally by me on the following activities: development of treatment plan with patient and/or  surrogate as well as nursing, discussions with consultants, evaluation of patient's response to treatment, examination of patient, obtaining history from patient or surrogate, ordering and performing treatments and interventions, ordering and review of laboratory studies, ordering and review of radiographic studies, pulse oximetry and re-evaluation of patient's condition.   Medications Ordered in ED Medications  ciprofloxacin (CIPRO) IVPB 400 mg (has no administration in time range)    ED Course  I have reviewed the triage vital signs and the nursing notes.  Pertinent labs & imaging results that were available during my care of the patient were reviewed by me and considered in my medical decision making (see chart for details).    MDM Rules/Calculators/A&P  MRI brain shows evidence of a parietal infarct.  Certainly could explain the dizziness.  MRA without any acute vesicle abnormalities.  Code stroke order set done.  Discussed with the neuro hospitalist Dr. Cheral Marker at [facility]  Discussed with the [facility] hospitalist here.  Patient will be admitted to the hospitalist service at [facility].  His Covid testing is negative.  Patient's initial blood pressure was quite high 786 systolic.  But has come down nicely without any intervention or treatment down to 767 systolic.  Based on these numbers will not do any interventional blood pressure at this time.    Final Clinical Impression(s) / ED Diagnoses Final diagnoses:  Dizziness  Cerebrovascular accident (CVA), unspecified mechanism [facility])    Rx / [place] Orders ED Discharge Orders    None       Fredia Sorrow, MD 01/28/20 0015    Fredia Sorrow, MD 01/28/20 [phone]

## 2020-01-27 NOTE — H&P (Signed)
History and Physical  [name] [name] DOB: September 09, 1928 DOA: 01/27/2020  Referring physician: Fredia Sorrow MD PCP: Tilda Burrow, NP  [facility] coming from[facility]  Chief Complaint: Dizziness  HPI: [name] is a 86 y.o. female with medical history significant for atrial fibrillation not on anticoagulation, dementia, diabetes mellitus type 2, hypertension, hyperlipidemia and osteoporosis who presents to the ED due to 2-3 days of dizziness.  History was obtained from son who was at bedside, per son, dizziness worsens with head movement and she also presented with some confusion.  Patient alerted the nursing facility at the assisted living facility, EMS was activated and patient was taken to the ED for further evaluation and management.  Patient denies chest pain, shortness of breath, headache, nausea, vomiting or abdominal pain.    ED Course:  In the emergency department, BP was initially elevated at 200/80, but other vital signs are within normal range.  Work-up in the ED showed microcytic anemia.  Urinalysis was positive for nitrites, leukocytes-large and WBC> 50, however patient denies any burning sensation on urination, urinary frequency or urgency or any other irritative bladder symptoms.  Urine drug screen was negative.  MRI brain showed 6 mm focus of diffusion abnormality involving the left parietal white matter consistent with an acute to wholly subacute small vessel type infarct.  MRA head showed negative intracranial MRA and no large vessel occlusion with no hemodynamically significant or correctable stenosis.  She was treated with IV ciprofloxacin.  Neurologist at [facility] was consulted and accepted patient for transfer per ED physician.  Hospitalist was asked to admit patient for further evaluation and management.  Review of Systems: Constitutional: Negative for chills and fever.  HENT: Negative for ear pain and sore throat.   Eyes:  Negative for pain and visual disturbance.  Respiratory: Negative for cough, chest tightness and shortness of breath.   Cardiovascular: Negative for chest pain and palpitations.  Gastrointestinal: Negative for abdominal pain and vomiting.  Endocrine: Negative for polyphagia and polyuria.  Genitourinary: Negative for decreased urine volume, dysuria, enuresis, hematuria, vaginal discharge and vaginal pain.  Musculoskeletal: Negative for arthralgias and back pain.  Skin: Negative for color change and rash.  Allergic/Immunologic: Negative for immunocompromised state.  Neurological: Positive for dizziness and confusion.  Negative for tremors, syncope, speech difficulty, weakness, light-headedness and headaches.  Hematological: Does not bruise/bleed easily.  All other systems reviewed and are negative   Past Medical History:  Diagnosis Date  . Atrial fibrillation ([place])   . Cardiomyopathy ([place])    a. 10/2019: EF 35-40% by echo --> medical management recommended.   . Dementia ([place])   . Diverticulitis 8/09, 3/10   Abscess in 2009 (medical tx)  . Essential hypertension   . Hyperlipidemia   . Hyperthyroidism    Radiation, surgery  . IDA (iron deficiency anemia)   . Osteoporosis   . Postoperative anemia due to acute blood loss 09/08/2016  . Thalassemia   . Tubular adenoma of colon 12/15/08   Colonosocpy Dr Tonita Cong removed cecum  . Type 2 diabetes mellitus ([place])    Past Surgical History:  Procedure Laterality Date  . CESAREAN SECTION     x2  . CHOLECYSTECTOMY    . FOOT SURGERY     bilat  . INTRAMEDULLARY (IM) NAIL INTERTROCHANTERIC Left 09/06/2016   Procedure: LEFT HIP INTRAMEDULLARY (IM) NAIL;  Surgeon: Renette Butters, MD;  Location: [facility];  Service: Orthopedics;  Laterality: Left;  . THYROIDECTOMY, PARTIAL  Social History:  reports that she has never smoked. She has never used smokeless tobacco. She reports that she does not drink alcohol and does not use drugs.   Allergies    Allergen Reactions  . Bee Venom     Listed per Nursing Center-No reaction is noted  . Carbapenems     Listed per Nursing Center-No reaction is noted  . Cephalosporins     Listed per Nursing Center-No reaction is noted  . Procaine Hcl Other (See Comments)    Numbness/jerkiness  . Sulfonamide Derivatives Other (See Comments)    hypertension  . Penicillins Rash    Family History  Problem Relation Age of Onset  . Pneumonia Father   . Coronary artery disease Mother   . Stroke Mother   . Colon cancer Mother 99  . Thalassemia Sister     Prior to Admission medications   Medication Sig Start Date End Date Taking? Authorizing Provider  acetaminophen (TYLENOL) 325 MG tablet Take 2 tablets (650 mg total) by mouth every 6 (six) hours as needed for mild pain, fever or headache. 11/18/19  Yes Roxan Hockey, MD  alendronate (FOSAMAX) 70 MG tablet Take 70 mg by mouth every Wednesday. Take with a full glass of water on an empty stomach.   Yes [provider]  aspirin 81 MG EC tablet Take 1 tablet (81 mg total) by mouth daily with breakfast. 11/18/19  Yes Emokpae, Courage, MD  calcium citrate-vitamin D 500-400 MG-UNIT chewable tablet Chew by mouth 2 (two) times daily.   Yes [provider]  carvedilol (COREG) 6.25 MG tablet Take 1 tablet (6.25 mg total) by mouth 2 (two) times daily with a meal. 11/18/19  Yes Emokpae, Courage, MD  Cranberry 450 MG TABS Take 450 mg by mouth 2 (two) times daily.   Yes [provider]  cycloSPORINE (RESTASIS) 0.05 % ophthalmic emulsion Place 1 drop into both eyes 2 (two) times daily.   Yes [provider]  donepezil (ARICEPT) 5 MG tablet Take 1 tablet (5 mg total) by mouth at bedtime. 11/18/19  Yes Emokpae, Courage, MD  ferrous sulfate 325 (65 FE) MG tablet Take 325 mg by mouth in the morning and at bedtime.   Yes [provider]  fish oil-omega-3 fatty acids 1000 MG capsule Take 2 g by mouth daily.     Yes [provider]  iron polysaccharides (NIFEREX) 150 MG capsule Take 1 capsule (150 mg total) by mouth 2 (two) times daily. 11/18/19  Yes Roxan Hockey, MD  isosorbide mononitrate (IMDUR) 30 MG 24 hr tablet Take 0.5 tablets (15 mg total) by mouth every evening. 11/18/19  Yes Emokpae, Courage, MD  losartan (COZAAR) 50 MG tablet Take 1 tablet (50 mg total) by mouth daily. 11/19/19  Yes Roxan Hockey, MD  meclizine (ANTIVERT) 12.5 MG tablet Take 12.5 mg by mouth every 6 (six) hours as needed for dizziness.   Yes [provider]  memantine (NAMENDA) 10 MG tablet Take 1 tablet (10 mg total) by mouth 2 (two) times daily. 11/18/19  Yes Emokpae, Courage, MD  nortriptyline (PAMELOR) 25 MG capsule Take 25 mg by mouth at bedtime.   Yes [provider]  senna-docusate (SENOKOT-S) 8.6-50 MG tablet Take 2 tablets by mouth at bedtime. 11/18/19 11/17/20 Yes Emokpae, Courage, MD  simvastatin (ZOCOR) 40 MG tablet Take 1 tablet (40 mg total) by mouth every evening. 11/18/19  Yes Emokpae, Courage, MD  spironolactone (ALDACTONE) 25 MG tablet Take 0.5 tablets (12.5 mg total)  by mouth daily. 11/19/19  Yes Emokpae, Courage, MD  tamsulosin (FLOMAX) 0.4 MG CAPS capsule Take 1 capsule (0.4 mg total) by mouth daily after supper. 11/18/19  Yes Emokpae, Courage, MD  lidocaine (LIDODERM) 5 % Place 1 patch onto the skin daily. Remove & Discard patch within 12 hours or as directed by MD Patient not taking: Reported on 01/27/2020 11/12/19   Deliah Boston, PA-C    Physical Exam: BP (!) 168/76   Pulse 91   Temp 98.4 F (36.9 C) (Oral)   Resp 20   Ht 5\' 2"  (1.575 m)   Wt 68 kg   SpO2 96%   BMI 27.44 kg/m   . General: 86 y.o. year-old female well developed well nourished in no acute distress.  Alert and oriented x3. . Cardiovascular: Regular rate and rhythm with no rubs or gallops.  No thyromegaly or JVD noted.  No lower extremity edema. 2/4 pulses in all 4 extremities. Marland Kitchen Respiratory: Clear to auscultation with no wheezes or  rales. Good inspiratory effort. . Abdomen: Soft nontender nondistended with normal bowel sounds x4 quadrants. . Muskuloskeletal: No cyanosis, clubbing or edema noted bilaterally . Neuro: CN II-XII intact, strength, sensation, reflexes . Skin: No ulcerative lesions noted or rashes . Psychiatry: Judgement and insight appear normal. Mood is appropriate for condition and setting          Labs on Admission:  Basic Metabolic Panel: Recent Labs  Lab 01/27/20 2055  NA 138  K 3.7  CL 103  CO2 25  GLUCOSE 127*  BUN 10  CREATININE 0.63  CALCIUM 9.0   Liver Function Tests: Recent Labs  Lab 01/27/20 2055  AST 22  ALT 23  ALKPHOS 56  BILITOT 0.9  PROT 6.6  ALBUMIN 3.9   No results for input(s): LIPASE, AMYLASE in the last 168 hours. No results for input(s): AMMONIA in the last 168 hours. CBC: Recent Labs  Lab 01/27/20 2055  WBC 8.3  NEUTROABS 3.8  HGB 10.7*  HCT 35.3*  MCV 65.2*  PLT 222   Cardiac Enzymes: No results for input(s): CKTOTAL, CKMB, CKMBINDEX, TROPONINI in the last 168 hours.  BNP (last 3 results) No results for input(s): BNP in the last 8760 hours.  ProBNP (last 3 results) No results for input(s): PROBNP in the last 8760 hours.  CBG: No results for input(s): GLUCAP in the last 168 hours.  Radiological Exams on Admission: MR ANGIO HEAD WO CONTRAST  Result Date: 01/27/2020 CLINICAL DATA:  Initial evaluation for acute dizziness, hypertension. EXAM: MRI HEAD WITHOUT CONTRAST MRA HEAD WITHOUT CONTRAST TECHNIQUE: Multiplanar, multiecho pulse sequences of the brain and surrounding structures were obtained without intravenous contrast. Angiographic images of the head were obtained using MRA technique without contrast. COMPARISON:  Comparison made with prior head CT from 09/05/2016. FINDINGS: MRI HEAD FINDINGS Brain: Temporal lobe predominant cerebral atrophy noted. Patchy T2/FLAIR hyperintensity within the periventricular and deep white matter both cerebral  hemispheres most consistent with chronic small vessel ischemic disease, mild to moderate in nature. Mild patchy involvement of the pons noted. Superimposed small remote lacunar infarct noted at the right basal ganglia. 6 mm focus of diffusion abnormality seen involving the left periatrial white matter (series 3, image 29), consistent with an acute to early subacute small vessel type infarct. No associated hemorrhage or mass effect. No other diffusion abnormality to suggest acute or subacute ischemia. Gray-white matter differentiation otherwise maintained. No encephalomalacia to suggest chronic cortical infarction. No evidence for acute intracranial hemorrhage. Few scattered  punctate foci of susceptibility artifact noted involving the supratentorial brain, consistent with small chronic micro hemorrhages, favored to be hypertensive in nature. No mass lesion, midline shift or mass effect. No hydrocephalus or extra-axial fluid collection. Pituitary gland suprasellar region normal. Midline structures intact. Vascular: Major intracranial vascular flow voids are maintained. Skull and upper cervical spine: Craniocervical junction within normal limits. Upper cervical spine normal. Bone marrow signal intensity within normal limits. No scalp soft tissue abnormality. Sinuses/Orbits: Patient status post bilateral ocular lens replacement. Paranasal sinuses are largely clear. Trace right mastoid effusion noted, of doubtful significance. Inner ear structures grossly normal. Other: None. MRA HEAD FINDINGS ANTERIOR CIRCULATION: Visualized distal cervical segments of the internal carotid arteries are patent with symmetric antegrade flow. Petrous, cavernous, and supraclinoid ICAs widely patent without stenosis or other abnormality. ICA termini well perfused. A1 segments patent bilaterally. Normal anterior communicating artery complex. Anterior cerebral arteries widely patent to their distal aspects without stenosis. No M1 stenosis or  occlusion. Normal MCA bifurcations. Distal MCA branches well perfused and symmetric. POSTERIOR CIRCULATION: Vertebral arteries patent to the vertebrobasilar junction without stenosis. Left vertebral artery dominant. Both picas patent proximally. Basilar widely patent to its distal aspect without stenosis. Superior cerebral arteries patent bilaterally. Both PCAs primarily supplied via the basilar and are well perfused or distal aspects without stenosis. Small bilateral posterior communicating arteries noted. No intracranial aneurysm or other vascular abnormality. IMPRESSION: MRI HEAD IMPRESSION: 1. 6 mm focus of diffusion abnormality involving the left periatrial white matter, consistent with an acute to early subacute small vessel type infarct. No associated hemorrhage or mass effect. 2. Temporal lobe predominant cerebral atrophy with mild to moderate chronic microvascular ischemic disease. MRA HEAD IMPRESSION: Negative intracranial MRA. No large vessel occlusion. No hemodynamically significant or correctable stenosis. Electronically Signed   By: Jeannine Boga M.D.   On: 01/27/2020 21:54   MR Brain Wo Contrast (neuro protocol)  Result Date: 01/27/2020 CLINICAL DATA:  Initial evaluation for acute dizziness, hypertension. EXAM: MRI HEAD WITHOUT CONTRAST MRA HEAD WITHOUT CONTRAST TECHNIQUE: Multiplanar, multiecho pulse sequences of the brain and surrounding structures were obtained without intravenous contrast. Angiographic images of the head were obtained using MRA technique without contrast. COMPARISON:  Comparison made with prior head CT from 09/05/2016. FINDINGS: MRI HEAD FINDINGS Brain: Temporal lobe predominant cerebral atrophy noted. Patchy T2/FLAIR hyperintensity within the periventricular and deep white matter both cerebral hemispheres most consistent with chronic small vessel ischemic disease, mild to moderate in nature. Mild patchy involvement of the pons noted. Superimposed small remote lacunar  infarct noted at the right basal ganglia. 6 mm focus of diffusion abnormality seen involving the left periatrial white matter (series 3, image 29), consistent with an acute to early subacute small vessel type infarct. No associated hemorrhage or mass effect. No other diffusion abnormality to suggest acute or subacute ischemia. Gray-white matter differentiation otherwise maintained. No encephalomalacia to suggest chronic cortical infarction. No evidence for acute intracranial hemorrhage. Few scattered punctate foci of susceptibility artifact noted involving the supratentorial brain, consistent with small chronic micro hemorrhages, favored to be hypertensive in nature. No mass lesion, midline shift or mass effect. No hydrocephalus or extra-axial fluid collection. Pituitary gland suprasellar region normal. Midline structures intact. Vascular: Major intracranial vascular flow voids are maintained. Skull and upper cervical spine: Craniocervical junction within normal limits. Upper cervical spine normal. Bone marrow signal intensity within normal limits. No scalp soft tissue abnormality. Sinuses/Orbits: Patient status post bilateral ocular lens replacement. Paranasal sinuses are largely clear. Trace right mastoid  effusion noted, of doubtful significance. Inner ear structures grossly normal. Other: None. MRA HEAD FINDINGS ANTERIOR CIRCULATION: Visualized distal cervical segments of the internal carotid arteries are patent with symmetric antegrade flow. Petrous, cavernous, and supraclinoid ICAs widely patent without stenosis or other abnormality. ICA termini well perfused. A1 segments patent bilaterally. Normal anterior communicating artery complex. Anterior cerebral arteries widely patent to their distal aspects without stenosis. No M1 stenosis or occlusion. Normal MCA bifurcations. Distal MCA branches well perfused and symmetric. POSTERIOR CIRCULATION: Vertebral arteries patent to the vertebrobasilar junction without  stenosis. Left vertebral artery dominant. Both picas patent proximally. Basilar widely patent to its distal aspect without stenosis. Superior cerebral arteries patent bilaterally. Both PCAs primarily supplied via the basilar and are well perfused or distal aspects without stenosis. Small bilateral posterior communicating arteries noted. No intracranial aneurysm or other vascular abnormality. IMPRESSION: MRI HEAD IMPRESSION: 1. 6 mm focus of diffusion abnormality involving the left periatrial white matter, consistent with an acute to early subacute small vessel type infarct. No associated hemorrhage or mass effect. 2. Temporal lobe predominant cerebral atrophy with mild to moderate chronic microvascular ischemic disease. MRA HEAD IMPRESSION: Negative intracranial MRA. No large vessel occlusion. No hemodynamically significant or correctable stenosis. Electronically Signed   By: Jeannine Boga M.D.   On: 01/27/2020 21:54    EKG: I independently viewed the EKG done and my findings are as followed: Sinus rhythm at 66 bpm and prolonged QTc (52ms)  Assessment/Plan Present on Admission: . Acute CVA (cerebrovascular accident) ([place]) . Essential hypertension . Dementia ([place]) . HYPERCHOLESTEROLEMIA . PAROXYSMAL ATRIAL FIBRILLATION . Osteoporosis . NSTEMI (non-ST elevated myocardial infarction) ([place])  Active Problems:   HYPERCHOLESTEROLEMIA   Essential hypertension   PAROXYSMAL ATRIAL FIBRILLATION   Osteoporosis   Dementia ([facility])   NSTEMI (non-ST elevated myocardial infarction) ([place])   Acute CVA (cerebrovascular accident) ([place])   Microcytic anemia   Acute CVA  MRI brain showed 6 mm focus of diffusion abnormality involving the left parietal white matter consistent with an acute to wholly subacute small vessel type infarct.  MRA head showed negative intracranial MRA and no large vessel occlusion with no hemodynamically significant or correctable stenosis.   Continue aspirin 81 mg  Continue fall  precautions and neuro checks Lipid panel and hemoglobin A1c will be checked Continue PT/ST/OT eval and treat Bedside swallow eval by nursing prior to diet Permissive hypertension for 24 hours will be allowed Patient just had an echocardiogram done on 11/16/2019 that showed LVEF 35-40% with LV demonstrating global hypokinesis and left ventricular internal cavity size mildly dilated with mild LV hypertrophy.  LV diastolic parameters are consistent with grade 1 diastolic dysfunction (impaired relaxation).  LA size was moderately dilated.  Consider a repeat echocardiogram Please contact neurologist when patient [facility] at [facility]  Hypertensive urgency (resolved) Essential hypertension Permissive hypertension for 24 hours will be allowed; Resume home BP meds afterwards  Prolonged QTc QTc 543ms Avoid QT prolonging drugs Magnesium level will be checked Repeat EKG in the morning  Microcytic anemia H/H 10.7/35.3, MCV 65.2 Iron studies will be checked  Questionable UTI POA Urinalysis was positive for nitrites, leukocytes-large and WBC> 50, however patient denies any burning sensation on urination, urinary frequency or urgency or any other irritative bladder symptoms. Patient was started on IV ciprofloxacin.  No indication for further antibiotics at this time  Dementia Continue home Aricept and Namenda  History of paroxysmal atrial fibrillation Patient is currently in sinus rhythm, no anticoagulant noted in patient's med rec, we  shall await updated med rec Continue home Coreg once patient is out of permissive hypertension window  NSTEMI Continue home meds Home once med rec is updated   DVT prophylaxis: SCDs  Code Status: DNR  Family Communication: Son at bedside (all questions answered satisfaction)  Disposition Plan:  Patient is from:                        home Anticipated DC to:                   SNF or family members home Anticipated DC date:               2-3  days Anticipated DC barriers:         Patient is currently not stable to discharge; pending neurology evaluation and recommendation   Consults called: [facility] neurology per ED physician  Admission status: Inpatient    Bernadette Hoit MD [facility]  If 7PM-7AM, please contact night-coverage [email or web address]  01/28/2020, 1:29 AM

## 2020-01-27 NOTE — ED Triage Notes (Signed)
Pt has been having dizziness x one week and was hypertensive today. Pt states the dizziness is worse when she moves her head.

## 2020-01-28 DIAGNOSIS — I10 Essential (primary) hypertension: Secondary | ICD-10-CM | POA: Diagnosis not present

## 2020-01-28 DIAGNOSIS — I480 Paroxysmal atrial fibrillation: Secondary | ICD-10-CM

## 2020-01-28 DIAGNOSIS — N390 Urinary tract infection, site not specified: Secondary | ICD-10-CM

## 2020-01-28 DIAGNOSIS — R42 Dizziness and giddiness: Secondary | ICD-10-CM

## 2020-01-28 DIAGNOSIS — I639 Cerebral infarction, unspecified: Secondary | ICD-10-CM

## 2020-01-28 DIAGNOSIS — D509 Iron deficiency anemia, unspecified: Secondary | ICD-10-CM

## 2020-01-28 DIAGNOSIS — F0390 Unspecified dementia without behavioral disturbance: Secondary | ICD-10-CM | POA: Diagnosis not present

## 2020-01-28 DIAGNOSIS — E7800 Pure hypercholesterolemia, unspecified: Secondary | ICD-10-CM | POA: Diagnosis not present

## 2020-01-28 LAB — LIPID PANEL
Cholesterol: 144 mg/dL (ref 0–200)
HDL: 50 mg/dL (ref >40)
LDL Cholesterol: 79 mg/dL (ref 0–99)
Total CHOL/HDL Ratio: 2.9 RATIO
Triglycerides: 76 mg/dL (ref <150)
VLDL: 15 mg/dL (ref 0–40)

## 2020-01-28 LAB — CBC
HCT: 33.7 % — ABNORMAL LOW (ref 36.0–46.0)
Hemoglobin: 10.2 g/dL — ABNORMAL LOW (ref 12.0–15.0)
MCH: 19.8 pg — ABNORMAL LOW (ref 26.0–34.0)
MCHC: 30.3 g/dL (ref 30.0–36.0)
MCV: 65.3 fL — ABNORMAL LOW (ref 80.0–100.0)
Platelets: 231 10*3/uL (ref 150–400)
RBC: 5.16 MIL/uL — ABNORMAL HIGH (ref 3.87–5.11)
RDW: 14.9 % (ref 11.5–15.5)
WBC: 10.5 10*3/uL (ref 4.0–10.5)
nRBC: 0 % (ref 0.0–0.2)

## 2020-01-28 LAB — COMPREHENSIVE METABOLIC PANEL
ALT: 22 U/L (ref 0–44)
AST: 21 U/L (ref 15–41)
Albumin: 3.9 g/dL (ref 3.5–5.0)
Alkaline Phosphatase: 52 U/L (ref 38–126)
Anion gap: 9 (ref 5–15)
BUN: 9 mg/dL (ref 8–23)
CO2: 27 mmol/L (ref 22–32)
Calcium: 9 mg/dL (ref 8.9–10.3)
Chloride: 104 mmol/L (ref 98–111)
Creatinine, Ser: 0.61 mg/dL (ref 0.44–1.00)
GFR calc Af Amer: >60 mL/min (ref >60)
GFR calc non Af Amer: >60 mL/min (ref >60)
Glucose, Bld: 128 mg/dL — ABNORMAL HIGH (ref 70–99)
Potassium: 3.6 mmol/L (ref 3.5–5.1)
Sodium: 140 mmol/L (ref 135–145)
Total Bilirubin: 0.9 mg/dL (ref 0.3–1.2)
Total Protein: 6.1 g/dL — ABNORMAL LOW (ref 6.5–8.1)

## 2020-01-28 LAB — IRON AND TIBC
Iron: 95 ug/dL (ref 28–170)
Saturation Ratios: 31 % (ref 10.4–31.8)
TIBC: 308 ug/dL (ref 250–450)
UIBC: 213 ug/dL

## 2020-01-28 LAB — PROTIME-INR
INR: 1.1 (ref 0.8–1.2)
Prothrombin Time: 13.5 seconds (ref 11.4–15.2)

## 2020-01-28 LAB — APTT: aPTT: 28 seconds (ref 24–36)

## 2020-01-28 LAB — HEMOGLOBIN A1C
Hgb A1c MFr Bld: 6.1 % — ABNORMAL HIGH (ref 4.8–5.6)
Mean Plasma Glucose: 128.37 mg/dL

## 2020-01-28 LAB — PHOSPHORUS: Phosphorus: 3.7 mg/dL (ref 2.5–4.6)

## 2020-01-28 LAB — FERRITIN: Ferritin: 511 ng/mL — ABNORMAL HIGH (ref 11–307)

## 2020-01-28 LAB — MAGNESIUM: Magnesium: 1.9 mg/dL (ref 1.7–2.4)

## 2020-01-28 MED ORDER — SIMVASTATIN 20 MG PO TABS
40.0000 mg | ORAL_TABLET | Freq: Every evening | ORAL | Status: DC
  Administered 2020-01-28 – 2020-01-29 (×2): 40 mg via ORAL
  Filled 2020-01-28: qty 2
  Filled 2020-01-28: qty 4

## 2020-01-28 MED ORDER — ASPIRIN EC 325 MG PO TBEC
325.0000 mg | DELAYED_RELEASE_TABLET | Freq: Every day | ORAL | Status: DC
  Administered 2020-01-28 – 2020-01-30 (×3): 325 mg via ORAL
  Filled 2020-01-28 (×3): qty 1

## 2020-01-28 MED ORDER — NORTRIPTYLINE HCL 25 MG PO CAPS
25.0000 mg | ORAL_CAPSULE | Freq: Every day | ORAL | Status: DC
  Administered 2020-01-28 – 2020-01-29 (×2): 25 mg via ORAL
  Filled 2020-01-28 (×5): qty 1

## 2020-01-28 MED ORDER — MEMANTINE HCL 10 MG PO TABS
10.0000 mg | ORAL_TABLET | Freq: Two times a day (BID) | ORAL | Status: DC
  Administered 2020-01-28 – 2020-01-30 (×5): 10 mg via ORAL
  Filled 2020-01-28 (×12): qty 1

## 2020-01-28 MED ORDER — ASPIRIN 300 MG RE SUPP: 300.0000 mg | Freq: Every day | RECTAL | Status: DC

## 2020-01-28 MED ORDER — MECLIZINE HCL 12.5 MG PO TABS
12.5000 mg | ORAL_TABLET | Freq: Four times a day (QID) | ORAL | Status: DC | PRN
  Administered 2020-01-28 (×2): 12.5 mg via ORAL
  Filled 2020-01-28 (×2): qty 1

## 2020-01-28 MED ORDER — HEPARIN SODIUM (PORCINE) 5000 UNIT/ML IJ SOLN
5000.0000 [IU] | Freq: Three times a day (TID) | INTRAMUSCULAR | Status: DC
  Administered 2020-01-28 – 2020-01-30 (×6): 5000 [IU] via SUBCUTANEOUS
  Filled 2020-01-28 (×6): qty 1

## 2020-01-28 MED ORDER — ASPIRIN EC 325 MG PO TBEC: 325.0000 mg | DELAYED_RELEASE_TABLET | Freq: Every day | ORAL | Status: DC

## 2020-01-28 MED ORDER — ISOSORBIDE MONONITRATE ER 30 MG PO TB24
15.0000 mg | ORAL_TABLET | Freq: Every evening | ORAL | Status: DC
  Administered 2020-01-29: 15 mg via ORAL
  Filled 2020-01-28 (×4): qty 1

## 2020-01-28 MED ORDER — TAMSULOSIN HCL 0.4 MG PO CAPS
0.4000 mg | ORAL_CAPSULE | Freq: Every day | ORAL | Status: DC
  Administered 2020-01-28 – 2020-01-29 (×2): 0.4 mg via ORAL
  Filled 2020-01-28 (×2): qty 1

## 2020-01-28 MED ORDER — CARVEDILOL 6.25 MG PO TABS
6.2500 mg | ORAL_TABLET | Freq: Two times a day (BID) | ORAL | Status: DC
  Administered 2020-01-28 – 2020-01-30 (×4): 6.25 mg via ORAL
  Filled 2020-01-28 (×4): qty 1

## 2020-01-28 MED ORDER — CYCLOSPORINE 0.05 % OP EMUL
1.0000 [drp] | Freq: Two times a day (BID) | OPHTHALMIC | Status: DC
  Administered 2020-01-28 – 2020-01-29 (×2): 1 [drp] via OPHTHALMIC
  Filled 2020-01-28 (×8): qty 1

## 2020-01-28 MED ORDER — DONEPEZIL HCL 5 MG PO TABS
5.0000 mg | ORAL_TABLET | Freq: Every day | ORAL | Status: DC
  Administered 2020-01-28 – 2020-01-29 (×2): 5 mg via ORAL
  Filled 2020-01-28 (×6): qty 1

## 2020-01-28 NOTE — Consult Note (Signed)
Referring Physician: Dr. Dyann Kief    Chief Complaint: Dizziness  HPI: [name] is an 86 y.o. female resident of an assisted living facility with a PMHx of dementia, DM2, HTN, HLD, osteoporosis, cardiomyopathy and atrial fibrillation (not on any blood thinners), who initially presented to the [facility] on Thursday evening with a chief complaint of dizziness x 1 week in the context of being noted to be hypertensive on Thursday. She had stated that the dizziness was worse when she moved her head. Her son also endorsed some confusion starting about 2 days prior to presenting. She denied headache, visual changes, speech deficit, numbness or motor weakness. Her BP in the ED was initially 200/80.   Home medications include ASA 81 mg qd, simvastatin, Namenda and Aricept.   ED course: Labs revealed: - Microcytic anemia - Urinalysis positive for nitrites, leukocytes-large and WBC> 50 - UDS negative - MRI brain with 6 mm focus of diffusion abnormality involving the left parietal white matter consistent with an acute to early subacute small vessel type infarct.   - MRA head showed no large vessel occlusion with no hemodynamically significant or correctable stenosis  EKG:  Sinus rhythm.  Prolonged QT interval.    Past Medical History:  Diagnosis Date  . Atrial fibrillation ([place])   . Cardiomyopathy ([place])    a. 10/2019: EF 35-40% by echo --> medical management recommended.   . Dementia ([place])   . Diverticulitis 8/09, 3/10   Abscess in 2009 (medical tx)  . Essential hypertension   . Hyperlipidemia   . Hyperthyroidism    Radiation, surgery  . IDA (iron deficiency anemia)   . Osteoporosis   . Postoperative anemia due to acute blood loss 09/08/2016  . Thalassemia   . Tubular adenoma of colon 12/15/08   Colonosocpy Dr Tonita Cong removed cecum  . Type 2 diabetes mellitus ([place])     Past Surgical History:  Procedure Laterality Date  . CESAREAN SECTION     x2  . CHOLECYSTECTOMY    . FOOT SURGERY      bilat  . INTRAMEDULLARY (IM) NAIL INTERTROCHANTERIC Left 09/06/2016   Procedure: LEFT HIP INTRAMEDULLARY (IM) NAIL;  Surgeon: Renette Butters, MD;  Location: [facility];  Service: Orthopedics;  Laterality: Left;  . THYROIDECTOMY, PARTIAL      Family History  Problem Relation Age of Onset  . Pneumonia Father   . Coronary artery disease Mother   . Stroke Mother   . Colon cancer Mother 41  . Thalassemia Sister    Social History:  reports that she has never smoked. She has never used smokeless tobacco. She reports that she does not drink alcohol and does not use drugs.  Allergies:  Allergies  Allergen Reactions  . Bee Venom     Listed per Nursing Center-No reaction is noted  . Carbapenems     Listed per Nursing Center-No reaction is noted  . Cephalosporins     Listed per Nursing Center-No reaction is noted  . Procaine Hcl Other (See Comments)    Numbness/jerkiness  . Sulfonamide Derivatives Other (See Comments)    hypertension  . Penicillins Rash    Medications:  Prior to Admission:  Medications Prior to Admission  Medication Sig Dispense Refill Last Dose  . acetaminophen (TYLENOL) 325 MG tablet Take 2 tablets (650 mg total) by mouth every 6 (six) hours as needed for mild pain, fever or headache. 30 tablet 1 01/24/2020 at Unknown time  . alendronate (FOSAMAX) 70 MG tablet Take 70  mg by mouth every Wednesday. Take with a full glass of water on an empty stomach.   01/26/2020 at Unknown time  . aspirin 81 MG EC tablet Take 1 tablet (81 mg total) by mouth daily with breakfast. 30 tablet 2 01/27/2020 at Unknown time  . calcium citrate-vitamin D 500-400 MG-UNIT chewable tablet Chew by mouth 2 (two) times daily.   01/27/2020 at Unknown time  . carvedilol (COREG) 6.25 MG tablet Take 1 tablet (6.25 mg total) by mouth 2 (two) times daily with a meal. 60 tablet 3 01/27/2020 at 1700  . Cranberry 450 MG TABS Take 450 mg by mouth 2 (two) times daily.   01/27/2020 at Unknown time  . cycloSPORINE  (RESTASIS) 0.05 % ophthalmic emulsion Place 1 drop into both eyes 2 (two) times daily.   01/27/2020 at Unknown time  . donepezil (ARICEPT) 5 MG tablet Take 1 tablet (5 mg total) by mouth at bedtime. 30 tablet 3 01/27/2020 at Unknown time  . ferrous sulfate 325 (65 FE) MG tablet Take 325 mg by mouth in the morning and at bedtime.   01/27/2020 at Unknown time  . fish oil-omega-3 fatty acids 1000 MG capsule Take 2 g by mouth daily.     01/27/2020 at Unknown time  . iron polysaccharides (NIFEREX) 150 MG capsule Take 1 capsule (150 mg total) by mouth 2 (two) times daily. 60 capsule 4 01/27/2020 at Unknown time  . isosorbide mononitrate (IMDUR) 30 MG 24 hr tablet Take 0.5 tablets (15 mg total) by mouth every evening. 30 tablet 3 01/27/2020 at Unknown time  . losartan (COZAAR) 50 MG tablet Take 1 tablet (50 mg total) by mouth daily. 30 tablet 3 01/27/2020 at Unknown time  . meclizine (ANTIVERT) 12.5 MG tablet Take 12.5 mg by mouth every 6 (six) hours as needed for dizziness.   01/27/2020 at Unknown time  . memantine (NAMENDA) 10 MG tablet Take 1 tablet (10 mg total) by mouth 2 (two) times daily. 60 tablet 3 01/27/2020 at Unknown time  . nortriptyline (PAMELOR) 25 MG capsule Take 25 mg by mouth at bedtime.   01/27/2020 at Unknown time  . senna-docusate (SENOKOT-S) 8.6-50 MG tablet Take 2 tablets by mouth at bedtime. 60 tablet 11 01/27/2020 at Unknown time  . simvastatin (ZOCOR) 40 MG tablet Take 1 tablet (40 mg total) by mouth every evening. 30 tablet 3 01/27/2020 at Unknown time  . spironolactone (ALDACTONE) 25 MG tablet Take 0.5 tablets (12.5 mg total) by mouth daily. 30 tablet 3 01/27/2020 at Unknown time  . tamsulosin (FLOMAX) 0.4 MG CAPS capsule Take 1 capsule (0.4 mg total) by mouth daily after supper. 30 capsule 0 01/27/2020 at Unknown time  . lidocaine (LIDODERM) 5 % Place 1 patch onto the skin daily. Remove & Discard patch within 12 hours or as directed by MD (Patient not taking: Reported on 01/27/2020) 15 patch 0  Not Taking at Unknown time   Scheduled: . aspirin EC  325 mg Oral Daily  . carvedilol  6.25 mg Oral BID WC  . cycloSPORINE  1 drop Both Eyes BID  . donepezil  5 mg Oral QHS  . heparin injection (subcutaneous)  5,000 Units Subcutaneous Q8H  . isosorbide mononitrate  15 mg Oral QPM  . memantine  10 mg Oral BID  . nortriptyline  25 mg Oral QHS  . simvastatin  40 mg Oral QPM  . tamsulosin  0.4 mg Oral QPC supper   Continuous:   ROS: As per HPI. Does not endorse  additional symptoms at the time of Neurological evaluation.   Physical Examination: Blood pressure (!) 204/76, pulse 77, temperature (!) 97.5 F (36.4 C), temperature source Oral, resp. rate 17, height 5\' 2"  (1.575 m), weight 68 kg, SpO2 95 %.   HEENT: [place]/AT Lungs: Respirations unlabored Ext: No edema  Neurologic Examination: Mental Status: Awake and alert. Not correctly oriented to the day of week, month or year. Oriented to the state. Not oriented to the correct city. Pleasant and cooperative. Speech fluent with intact comprehension, repetition and naming.  Cranial Nerves: II:  Visual fields intact with no extinction to DSS. Pupils are pinpoint - difficult to assess reactivity.  III,IV, VI: No ptosis. EOMI. No nystagmus.  V,VII: Smile symmetric. Facial temp sensation equal bilaterally VIII: Hearing intact to conversation IX,X: No hypophonia XI: Symmetric shoulder shrug XII: midline tongue extension  Motor: Right : Upper extremity   5/5    Left:     Upper extremity   5/5  Lower extremity   5/5     Lower extremity   5/5 No pronator drift Sensory: Temp and light touch sensation intact throughout, bilaterally. No extinction to DSS.  Deep Tendon Reflexes:  2+ right biceps and brachioradialis 1+ left biceps and brachioradialis 0 patellae and achilles bilaterally Tonically upgoing toes.  Cerebellar: No ataxia with FNF bilaterally Gait: Deferred  Results for orders placed or performed during the hospital encounter of  01/27/20 (from the past 48 hour(s))  CBC with Differential/Platelet     Status: Abnormal   Collection Time: 01/27/20  8:55 PM  Result Value Ref Range   WBC 8.3 4.0 - 10.5 K/uL   RBC 5.41 (H) 3.87 - 5.11 MIL/uL   Hemoglobin 10.7 (L) 12.0 - 15.0 g/dL   HCT 35.3 (L) 36 - 46 %   MCV 65.2 (L) 80.0 - 100.0 fL   MCH 19.8 (L) 26.0 - 34.0 pg   MCHC 30.3 30.0 - 36.0 g/dL    Comment: CORRECTED FOR COLD AGGLUTININS   RDW 15.4 11.5 - 15.5 %   Platelets 222 150 - 400 K/uL   nRBC 0.0 0.0 - 0.2 %   Neutrophils Relative % 46 %   Neutro Abs 3.8 1.7 - 7.7 K/uL   Lymphocytes Relative 38 %   Lymphs Abs 3.1 0.7 - 4.0 K/uL   Monocytes Relative 13 %   Monocytes Absolute 1.1 (H) 0 - 1 K/uL   Eosinophils Relative 3 %   Eosinophils Absolute 0.3 0 - 0 K/uL   Basophils Relative 0 %   Basophils Absolute 0.0 0 - 0 K/uL   Immature Granulocytes 0 %   Abs Immature Granulocytes 0.03 0.00 - 0.07 K/uL    Comment: Performed at [facility], [address], [place], [place] [address]  Comprehensive metabolic panel     Status: Abnormal   Collection Time: 01/27/20  8:55 PM  Result Value Ref Range   Sodium 138 135 - 145 mmol/L   Potassium 3.7 3.5 - 5.1 mmol/L   Chloride 103 98 - 111 mmol/L   CO2 25 22 - 32 mmol/L   Glucose, Bld 127 (H) 70 - 99 mg/dL    Comment: Glucose reference range applies only to samples taken after fasting for at least 8 hours.   BUN 10 8 - 23 mg/dL   Creatinine, Ser 0.63 0.44 - 1.00 mg/dL   Calcium 9.0 8.9 - 10.3 mg/dL   Total Protein 6.6 6.5 - 8.1 g/dL   Albumin 3.9 3.5 - 5.0 g/dL  AST 22 15 - 41 U/L   ALT 23 0 - 44 U/L   Alkaline Phosphatase 56 38 - 126 U/L   Total Bilirubin 0.9 0.3 - 1.2 mg/dL   GFR calc non Af Amer >60 >60 mL/min   GFR calc Af Amer >60 >60 mL/min   Anion gap 10 5 - 15    Comment: Performed at [facility], [address] [place], [place] [address]  Ethanol     Status: None   Collection Time: 01/27/20  8:55 PM  Result Value Ref Range   Alcohol, Ethyl (B) <10 <10  mg/dL    Comment: (NOTE) Lowest detectable limit for serum alcohol is 10 mg/dL.  For medical purposes only. Performed at [facility], [address], [place], [place] [address]   Protime-INR     Status: None   Collection Time: 01/27/20  8:55 PM  Result Value Ref Range   Prothrombin Time 12.9 11.4 - 15.2 seconds   INR 1.0 0.8 - 1.2    Comment: (NOTE) INR goal varies based on device and disease states. Performed at [facility], [address], [place], [place] [address]   APTT     Status: None   Collection Time: 01/27/20  8:55 PM  Result Value Ref Range   aPTT 27 24 - 36 seconds    Comment: Performed at [facility], [address], [place], [place] [address]  Lipid panel     Status: None   Collection Time: 01/27/20  8:55 PM  Result Value Ref Range   Cholesterol 144 0 - 200 mg/dL   Triglycerides 76 <150 mg/dL   HDL 50 >40 mg/dL   Total CHOL/HDL Ratio 2.9 RATIO   VLDL 15 0 - 40 mg/dL   LDL Cholesterol 79 0 - 99 mg/dL    Comment:        Total Cholesterol/HDL:CHD Risk Coronary Heart Disease Risk Table                     Men   Women  1/2 Average Risk   3.4   3.3  Average Risk       5.0   4.4  2 X Average Risk   9.6   7.1  3 X Average Risk  23.4   11.0        Use the calculated Patient Ratio above and the CHD Risk Table to determine the patient's CHD Risk.        ATP III CLASSIFICATION (LDL):  <100     mg/dL   Optimal  100-129  mg/dL   Near or Above                    Optimal  130-159  mg/dL   Borderline  160-189  mg/dL   High  >190     mg/dL   Very High Performed at [facility] [place], [place] [address]   Hemoglobin A1c     Status: Abnormal   Collection Time: 01/27/20  8:55 PM  Result Value Ref Range   Hgb A1c MFr Bld 6.1 (H) 4.8 - 5.6 %    Comment: (NOTE) Pre diabetes:          5.7%-6.4%  Diabetes:              >6.4%  Glycemic control for   <7.0% adults with diabetes    Mean Plasma Glucose 128.37 mg/dL    Comment: Performed at [facility], [place] [address] [place], [place] [address]  Ferritin     Status: Abnormal   Collection Time: 01/27/20  8:55 PM  Result Value Ref Range   Ferritin 511 (H) 11 - 307 ng/mL    Comment: Performed at [facility], [address] [place], [place] [address]  Iron and TIBC     Status: None   Collection Time: 01/27/20  8:55 PM  Result Value Ref Range   Iron 95 28 - 170 ug/dL   TIBC 308 250 - 450 ug/dL   Saturation Ratios 31 10.4 - 31.8 %   UIBC 213 ug/dL    Comment: Performed at [facility], [address] [place], [place] [address]  Urinalysis, Routine w reflex microscopic     Status: Abnormal   Collection Time: 01/27/20  9:50 PM  Result Value Ref Range   Color, Urine YELLOW YELLOW   APPearance HAZY (A) CLEAR   Specific Gravity, Urine 1.003 (L) 1.005 - 1.030   pH 8.0 5.0 - 8.0   Glucose, UA NEGATIVE NEGATIVE mg/dL   Hgb urine dipstick SMALL (A) NEGATIVE   Bilirubin Urine NEGATIVE NEGATIVE   Ketones, ur NEGATIVE NEGATIVE mg/dL   Protein, ur NEGATIVE NEGATIVE mg/dL   Nitrite POSITIVE (A) NEGATIVE   Leukocytes,Ua LARGE (A) NEGATIVE   RBC / HPF 0-5 0 - 5 RBC/hpf   WBC, UA >50 (H) 0 - 5 WBC/hpf   Bacteria, UA RARE (A) NONE SEEN    Comment: Performed at [facility], [address] [place], [place] [address]  SARS Coronavirus 2 by RT PCR (hospital order, performed in [place] hospital lab) Nasopharyngeal Nasopharyngeal Swab     Status: None   Collection Time: 01/27/20 10:15 PM   Specimen: Nasopharyngeal Swab  Result Value Ref Range   SARS Coronavirus 2 NEGATIVE NEGATIVE    Comment: (NOTE) SARS-CoV-2 target nucleic acids are NOT DETECTED.  The SARS-CoV-2 RNA is generally detectable in upper and lower respiratory specimens during the acute phase of infection. The lowest concentration of SARS-CoV-2 viral copies this assay can detect is 250 copies / mL. A negative result does not preclude SARS-CoV-2 infection and should not be used as the sole basis for treatment or  other patient management decisions.  A negative result may occur with improper specimen collection / handling, submission of specimen other than nasopharyngeal swab, presence of viral mutation(s) within the areas targeted by this assay, and inadequate number of viral copies (<250 copies / mL). A negative result must be combined with clinical observations, patient history, and epidemiological information.  Fact Sheet for Patients:   StrictlyIdeas.no  Fact Sheet for Healthcare Providers: BankingDealers.co.za  This test is not yet approved or  cleared by the [place] FDA and has been authorized for detection and/or diagnosis of SARS-CoV-2 by FDA under an Emergency Use Authorization (EUA).  This EUA will remain in effect (meaning this test can be used) for the duration of the COVID-19 declaration under Section 564(b)(1) of the Act, 21 U.S.C. section 360bbb-3(b)(1), unless the authorization is terminated or revoked sooner.  Performed at [facility], [address] [place], [place] [address]   Urine rapid drug screen (hosp performed)     Status: None   Collection Time: 01/27/20 10:31 PM  Result Value Ref Range   Opiates NONE DETECTED NONE DETECTED   Cocaine NONE DETECTED NONE DETECTED   Benzodiazepines NONE DETECTED NONE DETECTED   Amphetamines NONE DETECTED NONE DETECTED   Tetrahydrocannabinol NONE DETECTED NONE DETECTED  Barbiturates NONE DETECTED NONE DETECTED    Comment: (NOTE) DRUG SCREEN FOR MEDICAL PURPOSES ONLY.  IF CONFIRMATION IS NEEDED FOR ANY PURPOSE, NOTIFY LAB WITHIN 5 DAYS.  LOWEST DETECTABLE LIMITS FOR URINE DRUG SCREEN Drug Class                     Cutoff (ng/mL) Amphetamine and metabolites    1000 Barbiturate and metabolites    200 Benzodiazepine                 008 Tricyclics and metabolites     300 Opiates and metabolites        300 Cocaine and metabolites        300 THC                             50 Performed at [facility]., [place], [place] [address]   Comprehensive metabolic panel     Status: Abnormal   Collection Time: 01/28/20  4:13 AM  Result Value Ref Range   Sodium 140 135 - 145 mmol/L   Potassium 3.6 3.5 - 5.1 mmol/L   Chloride 104 98 - 111 mmol/L   CO2 27 22 - 32 mmol/L   Glucose, Bld 128 (H) 70 - 99 mg/dL    Comment: Glucose reference range applies only to samples taken after fasting for at least 8 hours.   BUN 9 8 - 23 mg/dL   Creatinine, Ser 0.61 0.44 - 1.00 mg/dL   Calcium 9.0 8.9 - 10.3 mg/dL   Total Protein 6.1 (L) 6.5 - 8.1 g/dL   Albumin 3.9 3.5 - 5.0 g/dL   AST 21 15 - 41 U/L   ALT 22 0 - 44 U/L   Alkaline Phosphatase 52 38 - 126 U/L   Total Bilirubin 0.9 0.3 - 1.2 mg/dL   GFR calc non Af Amer >60 >60 mL/min   GFR calc Af Amer >60 >60 mL/min   Anion gap 9 5 - 15    Comment: Performed at [facility], [address] [place], [place] [address]  CBC     Status: Abnormal   Collection Time: 01/28/20  4:13 AM  Result Value Ref Range   WBC 10.5 4.0 - 10.5 K/uL   RBC 5.16 (H) 3.87 - 5.11 MIL/uL   Hemoglobin 10.2 (L) 12.0 - 15.0 g/dL   HCT 33.7 (L) 36 - 46 %   MCV 65.3 (L) 80.0 - 100.0 fL   MCH 19.8 (L) 26.0 - 34.0 pg   MCHC 30.3 30.0 - 36.0 g/dL    Comment: CORRECTED FOR COLD AGGLUTININS   RDW 14.9 11.5 - 15.5 %   Platelets 231 150 - 400 K/uL   nRBC 0.0 0.0 - 0.2 %    Comment: Performed at [facility], [address], [place], [place] [address]  Protime-INR     Status: None   Collection Time: 01/28/20  4:13 AM  Result Value Ref Range   Prothrombin Time 13.5 11.4 - 15.2 seconds   INR 1.1 0.8 - 1.2    Comment: (NOTE) INR goal varies based on device and disease states. Performed at [facility], [address] [place],  [address]   APTT     Status: None   Collection Time: 01/28/20  4:13 AM  Result Value Ref Range   aPTT 28 24 - 36 seconds    Comment: Performed at [facility], [place]  [address] [place], [place] [address]   Magnesium     Status: None   Collection Time: 01/28/20  4:13 AM  Result Value Ref Range   Magnesium 1.9 1.7 - 2.4 mg/dL    Comment: Performed at [facility], [address] [place], [place] [address]  Phosphorus     Status: None   Collection Time: 01/28/20  4:13 AM  Result Value Ref Range   Phosphorus 3.7 2.5 - 4.6 mg/dL    Comment: Performed at [facility], [address], [place], [place] [address]   MR ANGIO HEAD WO CONTRAST  Result Date: 01/27/2020 CLINICAL DATA:  Initial evaluation for acute dizziness, hypertension. EXAM: MRI HEAD WITHOUT CONTRAST MRA HEAD WITHOUT CONTRAST TECHNIQUE: Multiplanar, multiecho pulse sequences of the brain and surrounding structures were obtained without intravenous contrast. Angiographic images of the head were obtained using MRA technique without contrast. COMPARISON:  Comparison made with prior head CT from 09/05/2016. FINDINGS: MRI HEAD FINDINGS Brain: Temporal lobe predominant cerebral atrophy noted. Patchy T2/FLAIR hyperintensity within the periventricular and deep white matter both cerebral hemispheres most consistent with chronic small vessel ischemic disease, mild to moderate in nature. Mild patchy involvement of the pons noted. Superimposed small remote lacunar infarct noted at the right basal ganglia. 6 mm focus of diffusion abnormality seen involving the left periatrial white matter (series 3, image 29), consistent with an acute to early subacute small vessel type infarct. No associated hemorrhage or mass effect. No other diffusion abnormality to suggest acute or subacute ischemia. Gray-white matter differentiation otherwise maintained. No encephalomalacia to suggest chronic cortical infarction. No evidence for acute intracranial hemorrhage. Few scattered punctate foci of susceptibility artifact noted involving the supratentorial brain, consistent with small chronic micro hemorrhages, favored to be hypertensive in nature. No mass lesion, midline shift or  mass effect. No hydrocephalus or extra-axial fluid collection. Pituitary gland suprasellar region normal. Midline structures intact. Vascular: Major intracranial vascular flow voids are maintained. Skull and upper cervical spine: Craniocervical junction within normal limits. Upper cervical spine normal. Bone marrow signal intensity within normal limits. No scalp soft tissue abnormality. Sinuses/Orbits: Patient status post bilateral ocular lens replacement. Paranasal sinuses are largely clear. Trace right mastoid effusion noted, of doubtful significance. Inner ear structures grossly normal. Other: None. MRA HEAD FINDINGS ANTERIOR CIRCULATION: Visualized distal cervical segments of the internal carotid arteries are patent with symmetric antegrade flow. Petrous, cavernous, and supraclinoid ICAs widely patent without stenosis or other abnormality. ICA termini well perfused. A1 segments patent bilaterally. Normal anterior communicating artery complex. Anterior cerebral arteries widely patent to their distal aspects without stenosis. No M1 stenosis or occlusion. Normal MCA bifurcations. Distal MCA branches well perfused and symmetric. POSTERIOR CIRCULATION: Vertebral arteries patent to the vertebrobasilar junction without stenosis. Left vertebral artery dominant. Both picas patent proximally. Basilar widely patent to its distal aspect without stenosis. Superior cerebral arteries patent bilaterally. Both PCAs primarily supplied via the basilar and are well perfused or distal aspects without stenosis. Small bilateral posterior communicating arteries noted. No intracranial aneurysm or other vascular abnormality. IMPRESSION: MRI HEAD IMPRESSION: 1. 6 mm focus of diffusion abnormality involving the left periatrial white matter, consistent with an acute to early subacute small vessel type infarct. No associated hemorrhage or mass effect. 2. Temporal lobe predominant cerebral atrophy with mild to moderate chronic microvascular  ischemic disease. MRA HEAD IMPRESSION: Negative intracranial MRA. No large vessel occlusion. No hemodynamically significant or correctable stenosis. Electronically Signed   By: Jeannine Boga M.D.   On: 01/27/2020 21:54  MR Brain Wo Contrast (neuro protocol)  Result Date: 01/27/2020 CLINICAL DATA:  Initial evaluation for acute dizziness, hypertension. EXAM: MRI HEAD WITHOUT CONTRAST MRA HEAD WITHOUT CONTRAST TECHNIQUE: Multiplanar, multiecho pulse sequences of the brain and surrounding structures were obtained without intravenous contrast. Angiographic images of the head were obtained using MRA technique without contrast. COMPARISON:  Comparison made with prior head CT from 09/05/2016. FINDINGS: MRI HEAD FINDINGS Brain: Temporal lobe predominant cerebral atrophy noted. Patchy T2/FLAIR hyperintensity within the periventricular and deep white matter both cerebral hemispheres most consistent with chronic small vessel ischemic disease, mild to moderate in nature. Mild patchy involvement of the pons noted. Superimposed small remote lacunar infarct noted at the right basal ganglia. 6 mm focus of diffusion abnormality seen involving the left periatrial white matter (series 3, image 29), consistent with an acute to early subacute small vessel type infarct. No associated hemorrhage or mass effect. No other diffusion abnormality to suggest acute or subacute ischemia. Gray-white matter differentiation otherwise maintained. No encephalomalacia to suggest chronic cortical infarction. No evidence for acute intracranial hemorrhage. Few scattered punctate foci of susceptibility artifact noted involving the supratentorial brain, consistent with small chronic micro hemorrhages, favored to be hypertensive in nature. No mass lesion, midline shift or mass effect. No hydrocephalus or extra-axial fluid collection. Pituitary gland suprasellar region normal. Midline structures intact. Vascular: Major intracranial vascular flow  voids are maintained. Skull and upper cervical spine: Craniocervical junction within normal limits. Upper cervical spine normal. Bone marrow signal intensity within normal limits. No scalp soft tissue abnormality. Sinuses/Orbits: Patient status post bilateral ocular lens replacement. Paranasal sinuses are largely clear. Trace right mastoid effusion noted, of doubtful significance. Inner ear structures grossly normal. Other: None. MRA HEAD FINDINGS ANTERIOR CIRCULATION: Visualized distal cervical segments of the internal carotid arteries are patent with symmetric antegrade flow. Petrous, cavernous, and supraclinoid ICAs widely patent without stenosis or other abnormality. ICA termini well perfused. A1 segments patent bilaterally. Normal anterior communicating artery complex. Anterior cerebral arteries widely patent to their distal aspects without stenosis. No M1 stenosis or occlusion. Normal MCA bifurcations. Distal MCA branches well perfused and symmetric. POSTERIOR CIRCULATION: Vertebral arteries patent to the vertebrobasilar junction without stenosis. Left vertebral artery dominant. Both picas patent proximally. Basilar widely patent to its distal aspect without stenosis. Superior cerebral arteries patent bilaterally. Both PCAs primarily supplied via the basilar and are well perfused or distal aspects without stenosis. Small bilateral posterior communicating arteries noted. No intracranial aneurysm or other vascular abnormality. IMPRESSION: MRI HEAD IMPRESSION: 1. 6 mm focus of diffusion abnormality involving the left periatrial white matter, consistent with an acute to early subacute small vessel type infarct. No associated hemorrhage or mass effect. 2. Temporal lobe predominant cerebral atrophy with mild to moderate chronic microvascular ischemic disease. MRA HEAD IMPRESSION: Negative intracranial MRA. No large vessel occlusion. No hemodynamically significant or correctable stenosis. Electronically Signed   By:  Jeannine Boga M.D.   On: 01/27/2020 21:54    Assessment: 86 y.o. female resident of an assisted living facility with a PMHx of dementia, DM2, HTN, HLD, osteoporosis, cardiomyopathy and atrial fibrillation (not on any blood thinners), who initially presented to the [facility] on Thursday evening with a chief complaint of dizziness x 1 week in the context of being noted to be hypertensive on Thursday. She had stated that the dizziness was worse when she moved her head. Her son also endorsed some confusion starting about 2 days prior to presenting. Her BP in the ED was initially 200/80. MRI  brain revealed a 6 mm focus of diffusion abnormality involving the left parietal white matter consistent with an acute to early subacute small vessel type infarct. MRA head showed no large vessel occlusion with no hemodynamically significant or correctable stenosis 1. Exam reveals no focal motor or sensory deficit. Poorly oriented to time and place.  2. EKG: Prolonged QT interval 3. TTE from 11/16/2019 showed LVEF 35-40% with LV global hypokinesis.  LV diastolic parameters were consistent with grade 1 diastolic dysfunction. 4. Stroke Risk Factors - Atrial fibrillation, DM2, HTN and HLD  Recommendations: 1. HgbA1c, fasting lipid panel 2. Carotid ultrasound 3. PT consult, OT consult, Speech consult 4. Increase ASA to 325 mg po qd 5. Risk factor modification 6. Telemetry monitoring 7. Frequent neuro checks 8. Diet per ST: Regular;Thin liquid  9. BP management. Out of permissive HTN time window 10. Continue Namenda and Aricept  @Electronically  signed: Dr. Kerney Elbe 01/28/2020, 7:51 PM

## 2020-01-28 NOTE — Progress Notes (Signed)
PROGRESS NOTE    [name]  ZOX:[id number] DOB: 19-Dec-1928 DOA: 01/27/2020 PCP: Tilda Burrow, NP   Chief Complaint  Patient presents with  . Dizziness    Brief Narrative:  As per H&P written by Dr. Josephine Cables on 01/27/20 86 y.o. female with medical history significant for atrial fibrillation not on anticoagulation, dementia, diabetes mellitus type 2, hypertension, hyperlipidemia and osteoporosis who presents to the ED due to 2-3 days of dizziness.  History was obtained from son who was at bedside, per son, dizziness worsens with head movement and she also presented with some confusion.  Patient alerted the nursing facility at the assisted living facility, EMS was activated and patient was taken to the ED for further evaluation and management.  Patient denies chest pain, shortness of breath, headache, nausea, vomiting or abdominal pain.    ED Course:  In the emergency department, BP was initially elevated at 200/80, but other vital signs are within normal range.  Work-up in the ED showed microcytic anemia.  Urinalysis was positive for nitrites, leukocytes-large and WBC> 50, however patient denies any burning sensation on urination, urinary frequency or urgency or any other irritative bladder symptoms.  Urine drug screen was negative.  MRI brain showed 6 mm focus of diffusion abnormality involving the left parietal white matter consistent with an acute to wholly subacute small vessel type infarct.  MRA head showed negative intracranial MRA and no large vessel occlusion with no hemodynamically significant or correctable stenosis.  She was treated with IV ciprofloxacin.  Neurologist at [facility] was consulted and accepted patient for transfer per ED physician.  Hospitalist was asked to admit patient for further evaluation and management.  Assessment & Plan: 1-acute/subacute nonhemorrhagic left parietal CVA -Complete a stroke work-up -Full dose aspirin for secondary prevention -Patient  symptoms have been present for over a week; worse on the day of admission.  She had not had any trouble swallowing. -Continue to allow permissive hypertension; continue statins and further risk factor modification -Patient is out of window for TPA; will still benefit of neurology service consultation to further determine decision and/plan for antiplatelet therapy. -Patient with underlying history of paroxysmal atrial fibrillation; not on anticoagulation secondary to risk for falls. -Continue as needed meclizine. -Follow PT/OT recommendations.  2-HYPERCHOLESTEROLEMIA -Continue statins.  3-Essential hypertension -Blood pressure elevated and initially uncontrolled. -Will resume some of her antihypertensive agents to avoid rebound tachycardia and rebound hypertension; still allowing for permissive hypertension in the setting of acute/subacute ischemic process. -Heart healthy diet has been ordered.  4-PAROXYSMAL ATRIAL FIBRILLATION -Currently rate control and per EKG sinus rhythm. -Continue beta-blocker for rate management -Not on anticoagulation secondary to high fall risk. -CHADsVASC score 5-6  5-Dementia (HCC) -No behavioral disturbances appreciated -Continue the use of Aricept -Constant reorientation recommended. -Continue supportive care.  6-chronic systolic heart failure -Last ejection fraction 35 to 40% -Continue to follow daily weights, strict I's and O's, low-sodium diet and resumption of Xarelto her medications (while still allowing for permissive hypertension). -Patient actively followed by cardiology as an outpatient.  7-pyuria -Culture has been ordered -No dysuria or any symptoms of urinary tract infection currently -X1 dose of Cipro given in the ED; no necessity for continue antibiotic therapy appreciated at this time.   DVT prophylaxis: SCD's Code Status: DNR Family Communication: Son at bedside. Disposition: Patient will be transferred to [facility] under  hospitalist service in order to complete stroke work-up, and to have formal neurology service consultation to further determine plan for antiplatelet  therapy and further recommendations.  Status is: Inpatient  Dispo: The patient is from: [place] (ALF)              Anticipated d/c is to: to be determined.               Anticipated d/c date is: 01/29/20--01/30/20              Patient currently not medically ready for discharge; still pending stroke work up completion and evaluation by neurology service. Patient will also need PT/OT evaluation.    Consultants:   Neurology ([facility]. Cheral [facility]er at [facility]) recommendations given for [facility] admission at [facility] for further evaluation and management by neurology service. A   Procedures:  See below for x-ray reports.   Antimicrobials:  One time dose of ciprofloxacin given in ED.  Subjective: Continue expressing dizziness feeling (slightly better), no chest pain, no nausea, no vomiting, no shortness of breath.  Denies palpitation.  Objective: Vitals:   01/28/20 0012 01/28/20 0030 01/28/20 0103 01/28/20 0802  BP: (!) 197/70 139/78 (!) 168/76 (!) 185/93  Pulse: 76 77 91   Resp:      Temp:      TempSrc:      SpO2: 94% 95% 96%   Weight:      Height:        Intake/Output Summary (Last 24 hours) at 01/28/2020 0838 Last data filed at 01/28/2020 0100 Gross per 24 hour  Intake 200 ml  Output 900 ml  Net -700 ml   Filed Weights   01/27/20 2027  Weight: 68 kg    Examination:  General exam: Appears calm and comfortable, expressing feeling dizzy; no chest pain, no nausea, no shortness of breath. Respiratory system: Clear to auscultation. Respiratory effort normal. Cardiovascular system: S1 & S2 heard, Rate controlled and regular currently. No JVD, murmurs, rubs or gallops.  No edema. Gastrointestinal system: Abdomen is nondistended, soft and nontender. No organomegaly or masses felt. Normal bowel sounds heard. Central nervous system: Alert and  oriented. No focal neurological deficits. Extremities: Muscle strength 4 out of 5 bilaterally and symmetrically in the setting of poor effort; no focal motor deficits or pronation drift appreciated. Skin: No rashes, no petechiae. Psychiatry: Mood & affect appropriate.     Data Reviewed: I have personally reviewed following labs and imaging studies  CBC: Recent Labs  Lab 01/27/20 2055 01/28/20 0413  WBC 8.3 10.5  NEUTROABS 3.8  --   HGB 10.7* 10.2*  HCT 35.3* 33.7*  MCV 65.2* 65.3*  PLT 222 811    Basic Metabolic Panel: Recent Labs  Lab 01/27/20 2055 01/28/20 0413  NA 138 140  K 3.7 3.6  CL 103 104  CO2 25 27  GLUCOSE 127* 128*  BUN 10 9  CREATININE 0.63 0.61  CALCIUM 9.0 9.0  MG  --  1.9  PHOS  --  3.7    GFR: Estimated Creatinine Clearance: 43.1 mL/min (by C-G formula based on SCr of 0.61 mg/dL).  Liver Function Tests: Recent Labs  Lab 01/27/20 2055 01/28/20 0413  AST 22 21  ALT 23 22  ALKPHOS 56 52  BILITOT 0.9 0.9  PROT 6.6 6.1*  ALBUMIN 3.9 3.9    CBG: No results for input(s): GLUCAP in the last 168 hours.   Recent Results (from the past 240 hour(s))  SARS Coronavirus 2 by RT PCR (hospital order, performed in [facility] hospital lab) Nasopharyngeal Nasopharyngeal Swab     Status: None   Collection Time: 01/27/20  10:15 PM   Specimen: Nasopharyngeal Swab  Result Value Ref Range Status   SARS Coronavirus 2 NEGATIVE NEGATIVE Final    Comment: (NOTE) SARS-CoV-2 target nucleic acids are NOT DETECTED.  The SARS-CoV-2 RNA is generally detectable in upper and lower respiratory specimens during the acute phase of infection. The lowest concentration of SARS-CoV-2 viral copies this assay can detect is 250 copies / mL. A negative result does not preclude SARS-CoV-2 infection and should not be used as the sole basis for treatment or other patient management decisions.  A negative result may occur with improper specimen collection / handling, submission  of specimen other than nasopharyngeal swab, presence of viral mutation(s) within the areas targeted by this assay, and inadequate number of viral copies (<250 copies / mL). A negative result must be combined with clinical observations, patient history, and epidemiological information.  Fact Sheet for Patients:   StrictlyIdeas.no  Fact Sheet for Healthcare Providers: BankingDealers.co.za  This test is not yet approved or  cleared by the [place] FDA and has been authorized for detection and/or diagnosis of SARS-CoV-2 by FDA under an Emergency Use Authorization (EUA).  This EUA will remain in effect (meaning this test can be used) for the duration of the COVID-19 declaration under Section 564(b)(1) of the Act, 21 U.S.C. section 360bbb-3(b)(1), unless the authorization is terminated or revoked sooner.  Performed at [facility], [address] [place], [place] [address]      Radiology Studies: MR ANGIO HEAD WO CONTRAST  Result Date: 01/27/2020 CLINICAL DATA:  Initial evaluation for acute dizziness, hypertension. EXAM: MRI HEAD WITHOUT CONTRAST MRA HEAD WITHOUT CONTRAST TECHNIQUE: Multiplanar, multiecho pulse sequences of the brain and surrounding structures were obtained without intravenous contrast. Angiographic images of the head were obtained using MRA technique without contrast. COMPARISON:  Comparison made with prior head CT from 09/05/2016. FINDINGS: MRI HEAD FINDINGS Brain: Temporal lobe predominant cerebral atrophy noted. Patchy T2/FLAIR hyperintensity within the periventricular and deep white matter both cerebral hemispheres most consistent with chronic small vessel ischemic disease, mild to moderate in nature. Mild patchy involvement of the pons noted. Superimposed small remote lacunar infarct noted at the right basal ganglia. 6 mm focus of diffusion abnormality seen involving the left periatrial white matter (series 3, image 29),  consistent with an acute to early subacute small vessel type infarct. No associated hemorrhage or mass effect. No other diffusion abnormality to suggest acute or subacute ischemia. Gray-white matter differentiation otherwise maintained. No encephalomalacia to suggest chronic cortical infarction. No evidence for acute intracranial hemorrhage. Few scattered punctate foci of susceptibility artifact noted involving the supratentorial brain, consistent with small chronic micro hemorrhages, favored to be hypertensive in nature. No mass lesion, midline shift or mass effect. No hydrocephalus or extra-axial fluid collection. Pituitary gland suprasellar region normal. Midline structures intact. Vascular: Major intracranial vascular flow voids are maintained. Skull and upper cervical spine: Craniocervical junction within normal limits. Upper cervical spine normal. Bone marrow signal intensity within normal limits. No scalp soft tissue abnormality. Sinuses/Orbits: Patient status post bilateral ocular lens replacement. Paranasal sinuses are largely clear. Trace right mastoid effusion noted, of doubtful significance. Inner ear structures grossly normal. Other: None. MRA HEAD FINDINGS ANTERIOR CIRCULATION: Visualized distal cervical segments of the internal carotid arteries are patent with symmetric antegrade flow. Petrous, cavernous, and supraclinoid ICAs widely patent without stenosis or other abnormality. ICA termini well perfused. A1 segments patent bilaterally. Normal anterior communicating artery complex. Anterior cerebral arteries widely patent to their distal aspects without stenosis. No  M1 stenosis or occlusion. Normal MCA bifurcations. Distal MCA branches well perfused and symmetric. POSTERIOR CIRCULATION: Vertebral arteries patent to the vertebrobasilar junction without stenosis. Left vertebral artery dominant. Both picas patent proximally. Basilar widely patent to its distal aspect without stenosis. Superior cerebral  arteries patent bilaterally. Both PCAs primarily supplied via the basilar and are well perfused or distal aspects without stenosis. Small bilateral posterior communicating arteries noted. No intracranial aneurysm or other vascular abnormality. IMPRESSION: MRI HEAD IMPRESSION: 1. 6 mm focus of diffusion abnormality involving the left periatrial white matter, consistent with an acute to early subacute small vessel type infarct. No associated hemorrhage or mass effect. 2. Temporal lobe predominant cerebral atrophy with mild to moderate chronic microvascular ischemic disease. MRA HEAD IMPRESSION: Negative intracranial MRA. No large vessel occlusion. No hemodynamically significant or correctable stenosis. Electronically Signed   By: Jeannine Boga M.D.   On: 01/27/2020 21:54   MR Brain Wo Contrast (neuro protocol)  Result Date: 01/27/2020 CLINICAL DATA:  Initial evaluation for acute dizziness, hypertension. EXAM: MRI HEAD WITHOUT CONTRAST MRA HEAD WITHOUT CONTRAST TECHNIQUE: Multiplanar, multiecho pulse sequences of the brain and surrounding structures were obtained without intravenous contrast. Angiographic images of the head were obtained using MRA technique without contrast. COMPARISON:  Comparison made with prior head CT from 09/05/2016. FINDINGS: MRI HEAD FINDINGS Brain: Temporal lobe predominant cerebral atrophy noted. Patchy T2/FLAIR hyperintensity within the periventricular and deep white matter both cerebral hemispheres most consistent with chronic small vessel ischemic disease, mild to moderate in nature. Mild patchy involvement of the pons noted. Superimposed small remote lacunar infarct noted at the right basal ganglia. 6 mm focus of diffusion abnormality seen involving the left periatrial white matter (series 3, image 29), consistent with an acute to early subacute small vessel type infarct. No associated hemorrhage or mass effect. No other diffusion abnormality to suggest acute or subacute  ischemia. Gray-white matter differentiation otherwise maintained. No encephalomalacia to suggest chronic cortical infarction. No evidence for acute intracranial hemorrhage. Few scattered punctate foci of susceptibility artifact noted involving the supratentorial brain, consistent with small chronic micro hemorrhages, favored to be hypertensive in nature. No mass lesion, midline shift or mass effect. No hydrocephalus or extra-axial fluid collection. Pituitary gland suprasellar region normal. Midline structures intact. Vascular: Major intracranial vascular flow voids are maintained. Skull and upper cervical spine: Craniocervical junction within normal limits. Upper cervical spine normal. Bone marrow signal intensity within normal limits. No scalp soft tissue abnormality. Sinuses/Orbits: Patient status post bilateral ocular lens replacement. Paranasal sinuses are largely clear. Trace right mastoid effusion noted, of doubtful significance. Inner ear structures grossly normal. Other: None. MRA HEAD FINDINGS ANTERIOR CIRCULATION: Visualized distal cervical segments of the internal carotid arteries are patent with symmetric antegrade flow. Petrous, cavernous, and supraclinoid ICAs widely patent without stenosis or other abnormality. ICA termini well perfused. A1 segments patent bilaterally. Normal anterior communicating artery complex. Anterior cerebral arteries widely patent to their distal aspects without stenosis. No M1 stenosis or occlusion. Normal MCA bifurcations. Distal MCA branches well perfused and symmetric. POSTERIOR CIRCULATION: Vertebral arteries patent to the vertebrobasilar junction without stenosis. Left vertebral artery dominant. Both picas patent proximally. Basilar widely patent to its distal aspect without stenosis. Superior cerebral arteries patent bilaterally. Both PCAs primarily supplied via the basilar and are well perfused or distal aspects without stenosis. Small bilateral posterior communicating  arteries noted. No intracranial aneurysm or other vascular abnormality. IMPRESSION: MRI HEAD IMPRESSION: 1. 6 mm focus of diffusion abnormality involving the left periatrial white matter,  consistent with an acute to early subacute small vessel type infarct. No associated hemorrhage or mass effect. 2. Temporal lobe predominant cerebral atrophy with mild to moderate chronic microvascular ischemic disease. MRA HEAD IMPRESSION: Negative intracranial MRA. No large vessel occlusion. No hemodynamically significant or correctable stenosis. Electronically Signed   By: Jeannine Boga M.D.   On: 01/27/2020 21:54    Scheduled Meds: . aspirin EC  325 mg Oral Daily  . carvedilol  6.25 mg Oral BID WC  . cycloSPORINE  1 drop Both Eyes BID  . donepezil  5 mg Oral QHS  . isosorbide mononitrate  15 mg Oral QPM  . memantine  10 mg Oral BID  . nortriptyline  25 mg Oral QHS  . simvastatin  40 mg Oral QPM  . tamsulosin  0.4 mg Oral QPC supper   Continuous Infusions:   LOS: 1 day    Time spent: 30 minutes.    Barton Dubois, MD [facility]   To contact the attending provider between 7A-7P or the covering provider during after hours 7P-7A, please log into the web site [email or web address] and access using universal [place] password for that web site. If you do not have the password, please call the hospital operator.  01/28/2020, 8:38 AM

## 2020-01-28 NOTE — Evaluation (Signed)
Clinical/Bedside Swallow Evaluation Patient Details  Name: [name] MRN: [id number] Date of Birth: [birth year]  Today's Date: 01/28/2020 Time: SLP Start Time (ACUTE ONLY): 1057 SLP Stop Time (ACUTE ONLY): 1119 SLP Time Calculation (min) (ACUTE ONLY): 22 min  Past Medical History:  Past Medical History:  Diagnosis Date   Atrial fibrillation ([place])    Cardiomyopathy ([place])    a. 10/2019: EF 35-40% by echo --> medical management recommended.    Dementia ([place])    Diverticulitis 8/09, 3/10   Abscess in 2009 (medical tx)   Essential hypertension    Hyperlipidemia    Hyperthyroidism    Radiation, surgery   IDA (iron deficiency anemia)    Osteoporosis    Postoperative anemia due to acute blood loss 09/08/2016   Thalassemia    Tubular adenoma of colon 12/15/08   Colonosocpy Dr Tonita Cong removed cecum   Type 2 diabetes mellitus ([place])    Past Surgical History:  Past Surgical History:  Procedure Laterality Date   CESAREAN SECTION     x2   CHOLECYSTECTOMY     FOOT SURGERY     bilat   INTRAMEDULLARY (IM) NAIL INTERTROCHANTERIC Left 09/06/2016   Procedure: LEFT HIP INTRAMEDULLARY (IM) NAIL;  Surgeon: Renette Butters, MD;  Location: [facility];  Service: Orthopedics;  Laterality: Left;   THYROIDECTOMY, PARTIAL     HPI:  [name] is a 86 y.o. female with medical history significant for atrial fibrillation not on anticoagulation, dementia, diabetes mellitus type 2, hypertension, hyperlipidemia and osteoporosis who presents to the ED due to 2-3 days of dizziness.  History was obtained from son who was at bedside, per son, dizziness worsens with head movement and she also presented with some confusion.  Patient alerted the nursing facility at the assisted living facility, EMS was activated and patient was taken to the ED for further evaluation and management.  Patient denies chest pain, shortness of breath, headache, nausea, vomiting or abdominal pain. MRI brain showed 6  mm focus of diffusion abnormality involving the left parietal white matter consistent with an acute to wholly subacute small vessel type infarct.    Assessment / Plan / Recommendation Clinical Impression  Clinical swallowing evaluation competed while Pt was sitting upright in bed; Pt consumed thin liquids, regular solids and jell-o without overt s/sx of oropharyngeal dysphagia with the exception of one episode of delayed throat clearing and delayed coughing when she attempted to talk while drinking thin liquids via the straw (this was seemingly related to awareness of bolus related to attention). SLP provided education of the importance of not talking during PO, utilizing a slow consumption rate and being seated upright for all PO. Recommend initiate regular diet with thin liquids with NO STRAWS. Recommend meds to be administered whole with liquids however recommend larger pills be administered whole with puree or pudding. There are no further ST needs noted at this time, ST to sign off. Thank you         Diet Recommendation Regular;Thin liquid   Liquid Administration via: Cup;No straw Medication Administration: Whole meds with liquid (larger pills whole with puree or pudding) Supervision: Patient able to self feed;Intermittent supervision to cue for compensatory strategies Compensations: Minimize environmental distractions;Slow rate;Small sips/bites Postural Changes: Seated upright at 90 degrees;Remain upright for at least 30 minutes after po intake    Other  Recommendations Oral Care Recommendations: Oral care BID    Prognosis        Swallow Study   General Date of  Onset: 01/27/20 HPI: [name] is a 86 y.o. female with medical history significant for atrial fibrillation not on anticoagulation, dementia, diabetes mellitus type 2, hypertension, hyperlipidemia and osteoporosis who presents to the ED due to 2-3 days of dizziness.  History was obtained from son who was at bedside, per  son, dizziness worsens with head movement and she also presented with some confusion.  Patient alerted the nursing facility at the assisted living facility, EMS was activated and patient was taken to the ED for further evaluation and management.  Patient denies chest pain, shortness of breath, headache, nausea, vomiting or abdominal pain. MRI brain showed 6 mm focus of diffusion abnormality involving the left parietal white matter consistent with an acute to wholly subacute small vessel type infarct.  Type of Study: Bedside Swallow Evaluation Previous Swallow Assessment: none in chart Diet Prior to this Study: NPO Temperature Spikes Noted: No Respiratory Status: Room air History of Recent Intubation: No Behavior/Cognition: Alert;Cooperative;Pleasant mood Oral Cavity Assessment: Within Functional Limits Oral Care Completed by SLP: Recent completion by staff Oral Cavity - Dentition: Adequate natural dentition Vision: Functional for self-feeding Self-Feeding Abilities: Able to feed self Patient Positioning: Upright in bed Baseline Vocal Quality: Normal Volitional Cough: Strong Volitional Swallow: Able to elicit    Oral/Motor/Sensory Function Overall Oral Motor/Sensory Function: Within functional limits   Ice Chips Ice chips: Within functional limits   Thin Liquid Thin Liquid: Impaired Presentation: Straw Pharyngeal  Phase Impairments: Throat Clearing - Delayed;Cough - Delayed    Nectar Thick Nectar Thick Liquid: Not tested   Honey Thick Honey Thick Liquid: Not tested   Puree Puree: Not tested   Solid     Solid: Within functional limits     [name] [name], [place] Speech Language Pathologist  [name] 01/28/2020,11:20 AM

## 2020-01-28 NOTE — Plan of Care (Signed)
°  Problem: Acute Rehab PT Goals(only PT should resolve) Goal: Pt Will Go Supine/Side To Sit Outcome: Progressing Flowsheets (Taken 01/28/2020 1012) Pt will go Supine/Side to Sit: with supervision Goal: Patient Will Transfer Sit To/From Stand Outcome: Progressing Flowsheets (Taken 01/28/2020 1012) Patient will transfer sit to/from stand:  with supervision  with min guard assist Goal: Pt Will Transfer Bed To Chair/Chair To Bed Outcome: Progressing Flowsheets (Taken 01/28/2020 1012) Pt will Transfer Bed to Chair/Chair to Bed:  with supervision  min guard assist Goal: Pt Will Ambulate Outcome: Progressing Flowsheets (Taken 01/28/2020 1012) Pt will Ambulate:  75 feet  with supervision  with min guard assist  with rolling walker   10:13 AM, 01/28/20 Lonell Grandchild, MPT Physical Therapist with [facility] 336 [phone] office [phone] mobile phone

## 2020-01-28 NOTE — Progress Notes (Addendum)
Pt was not resting when V/S were taken

## 2020-01-28 NOTE — Progress Notes (Signed)
Received the pt from [facility], alert and oriented, implemented MD orders, had yellow mews when she came, retook the V/S and were green, will continue to monitor

## 2020-01-28 NOTE — Evaluation (Signed)
Physical Therapy Evaluation Patient Details Name: [name] MRN: [id number] DOB: 1929-05-20 Today's Date: 01/28/2020   History of Present Illness  [name] is a 86 y.o. female with medical history significant for atrial fibrillation not on anticoagulation, dementia, diabetes mellitus type 2, hypertension, hyperlipidemia and osteoporosis who presents to the ED due to 2-3 days of dizziness.  History was obtained from son who was at bedside, per son, dizziness worsens with head movement and she also presented with some confusion.  Patient alerted the nursing facility at the assisted living facility, EMS was activated and patient was taken to the ED for further evaluation and management.  Patient denies chest pain, shortness of breath, headache, nausea, vomiting or abdominal pain.      Clinical Impression  Patient has difficulty propping up on elbows during supine to sitting due to generalized weakness, able to ambulate in hallway without loss of balance, but slow labored cadence and limited mostly due to c/o dizziness that worsens when she looks up/down.  Patient requires verbal cues of safety to stay close to RW during transfers to chair and commode, tends to push RW away while reaching for commode or chair.  Patient tolerated sitting up in chair after therapy with her son present in room after therapy - RN notified.  Patient will benefit from continued physical therapy in hospital and recommended venue below to increase strength, balance, endurance for safe ADLs and gait.     Follow Up Recommendations Home health PT;Supervision - Intermittent;Supervision for mobility/OOB    Equipment Recommendations  Hospital bed    Recommendations for Other Services       Precautions / Restrictions Precautions Precautions: Fall Restrictions Weight Bearing Restrictions: No      Mobility  Bed Mobility Overal bed mobility: Needs Assistance Bed Mobility: Supine to Sit     Supine to sit:  Min assist     General bed mobility comments: has difficulty propping up on elbows due to weakness  Transfers Overall transfer level: Needs assistance Equipment used: Rolling walker (2 wheeled) Transfers: Sit to/from [facility] Sit to Stand: Min guard;Min assist Stand pivot transfers: Min guard;Min assist       General transfer comment: requires verbal cues to stay closer to RW during transfers to chair and commode in bathroom  Ambulation/Gait Ambulation/Gait assistance: Min guard;Min assist Gait Distance (Feet): 45 Feet Assistive device: Rolling walker (2 wheeled) Gait Pattern/deviations: Decreased step length - right;Decreased step length - left;Decreased stride length Gait velocity: decreased   General Gait Details: slow slightly labored cadence without loss of balance, limited mostly due to c/o diizziness  Stairs            Wheelchair Mobility    Modified Rankin (Stroke Patients Only)       Balance Overall balance assessment: Needs assistance Sitting-balance support: No upper extremity supported;Feet supported Sitting balance-Leahy Scale: Fair Sitting balance - Comments: fair/good seated at EOB   Standing balance support: Bilateral upper extremity supported;During functional activity Standing balance-Leahy Scale: Fair Standing balance comment: using RW                             Pertinent Vitals/Pain Pain Assessment: No/denies pain    Home Living Family/patient expects to be discharged to:: Assisted living               Home Equipment: Walker - 2 wheels;Wheelchair - manual      Prior Function Level of  Independence: Needs assistance   Gait / Transfers Assistance Needed: Household ambulator using RW  ADL's / Homemaking Assistance Needed: assisted by ALF staff        Hand Dominance   Dominant Hand: Right    Extremity/Trunk Assessment   Upper Extremity Assessment Upper Extremity Assessment: Defer to OT  evaluation    Lower Extremity Assessment Lower Extremity Assessment: Generalized weakness    Cervical / Trunk Assessment Cervical / Trunk Assessment:  (no significant difference in stregnth BLE)  Communication   Communication: No difficulties  Cognition Arousal/Alertness: Awake/alert Behavior During Therapy: WFL for tasks assessed/performed Overall Cognitive Status: Within Functional Limits for tasks assessed                                        General Comments      Exercises     Assessment/Plan    PT Assessment Patient needs continued PT services  PT Problem List Decreased strength;Decreased activity tolerance;Decreased balance;Decreased mobility       PT Treatment Interventions Gait training;Stair training;Functional mobility training;Therapeutic activities;Therapeutic exercise;Balance training;Neuromuscular re-education;Patient/family education    PT Goals (Current goals can be found in the Care Plan section)  Acute Rehab PT Goals Patient Stated Goal: return to ALF with ALF staff/family to assist PT Goal Formulation: With patient/family Time For Goal Achievement: 02/04/20 Potential to Achieve Goals: Good    Frequency Min 3X/week   Barriers to discharge        Co-evaluation               AM-PAC PT "6 Clicks" Mobility  Outcome Measure Help needed turning from your back to your side while in a flat bed without using bedrails?: None Help needed moving from lying on your back to sitting on the side of a flat bed without using bedrails?: A Little Help needed moving to and from a bed to a chair (including a wheelchair)?: A Little Help needed standing up from a chair using your arms (e.g., wheelchair or bedside chair)?: A Little Help needed to walk in hospital room?: A Little Help needed climbing 3-5 steps with a railing? : A Lot 6 Click Score: 18    End of Session   Activity Tolerance: Patient tolerated treatment well;Patient limited by  fatigue Patient left: in chair;with call bell/phone within reach;with family/visitor present Nurse Communication: Mobility status PT Visit Diagnosis: Unsteadiness on feet (R26.81);Other abnormalities of gait and mobility (R26.89);Muscle weakness (generalized) (M62.81)    Time: 6153-7943 PT Time Calculation (min) (ACUTE ONLY): 38 min   Charges:   PT Evaluation $PT Eval Moderate Complexity: 1 Mod PT Treatments $Therapeutic Activity: 38-52 mins        10:10 AM, 01/28/20 Lonell Grandchild, MPT Physical Therapist with [facility] 336 [phone] office [phone] mobile phone

## 2020-01-29 ENCOUNTER — Inpatient Hospital Stay (HOSPITAL_COMMUNITY): Payer: Medicare HMO

## 2020-01-29 DIAGNOSIS — N390 Urinary tract infection, site not specified: Secondary | ICD-10-CM | POA: Diagnosis not present

## 2020-01-29 DIAGNOSIS — I639 Cerebral infarction, unspecified: Secondary | ICD-10-CM

## 2020-01-29 DIAGNOSIS — I4891 Unspecified atrial fibrillation: Secondary | ICD-10-CM | POA: Diagnosis not present

## 2020-01-29 LAB — URINE CULTURE

## 2020-01-29 LAB — MRSA PCR SCREENING: MRSA by PCR: NEGATIVE

## 2020-01-29 MED ORDER — FOSFOMYCIN TROMETHAMINE 3 G PO PACK
3.0000 g | PACK | Freq: Once | ORAL | Status: AC
  Administered 2020-01-29: 3 g via ORAL
  Filled 2020-01-29: qty 3

## 2020-01-29 MED ORDER — CIPROFLOXACIN HCL 500 MG PO TABS: 500.0000 mg | ORAL_TABLET | Freq: Two times a day (BID) | ORAL | Status: DC

## 2020-01-29 NOTE — Progress Notes (Addendum)
PROGRESS NOTE    [name]  KWI:[id number] DOB: [birth year] DOA: 01/27/2020 PCP: Tilda Burrow, NP   Chief Complaint  Patient presents with  . Dizziness    Brief Narrative:  As per H&P written by Dr. Josephine Cables on 01/27/20 86 y.o. female with medical history significant for atrial fibrillation not on anticoagulation, dementia, diabetes mellitus type 2, hypertension, hyperlipidemia and osteoporosis who presents to the ED due to 2-3 days of dizziness.  History was obtained from son who was at bedside, per son, dizziness worsens with head movement and she also presented with some confusion.  Patient alerted the nursing facility at the assisted living facility, EMS was activated and patient was taken to the ED for further evaluation and management.  Patient denies chest pain, shortness of breath, headache, nausea, vomiting or abdominal pain.    ED Course:  In the emergency department, BP was initially elevated at 200/80, but other vital signs are within normal range.  Work-up in the ED showed microcytic anemia.  Urinalysis was positive for nitrites, leukocytes-large and WBC> 50, however patient denies any burning sensation on urination, urinary frequency or urgency or any other irritative bladder symptoms.  Urine drug screen was negative.  MRI brain showed 6 mm focus of diffusion abnormality involving the left parietal white matter consistent with an acute to wholly subacute small vessel type infarct.  MRA head showed negative intracranial MRA and no large vessel occlusion with no hemodynamically significant or correctable stenosis.  She was treated with IV ciprofloxacin.  Neurologist at [facility] was consulted and accepted patient for transfer per ED physician.  Hospitalist was asked to admit patient for further evaluation and management.  Subjective: Reports she is feeling better, no chest pain, no nausea, no vomiting .   Assessment & Plan:  Acute CVA -With resultant confusion and  dizziness, - MRI head - 6 mm focus of diffusion abnormality involving the left periatrial white matter, consistent with an acute to early subacute small vessel type infarct. No associated hemorrhage or mass effect. Temporal lobe predominant cerebral atrophy with mild to moderate chronic microvascular ischemic disease. - MRA head - Negative intracranial MRA. - Carotid Doppler - pending - 2D Echo - 11/16/2019 - EF 35 - 40%. No cardiac source of emboli identified -Neurology input greatly appreciated, will complete her work-up including carotid Doppler to evaluate for large vessel disease, she does have atrial fibrillation, but she is not a good candidate for long-term anticoagulation given her significant dementia, dizziness and falls risk, so recommendation to increase aspirin from 81 mg to 325 mg oral daily. -PT/OT consulted  2-HYPERCHOLESTEROLEMIA -Continue statins.  3-Essential hypertension -Blood pressure elevated and initially uncontrolled. -Will resume some of her antihypertensive agents to avoid rebound tachycardia and rebound hypertension; still allowing for permissive hypertension in the setting of acute/subacute ischemic process. -Heart healthy diet has been ordered.  4-PAROXYSMAL ATRIAL FIBRILLATION -Currently rate control and per EKG sinus rhythm. -Continue beta-blocker for rate management -Not on anticoagulation secondary to high fall risk. -CHADsVASC score 5-6  5-Dementia (HCC) -No behavioral disturbances appreciated -Continue the use of Aricept -Constant reorientation recommended. -Continue supportive care.  6-chronic systolic heart failure -Last ejection fraction 35 to 40% -Continue to follow daily weights, strict I's and O's, low-sodium diet and resumption of Xarelto her medications (while still allowing for permissive hypertension). -Patient actively followed by cardiology as an outpatient.   UTI -Will treat with fosfomycin, given multiple allergies, and prolonged QTC  which prohibit use of quinolones.  DVT  prophylaxis: SCD's Code Status: DNR Family Communication: Son at bedside. Disposition: Will go to [place] tomorrow  Status is: Inpatient  Dispo: The patient is from: [place] (ALF)              Anticipated d/c is to: to be determined.               Anticipated d/c date is: 01/29/20--01/30/20              Patient currently not medically ready for discharge; still awaiting to finish her stroke work-up including carotid Doppler to evaluate for large vessel disease.    Consultants:   Neurology (Dr. Cheral Marker at [facility]) recommendations given for [facility] admission at [facility] for further evaluation and management by neurology service. A   Procedures:  See below for x-ray reports.   Antimicrobials:  One time dose of ciprofloxacin given in ED.    Objective: Vitals:   01/29/20 0351 01/29/20 0815 01/29/20 1131 01/29/20 1551  BP: 130/90 (!) 162/73 (!) 132/54 (!) 149/64  Pulse: 97 71 (!) 54 65  Resp: 18 20 16 18   Temp: 98 F (36.7 C) 98.2 F (36.8 C) 98.1 F (36.7 C) 98.2 F (36.8 C)  TempSrc: Oral Oral Oral Axillary  SpO2: 95% 96% 98% 96%  Weight:      Height:        Intake/Output Summary (Last 24 hours) at 01/29/2020 1620 Last data filed at 01/29/2020 1000 Gross per 24 hour  Intake 120 ml  Output 400 ml  Net -280 ml   Filed Weights   01/27/20 2027  Weight: 68 kg    Examination:  Awake Alert, pleasent, No new F.N deficits, Normal affect Symmetrical Chest wall movement, Good air movement bilaterally, CTAB RRR,No Gallops,Rubs or new Murmurs, No Parasternal Heave +ve B.Sounds, Abd Soft, No tenderness, No rebound - guarding or rigidity. No Cyanosis, Clubbing or edema, No new Rash or bruise      Data Reviewed: I have personally reviewed following labs and imaging studies  CBC: Recent Labs  Lab 01/27/20 2055 01/28/20 0413  WBC 8.3 10.5  NEUTROABS 3.8  --   HGB 10.7* 10.2*  HCT 35.3* 33.7*  MCV 65.2* 65.3*  PLT 222 231     Basic Metabolic Panel: Recent Labs  Lab 01/27/20 2055 01/28/20 0413  NA 138 140  K 3.7 3.6  CL 103 104  CO2 25 27  GLUCOSE 127* 128*  BUN 10 9  CREATININE 0.63 0.61  CALCIUM 9.0 9.0  MG  --  1.9  PHOS  --  3.7    GFR: Estimated Creatinine Clearance: 43.1 mL/min (by C-G formula based on SCr of 0.61 mg/dL).  Liver Function Tests: Recent Labs  Lab 01/27/20 2055 01/28/20 0413  AST 22 21  ALT 23 22  ALKPHOS 56 52  BILITOT 0.9 0.9  PROT 6.6 6.1*  ALBUMIN 3.9 3.9    CBG: No results for input(s): GLUCAP in the last 168 hours.   Recent Results (from the past 240 hour(s))  SARS Coronavirus 2 by RT PCR (hospital order, performed in [facility] hospital lab) Nasopharyngeal Nasopharyngeal Swab     Status: None   Collection Time: 01/27/20 10:15 PM   Specimen: Nasopharyngeal Swab  Result Value Ref Range Status   SARS Coronavirus 2 NEGATIVE NEGATIVE Final    Comment: (NOTE) SARS-CoV-2 target nucleic acids are NOT DETECTED.  The SARS-CoV-2 RNA is generally detectable in upper and lower respiratory specimens during the acute phase of infection. The lowest  concentration of SARS-CoV-2 viral copies this assay can detect is 250 copies / mL. A negative result does not preclude SARS-CoV-2 infection and should not be used as the sole basis for treatment or other patient management decisions.  A negative result may occur with improper specimen collection / handling, submission of specimen other than nasopharyngeal swab, presence of viral mutation(s) within the areas targeted by this assay, and inadequate number of viral copies (<250 copies / mL). A negative result must be combined with clinical observations, patient history, and epidemiological information.  Fact Sheet for Patients:   StrictlyIdeas.no  Fact Sheet for Healthcare Providers: BankingDealers.co.za  This test is not yet approved or  cleared by the [place] FDA  and has been authorized for detection and/or diagnosis of SARS-CoV-2 by FDA under an Emergency Use Authorization (EUA).  This EUA will remain in effect (meaning this test can be used) for the duration of the COVID-19 declaration under Section 564(b)(1) of the Act, 21 U.S.C. section 360bbb-3(b)(1), unless the authorization is terminated or revoked sooner.  Performed at [facility], [address], [place], [place] [address]   Urine Culture     Status: Abnormal   Collection Time: 01/27/20 10:17 PM   Specimen: Urine, Clean Catch  Result Value Ref Range Status   Specimen Description   Final    URINE, CLEAN CATCH Performed at [facility], [address], [place], [place] [address]    Special Requests   Final    NONE Performed at [facility], [address], [place], [place] [address]    Culture MULTIPLE SPECIES PRESENT, SUGGEST RECOLLECTION (A)  Final   Report Status 01/29/2020 FINAL  Final  MRSA PCR Screening     Status: None   Collection Time: 01/29/20 12:13 AM   Specimen: Nasal Mucosa; Nasopharyngeal  Result Value Ref Range Status   MRSA by PCR NEGATIVE NEGATIVE Final    Comment:        The GeneXpert MRSA Assay (FDA approved for NASAL specimens only), is one component of a comprehensive MRSA colonization surveillance program. It is not intended to diagnose MRSA infection nor to guide or monitor treatment for MRSA infections. Performed at [facility], [place] [address] [place], [place] [address]      Radiology Studies: MR ANGIO HEAD WO CONTRAST  Result Date: 01/27/2020 CLINICAL DATA:  Initial evaluation for acute dizziness, hypertension. EXAM: MRI HEAD WITHOUT CONTRAST MRA HEAD WITHOUT CONTRAST TECHNIQUE: Multiplanar, multiecho pulse sequences of the brain and surrounding structures were obtained without intravenous contrast. Angiographic images of the head were obtained using MRA technique without contrast. COMPARISON:  Comparison made with prior head CT from  09/05/2016. FINDINGS: MRI HEAD FINDINGS Brain: Temporal lobe predominant cerebral atrophy noted. Patchy T2/FLAIR hyperintensity within the periventricular and deep white matter both cerebral hemispheres most consistent with chronic small vessel ischemic disease, mild to moderate in nature. Mild patchy involvement of the pons noted. Superimposed small remote lacunar infarct noted at the right basal ganglia. 6 mm focus of diffusion abnormality seen involving the left periatrial white matter (series 3, image 29), consistent with an acute to early subacute small vessel type infarct. No associated hemorrhage or mass effect. No other diffusion abnormality to suggest acute or subacute ischemia. Gray-white matter differentiation otherwise maintained. No encephalomalacia to suggest chronic cortical infarction. No evidence for acute intracranial hemorrhage. Few scattered punctate foci of susceptibility artifact noted involving the supratentorial brain, consistent with small chronic micro hemorrhages, favored to be hypertensive in nature. No  mass lesion, midline shift or mass effect. No hydrocephalus or extra-axial fluid collection. Pituitary gland suprasellar region normal. Midline structures intact. Vascular: Major intracranial vascular flow voids are maintained. Skull and upper cervical spine: Craniocervical junction within normal limits. Upper cervical spine normal. Bone marrow signal intensity within normal limits. No scalp soft tissue abnormality. Sinuses/Orbits: Patient status post bilateral ocular lens replacement. Paranasal sinuses are largely clear. Trace right mastoid effusion noted, of doubtful significance. Inner ear structures grossly normal. Other: None. MRA HEAD FINDINGS ANTERIOR CIRCULATION: Visualized distal cervical segments of the internal carotid arteries are patent with symmetric antegrade flow. Petrous, cavernous, and supraclinoid ICAs widely patent without stenosis or other abnormality. ICA termini well  perfused. A1 segments patent bilaterally. Normal anterior communicating artery complex. Anterior cerebral arteries widely patent to their distal aspects without stenosis. No M1 stenosis or occlusion. Normal MCA bifurcations. Distal MCA branches well perfused and symmetric. POSTERIOR CIRCULATION: Vertebral arteries patent to the vertebrobasilar junction without stenosis. Left vertebral artery dominant. Both picas patent proximally. Basilar widely patent to its distal aspect without stenosis. Superior cerebral arteries patent bilaterally. Both PCAs primarily supplied via the basilar and are well perfused or distal aspects without stenosis. Small bilateral posterior communicating arteries noted. No intracranial aneurysm or other vascular abnormality. IMPRESSION: MRI HEAD IMPRESSION: 1. 6 mm focus of diffusion abnormality involving the left periatrial white matter, consistent with an acute to early subacute small vessel type infarct. No associated hemorrhage or mass effect. 2. Temporal lobe predominant cerebral atrophy with mild to moderate chronic microvascular ischemic disease. MRA HEAD IMPRESSION: Negative intracranial MRA. No large vessel occlusion. No hemodynamically significant or correctable stenosis. Electronically Signed   By: Jeannine Boga M.D.   On: 01/27/2020 21:54   MR Brain Wo Contrast (neuro protocol)  Result Date: 01/27/2020 CLINICAL DATA:  Initial evaluation for acute dizziness, hypertension. EXAM: MRI HEAD WITHOUT CONTRAST MRA HEAD WITHOUT CONTRAST TECHNIQUE: Multiplanar, multiecho pulse sequences of the brain and surrounding structures were obtained without intravenous contrast. Angiographic images of the head were obtained using MRA technique without contrast. COMPARISON:  Comparison made with prior head CT from 09/05/2016. FINDINGS: MRI HEAD FINDINGS Brain: Temporal lobe predominant cerebral atrophy noted. Patchy T2/FLAIR hyperintensity within the periventricular and deep white matter  both cerebral hemispheres most consistent with chronic small vessel ischemic disease, mild to moderate in nature. Mild patchy involvement of the pons noted. Superimposed small remote lacunar infarct noted at the right basal ganglia. 6 mm focus of diffusion abnormality seen involving the left periatrial white matter (series 3, image 29), consistent with an acute to early subacute small vessel type infarct. No associated hemorrhage or mass effect. No other diffusion abnormality to suggest acute or subacute ischemia. Gray-white matter differentiation otherwise maintained. No encephalomalacia to suggest chronic cortical infarction. No evidence for acute intracranial hemorrhage. Few scattered punctate foci of susceptibility artifact noted involving the supratentorial brain, consistent with small chronic micro hemorrhages, favored to be hypertensive in nature. No mass lesion, midline shift or mass effect. No hydrocephalus or extra-axial fluid collection. Pituitary gland suprasellar region normal. Midline structures intact. Vascular: Major intracranial vascular flow voids are maintained. Skull and upper cervical spine: Craniocervical junction within normal limits. Upper cervical spine normal. Bone marrow signal intensity within normal limits. No scalp soft tissue abnormality. Sinuses/Orbits: Patient status post bilateral ocular lens replacement. Paranasal sinuses are largely clear. Trace right mastoid effusion noted, of doubtful significance. Inner ear structures grossly normal. Other: None. MRA HEAD FINDINGS ANTERIOR CIRCULATION: Visualized distal cervical segments of the  internal carotid arteries are patent with symmetric antegrade flow. Petrous, cavernous, and supraclinoid ICAs widely patent without stenosis or other abnormality. ICA termini well perfused. A1 segments patent bilaterally. Normal anterior communicating artery complex. Anterior cerebral arteries widely patent to their distal aspects without stenosis. No M1  stenosis or occlusion. Normal MCA bifurcations. Distal MCA branches well perfused and symmetric. POSTERIOR CIRCULATION: Vertebral arteries patent to the vertebrobasilar junction without stenosis. Left vertebral artery dominant. Both picas patent proximally. Basilar widely patent to its distal aspect without stenosis. Superior cerebral arteries patent bilaterally. Both PCAs primarily supplied via the basilar and are well perfused or distal aspects without stenosis. Small bilateral posterior communicating arteries noted. No intracranial aneurysm or other vascular abnormality. IMPRESSION: MRI HEAD IMPRESSION: 1. 6 mm focus of diffusion abnormality involving the left periatrial white matter, consistent with an acute to early subacute small vessel type infarct. No associated hemorrhage or mass effect. 2. Temporal lobe predominant cerebral atrophy with mild to moderate chronic microvascular ischemic disease. MRA HEAD IMPRESSION: Negative intracranial MRA. No large vessel occlusion. No hemodynamically significant or correctable stenosis. Electronically Signed   By: Jeannine Boga M.D.   On: 01/27/2020 21:54    Scheduled Meds: . aspirin EC  325 mg Oral Daily  . carvedilol  6.25 mg Oral BID WC  . ciprofloxacin  500 mg Oral BID  . cycloSPORINE  1 drop Both Eyes BID  . donepezil  5 mg Oral QHS  . heparin injection (subcutaneous)  5,000 Units Subcutaneous Q8H  . isosorbide mononitrate  15 mg Oral QPM  . memantine  10 mg Oral BID  . nortriptyline  25 mg Oral QHS  . simvastatin  40 mg Oral QPM  . tamsulosin  0.4 mg Oral QPC supper   Continuous Infusions:   LOS: 2 days     Phillips Climes, MD [facility]   To contact the attending provider between 7A-7P or the covering provider during after hours 7P-7A, please log into the web site [email or web address] and access using universal [place] password for that web site. If you do not have the password, please call the hospital operator.  01/29/2020,  4:20 PM

## 2020-01-29 NOTE — Evaluation (Signed)
Occupational Therapy Evaluation Patient Details Name: [name] MRN: [id number] DOB: April 07, 1929 Today's Date: 01/29/2020    History of Present Illness [name] is a 86 y.o. female with medical history significant for atrial fibrillation not on anticoagulation, dementia, diabetes mellitus type 2, hypertension, hyperlipidemia and osteoporosis who presents to the ED due to 2-3 days of dizziness.  History was obtained from son who was at bedside, per son, dizziness worsens with head movement and she also presented with some confusion.  Patient alerted the nursing facility at the assisted living facility, EMS was activated and patient was taken to the ED for further evaluation and management.  Patient denies chest pain, shortness of breath, headache, nausea, vomiting or abdominal pain.     Clinical Impression   This 86 y/o female presents with the above. PTA pt residing in ALF, reports typically mod independent with basic ADL and mobility using rollator, reports ALF staff assist with iADL. Pt currently with limitations including intermittent dizziness, weakness, decreased balance. Pt mostly endorsing dizziness with supine<>sit transitions, no nystagmus noted and pt reports improves given time to rest between transitions, reports feeling like "the room is spinning" when having an episode of dizziness. Pt with +orthostatic BP however pt denying any dizziness at time of taking vitals when sitting/standing. Pt overall requiring minA for functional transfers using RW and for ADL tasks. Am hopeful pt to progress well once dizziness improves. Pt to benefit from continued acute OT services, recommend follow up [place] services at current ALF pending they can provide supervision for ADL/mobility tasks to ensure safety and to reduce risk for falls. Will follow.    BP sitting: 171/86 Standing: 133/77 (denies dizziness) Standing x2 min: 129/86 (denies dizziness)    Follow Up Recommendations  Home  health OT;Supervision/Assistance - 24 hour (return to current ALF, pending available assist )    Equipment Recommendations  3 in 1 bedside commode;Other (comment) (TBD)           Precautions / Restrictions Precautions Precautions: Fall Precaution Comments: watch for orthostasis  Restrictions Weight Bearing Restrictions: No      Mobility Bed Mobility Overal bed mobility: Needs Assistance Bed Mobility: Supine to Sit;Sit to Supine     Supine to sit: Min guard Sit to supine: Min assist   General bed mobility comments: assist for LEs onto EOB, + dizziness with transition supine<>sitting   Transfers Overall transfer level: Needs assistance Equipment used: Rolling walker (2 wheeled) Transfers: Sit to/from [facility] Sit to Stand: Min guard Stand pivot transfers: Min assist       General transfer comment: pt requiring assist for balance, cues for safe hand placement and for safe RW use, stood x3 total during session    Balance Overall balance assessment: Needs assistance Sitting-balance support: No upper extremity supported;Feet supported Sitting balance-Leahy Scale: Fair Sitting balance - Comments: fair/good seated at EOB   Standing balance support: Bilateral upper extremity supported;During functional activity Standing balance-Leahy Scale: Poor Standing balance comment: reliant on UE support/external assist                            ADL either performed or assessed with clinical judgement   ADL Overall ADL's : Needs assistance/impaired Eating/Feeding: Modified independent;Sitting Eating/Feeding Details (indicate cue type and reason): pt setup with breakfast tray end of session, encouraged PO intake as pt initially stating she's not hungry  Grooming: Wash/dry face;Set up;Supervision/safety;Sitting   Upper Body Bathing: Minimal assistance;Sitting  Lower Body Bathing: Minimal assistance;Sit to/from stand   Upper Body Dressing : Set  up;Min guard;Sitting   Lower Body Dressing: Minimal assistance;Sit to/from stand Lower Body Dressing Details (indicate cue type and reason): pt using figure 4 to adjust her socks seated EOB Toilet Transfer: Minimal assistance;Ambulation;RW;BSC   Toileting- Water quality scientist and Hygiene: Sit to/from stand;Moderate assistance Toileting - Clothing Manipulation Details (indicate cue type and reason): assist for balance in standing and to assist with clothing management (briefs)      Functional mobility during ADLs: Minimal assistance;Rolling walker       Vision Baseline Vision/History: No visual deficits Vision Assessment?: Yes Eye Alignment: Within Functional Limits Ocular Range of Motion: Within Functional Limits Alignment/Gaze Preference: Within Defined Limits Tracking/Visual Pursuits: Decreased smoothness of horizontal tracking;Decreased smoothness of vertical tracking;Unable to hold eye position out of midline;Impaired - to be further tested in functional context Additional Comments: noted pt blinking frequently, no nystagmus noted with transitions but pt does endorse +dizziness with transition supine<>sitting. will continue to assess      Perception     Praxis      Pertinent Vitals/Pain Pain Assessment: No/denies pain     Hand Dominance Right   Extremity/Trunk Assessment Upper Extremity Assessment Upper Extremity Assessment: Generalized weakness   Lower Extremity Assessment Lower Extremity Assessment: Defer to PT evaluation       Communication Communication Communication: No difficulties   Cognition Arousal/Alertness: Awake/alert Behavior During Therapy: WFL for tasks assessed/performed Overall Cognitive Status: History of cognitive impairments - at baseline                                 General Comments: pt with hx of dementia, notable STM deficits, pt unable to recall which hospital she is in and continues to ask why they decided to bring her  in. repeating questions throughout. pleasantly confused    General Comments       Exercises     Shoulder Instructions      Home Living Family/patient expects to be discharged to:: Assisted living                             Home Equipment: Walker - 2 wheels;Wheelchair - manual          Prior Functioning/Environment Level of Independence: Needs assistance  Gait / Transfers Assistance Needed: Household ambulator using RW ADL's / Homemaking Assistance Needed: assisted by ALF staff            OT Problem List: Decreased strength;Decreased range of motion;Decreased activity tolerance;Impaired balance (sitting and/or standing);Decreased safety awareness;Decreased knowledge of use of DME or AE;Decreased cognition;Impaired vision/perception      OT Treatment/Interventions: Self-care/ADL training;Therapeutic exercise;Energy conservation;DME and/or AE instruction;Therapeutic activities;Cognitive remediation/compensation;Visual/perceptual remediation/compensation;Patient/family education;Balance training    OT Goals(Current goals can be found in the care plan section) Acute Rehab OT Goals Patient Stated Goal: return to ALF with ALF staff/family to assist OT Goal Formulation: With patient Time For Goal Achievement: 02/12/20 Potential to Achieve Goals: Good  OT Frequency: Min 2X/week   Barriers to D/C:            Co-evaluation              AM-PAC OT "6 Clicks" Daily Activity     Outcome Measure Help from another person eating meals?: A Little Help from another person taking care of personal grooming?: A Little Help from  another person toileting, which includes using toliet, bedpan, or urinal?: A Lot Help from another person bathing (including washing, rinsing, drying)?: A Little Help from another person to put on and taking off regular upper body clothing?: A Little Help from another person to put on and taking off regular lower body clothing?: A Little 6  Click Score: 17   End of Session Equipment Utilized During Treatment: Gait belt;Rolling walker Nurse Communication: Mobility status  Activity Tolerance: Patient tolerated treatment well Patient left: in bed;with call bell/phone within reach;with bed alarm set;with family/visitor present  OT Visit Diagnosis: Dizziness and giddiness (R42);Other symptoms and signs involving cognitive function;Unsteadiness on feet (R26.81)                Time: 3709-6438 OT Time Calculation (min): 44 min Charges:  OT General Charges $OT Visit: 1 Visit OT Evaluation $OT Eval Moderate Complexity: 1 Mod OT Treatments $Self Care/Home Management : 23-37 mins  Lou Cal, OT Acute Rehabilitation Services Pager [phone] Office [phone]   Raymondo Band 01/29/2020, 12:57 PM

## 2020-01-29 NOTE — Progress Notes (Signed)
VASCULAR LAB PRELIMINARY  PRELIMINARY  PRELIMINARY  PRELIMINARY  Carotid duplex completed.    Preliminary report:  See CV proc for preliminary results.  Natalea Sutliff, RVT 01/29/2020, 7:19 PM

## 2020-01-29 NOTE — Progress Notes (Signed)
STROKE TEAM PROGRESS NOTE   HISTORY OF PRESENT ILLNESS (per record) [name] is an 86 y.o. female resident of an assisted living facility with a PMHx of dementia, DM2, HTN, HLD, osteoporosis, cardiomyopathy and atrial fibrillation (not on any blood thinners), who initially presented to the [facility] ED on Thursday evening with a chief complaint of dizziness x 1 week in the context of being noted to be hypertensive on Thursday. She had stated that the dizziness was worse when she moved her head. Her son also endorsed some confusion starting about 2 days prior to presenting. She denied headache, visual changes, speech deficit, numbness or motor weakness. Her BP in the ED was initially 200/80.  Home medications include ASA 81 mg qd, simvastatin, Namenda and Aricept.  ED course: Labs revealed: - Microcytic anemia - Urinalysis positive for nitrites, leukocytes-large and WBC> 50 - UDS negative - MRI brain with 6 mm focus of diffusion abnormality involving the left parietal white matter consistent with an acute to early subacute small vessel type infarct.  - MRA head showed no large vessel occlusion with no hemodynamically significant or correctable stenosis EKG:  Sinus rhythm.  Prolonged QT interval.    INTERVAL HISTORY  I personally reviewed history of presenting illness,electronic. medical records and imaging films in PACS.  She presented with 2- 3 days of confusion in the setting of elevated blood pressure pre-existing dizziness.  MRI scan of the brain shows small left corona radiata infarct likely incidental finding.  MRA of the brain shows no large vessel stenosis or occlusion.  LDL cholesterol 79 mg percent.  Hemoglobin A1c 6.1.  Carotid ultrasound is pending    OBJECTIVE Vitals:   01/28/20 1930 01/28/20 2337 01/29/20 0351 01/29/20 0815  BP: (!) 187/89 (!) 177/82 130/90 (!) 162/73  Pulse: 79 92 97 71  Resp: 18 17 18 20   Temp: 98.3 F (36.8 C) (!) 97.5 F (36.4 C) 98 F (36.7 C) 98.2  F (36.8 C)  TempSrc: Oral Oral Oral Oral  SpO2: 99% 95% 95% 96%  Weight:      Height:        CBC:  Recent Labs  Lab 01/27/20 2055 01/28/20 0413  WBC 8.3 10.5  NEUTROABS 3.8  --   HGB 10.7* 10.2*  HCT 35.3* 33.7*  MCV 65.2* 65.3*  PLT 222 628    Basic Metabolic Panel:  Recent Labs  Lab 01/27/20 2055 01/28/20 0413  NA 138 140  K 3.7 3.6  CL 103 104  CO2 25 27  GLUCOSE 127* 128*  BUN 10 9  CREATININE 0.63 0.61  CALCIUM 9.0 9.0  MG  --  1.9  PHOS  --  3.7    Lipid Panel:     Component Value Date/Time   CHOL 144 01/27/2020 2055   TRIG 76 01/27/2020 2055   HDL 50 01/27/2020 2055   CHOLHDL 2.9 01/27/2020 2055   VLDL 15 01/27/2020 2055   LDLCALC 79 01/27/2020 2055   LDLCALC 68 01/04/2020 0945   HgbA1c:  Lab Results  Component Value Date   HGBA1C 6.1 (H) 01/27/2020   Urine Drug Screen:     Component Value Date/Time   LABOPIA NONE DETECTED 01/27/2020 2231   COCAINSCRNUR NONE DETECTED 01/27/2020 2231   LABBENZ NONE DETECTED 01/27/2020 2231   AMPHETMU NONE DETECTED 01/27/2020 2231   THCU NONE DETECTED 01/27/2020 2231   LABBARB NONE DETECTED 01/27/2020 2231    Alcohol Level     Component Value Date/Time   ETH <10  01/27/2020 2055    IMAGING  MR Brain Wo Contrast (neuro protocol) MR ANGIO HEAD WO CONTRAST 01/27/2020 IMPRESSION:  MRI HEAD  IMPRESSION:  1. 6 mm focus of diffusion abnormality involving the left periatrial white matter, consistent with an acute to early subacute small vessel type infarct. No associated hemorrhage or mass effect.  2. Temporal lobe predominant cerebral atrophy with mild to moderate chronic microvascular ischemic disease.   MRA HEAD  IMPRESSION:  Negative intracranial MRA. No large vessel occlusion. No hemodynamically significant or correctable stenosis.    Transthoracic Echocardiogram  11/16/2019 IMPRESSIONS  1. Left ventricular ejection fraction, by estimation, is 35 to 40%. The  left ventricle has moderately  decreased function. The left ventricle  demonstrates global hypokinesis. The left ventricular internal cavity size  was mildly dilated. There is mild  left ventricular hypertrophy. Left ventricular diastolic parameters are  consistent with Grade I diastolic dysfunction (impaired relaxation).  2. Right ventricular systolic function is normal. The right ventricular  size is normal. Tricuspid regurgitation signal is inadequate for assessing  PA pressure.  3. Left atrial size was moderately dilated.  4. The mitral valve is grossly normal. Mild mitral valve regurgitation.  5. The aortic valve is tricuspid. Aortic valve regurgitation is mild.  6. The inferior vena cava is normal in size with greater than 50%  respiratory variability, suggesting right atrial pressure of 3 mmHg.   Bilateral Carotid Dopplers  00/00/2021 Pending   ECG - SR rate 66 BPM. (See cardiology reading for complete details)   PHYSICAL EXAM Blood pressure (!) 162/73, pulse 71, temperature 98.2 F (36.8 C), temperature source Oral, resp. rate 20, height 5\' 2"  (1.575 m), weight 68 kg, SpO2 96 %. Pleasant elderly Caucasian lady not in distress. . Afebrile. Head is nontraumatic. Neck is supple without bruit.    Cardiac exam no murmur or gallop. Lungs are clear to auscultation. Distal pulses are well felt.  Neurological Exam ;  Awake  Alert oriented x 2.  Diminished attention, registration and recall.  Poor insight into her illness.  Normal speech and language.eye movements full without nystagmus.fundi were not visualized. Vision acuity and fields appear normal. Hearing is normal. Palatal movements are normal. Face symmetric. Tongue midline. Normal strength, tone, reflexes and coordination. Normal sensation. Gait deferred.      ASSESSMENT/PLAN [name] is a 86 y.o. female with history of dementia, DM2, HTN, HLD, osteoporosis, cardiomyopathy and atrial fibrillation (not on any blood thinners), who  initially presented to the [facility] ED on Thursday evening with a chief complaint of dizziness x 1 week, elevated BP 200/80, U/A c/w UTI, and some confusion starting about 2 days prior to presenting.  She did not receive IV t-PA due to late presentation (>4.5 hours from time of onset).  Stroke: 6 mm focus of diffusion abnormality involving the left periatrial white matter - small vessel disease vs embolic secondary to atrial fibrillation.   Resultant confusion and dizziness  Code Stroke CT Head - not ordered  CT head - not ordered  MRI head - 6 mm focus of diffusion abnormality involving the left periatrial white matter, consistent with an acute to early subacute small vessel type infarct. No associated hemorrhage or mass effect. Temporal lobe predominant cerebral atrophy with mild to moderate chronic microvascular ischemic disease.  MRA head - Negative intracranial MRA.  CTA H&N - not ordered  CT Perfusion - not ordered  Carotid Doppler - pending  2D Echo - 11/16/2019 - EF 35 - 40%.  No cardiac source of emboli identified.   Sars Corona Virus 2 - negative  LDL - 70  HgbA1c - 6.1  UDS - negative  VTE prophylaxis - [place] Heparin Diet  Diet Order            Diet regular Room service appropriate? Yes; Fluid consistency: Thin  Diet effective now                 aspirin 81 mg daily prior to admission, now on aspirin 325 mg daily  Patient and family will be counseled to be compliant with her antithrombotic medications  Ongoing aggressive stroke risk factor management  Therapy recommendations:  pending  Disposition:  Pending  Hypertension  Home BP meds: Cozaar ; aldactone ; Coreg  Current BP meds: Coreg  Stable . Permissive hypertension (OK if < 220/120) but gradually normalize in 5-7 days (avoid extreme htn with hx of cardiomyopathy) . Long-term BP goal normotensive  Hyperlipidemia  Home Lipid lowering medication: Zocor 40 mg daily  LDL 70, goal < 70  Current lipid  lowering medication: Zocor 40 mg daily  Continue statin at discharge  Diabetes  Home diabetic meds: none   Current diabetic meds: none  HgbA1c 6.1, goal < 7.0 No results for input(s): GLUCAP in the last 72 hours.  Other Stroke Risk Factors  Advanced age  26, Body mass index is 27.44 kg/m., recommend weight loss, diet and exercise as appropriate   Family hx stroke (mother)   Cardiomyopathy - EF - 10 - 40%  Atrial fibrillation  Other Active Problems  Code status - DNR  Anemia - Hgb 10.2  EF - 35 - 40%  Probable UTI - culture pending - afebrile  Hospital day # 2 Patient has presented with confusion and dizziness possibly related to underlying urinary tract infection and MRI shows tiny left subcortical lacunar infarct which is likely incidental.  She does have atrial fibrillation but may not be a good candidate for long-term anticoagulation given her significant dementia, dizziness and fall risk.  Recommend increased dose of aspirin to 325 mg daily and check carotid ultrasound.  Treat UTI per primary team.  Long discussion with the patient and her son at the bedside and answered questions.  Discussed with Dr. Emeline Gins. Greater than 50% time during the 35-minute visit was spent in counseling and coordination of care about her lacunar stroke and confusion and dementia and atrial fibrillation and answering questions Antony Contras, MD Medical Director [place] Pager: [phone] 01/29/2020 1:07 PM To contact Stroke Continuity provider, please refer to [email or web address] After hours, contact General Neurology

## 2020-01-30 DIAGNOSIS — N390 Urinary tract infection, site not specified: Secondary | ICD-10-CM | POA: Diagnosis not present

## 2020-01-30 DIAGNOSIS — I639 Cerebral infarction, unspecified: Secondary | ICD-10-CM | POA: Diagnosis not present

## 2020-01-30 DIAGNOSIS — I4891 Unspecified atrial fibrillation: Secondary | ICD-10-CM | POA: Diagnosis not present

## 2020-01-30 MED ORDER — ASPIRIN 325 MG PO TBEC: 325.0000 mg | DELAYED_RELEASE_TABLET | Freq: Every day | ORAL | 0 refills | Status: DC

## 2020-01-30 NOTE — Progress Notes (Signed)
Nurse called report to [facility] at [facility] [place]. All questions answered. Gwendolyn Grant, RN

## 2020-01-30 NOTE — Progress Notes (Signed)
Discharge instructions discussed with patient and son at bedside. All questions answered. No IV to remove, telemetry discontinued. Patient transported off unit via wheelchair for transport to [facility] by son.  Gwendolyn Grant, RN

## 2020-01-30 NOTE — Discharge Instructions (Signed)
Follow with ALF physician in 3 days  Get CBC, CMP,  checked  by Primary MD next visit.    Activity: As tolerated with Full fall precautions use walker/cane & assistance as needed   Disposition ALF   Diet: Heart Healthy  , with feeding assistance and aspiration precautions.  For Heart failure patients - Check your Weight same time everyday, if you gain over 2 pounds, or you develop in leg swelling, experience more shortness of breath or chest pain, call your Primary MD immediately. Follow Cardiac Low Salt Diet and 1.5 lit/day fluid restriction.   On your next visit with your primary care physician please Get Medicines reviewed and adjusted.   Please request your Prim.MD to go over all Hospital Tests and Procedure/Radiological results at the follow up, please get all Hospital records sent to your Prim MD by signing hospital release before you go home.   If you experience worsening of your admission symptoms, develop shortness of breath, life threatening emergency, suicidal or homicidal thoughts you must seek medical attention immediately by calling 911 or calling your MD immediately  if symptoms less severe.  You Must read complete instructions/literature along with all the possible adverse reactions/side effects for all the Medicines you take and that have been prescribed to you. Take any new Medicines after you have completely understood and accpet all the possible adverse reactions/side effects.   Do not drive, operating heavy machinery, perform activities at heights, swimming or participation in water activities or provide baby sitting services if your were admitted for syncope or siezures until you have seen by Primary MD or a Neurologist and advised to do so again.  Do not drive when taking Pain medications.    Do not take more than prescribed Pain, Sleep and Anxiety Medications  Special Instructions: If you have smoked or chewed Tobacco  in the last 2 yrs please stop smoking,  stop any regular Alcohol  and or any Recreational drug use.  Wear Seat belts while driving.   Please note  You were cared for by a hospitalist during your hospital stay. If you have any questions about your discharge medications or the care you received while you were in the hospital after you are discharged, you can call the unit and asked to speak with the hospitalist on call if the hospitalist that took care of you is not available. Once you are discharged, your primary care physician will handle any further medical issues. Please note that NO REFILLS for any discharge medications will be authorized once you are discharged, as it is imperative that you return to your primary care physician (or establish a relationship with a primary care physician if you do not have one) for your aftercare needs so that they can reassess your need for medications and monitor your lab values.

## 2020-01-30 NOTE — TOC Initial Note (Signed)
Transition of Care [facility]) - Initial/Assessment Note    Patient Details  Name: [name] MRN: [id number] Date of Birth: [birth year]  Transition of Care [facility]) CM/SW Contact:    Arvella Merles, [place] Phone Number: 01/30/2020, 9:48 AM  Clinical Narrative:                 CSW contacted patient's son Arnie to discuss discharge plan. Arnie expressed patient will be retuning to [facility] at discharge and one of her other sons are able to transport her to the facility at discharge.  CSW contacted [facility] [phone] and spoke with Sharyn Lull. CSW confirmed a fl2, HH orders and discharge summary are the only items needed. Items to be faxed to [phone].   Expected Discharge Plan: Assisted Living Barriers to Discharge: No Barriers Identified   Patient Goals and CMS Choice     Choice offered to / list presented to : Adult Children  Expected Discharge Plan and Services Expected Discharge Plan: Assisted Living In-house Referral: Clinical Social Work     Living arrangements for the past 2 months: [place]                                      Prior Living Arrangements/Services Living arrangements for the past 2 months: [place] Lives with:: Facility Resident Patient language and need for interpreter reviewed:: Yes Do you feel safe going back to the place where you live?: Yes      Need for Family Participation in Patient Care: Yes (Comment) Care giver support system in place?: Yes (comment)   Criminal Activity/Legal Involvement Pertinent to Current Situation/Hospitalization: No - Comment as needed  Activities of Daily Living      Permission Sought/Granted      Share Information with NAME: Arnie     Permission granted to share info w Relationship: Son  Permission granted to share info w Contact Information: [phone]  Emotional Assessment   Attitude/Demeanor/Rapport: Unable to Assess Affect  (typically observed): Unable to Assess Orientation: : Oriented to Self Alcohol / Substance Use: Not Applicable Psych Involvement: No (comment)  Admission diagnosis:  Dizziness [R42] Acute CVA (cerebrovascular accident) [facility]) [I63.9] Cerebrovascular accident (CVA), unspecified mechanism ([place]) [I63.9] Patient Active Problem List   Diagnosis Date Noted   Microcytic anemia 01/28/2020   Acute lower UTI 01/28/2020   Acute CVA (cerebrovascular accident) ([place]) 01/27/2020   Cardiomyopathy ([place]) 11/17/2019   Ascending aortic aneurysm ([place]) 11/17/2019   NSTEMI (non-ST elevated myocardial infarction) ([place]) 11/16/2019   Chest pain 11/15/2019   Postoperative anemia due to acute blood loss 09/08/2016   Closed intertrochanteric fracture of left hip, initial encounter ([place]) 09/05/2016   Dementia ([place]) 09/05/2016   Abdominal pain 01/10/2011   DERANGEMENT OF POSTERIOR HORN OF MEDIAL MENISCUS 09/25/2010   CLOSED DISLOCATION OF DISTAL RADIOULNAR 03/28/2010   COLONIC POLYPS, ADENOMATOUS, HX OF 07/31/2009   PAROXYSMAL ATRIAL FIBRILLATION 10/26/2008   Deficiency anemia 10/25/2008   OTHER THALASSEMIA 10/25/2008   DIVERTICULITIS, COLON 10/25/2008   Diabetes mellitus due to underlying condition, controlled ([place]) 10/24/2008   HYPERCHOLESTEROLEMIA 10/24/2008   Iron deficiency anemia 10/24/2008   Essential hypertension 10/24/2008   Osteoporosis 10/24/2008   HYPERTHYROIDISM, HX OF 10/24/2008   PCP:  Tilda Burrow, NP Pharmacy:   [facility], [place] [address] [place] [address] Phone: [phone] Fax: [phone]  [place],  [place] - [address] [place] [address] Phone: [phone] Fax: [phone]     Social Determinants of Health (SDOH) Interventions    Readmission Risk Interventions No flowsheet data found.

## 2020-01-30 NOTE — NC FL2 (Signed)
[place] LEVEL OF CARE SCREENING TOOL     IDENTIFICATION  Patient Name: [name] Birthdate: [birth year] Sex: female Admission Date (Current Location): 01/27/2020  [facility] and [place] Number:  Herbalist and Address:  [facility], [place] [address] [place], [place] [address]      Provider Number: [id number]  Attending Physician Name and Address:  Elgergawy, Silver Huguenin, MD  Relative Name and Phone Number:  Evania Lyne    Current Level of Care: Hospital Recommended Level of Care: [place] Prior Approval Number:    Date Approved/Denied:   PASRR Number:    Discharge Plan: Other (Comment) (ALF)    Current Diagnoses: Patient Active Problem List   Diagnosis Date Noted  . Microcytic anemia 01/28/2020  . Acute lower UTI 01/28/2020  . Acute CVA (cerebrovascular accident) ([place]) 01/27/2020  . Cardiomyopathy ([place]) 11/17/2019  . Ascending aortic aneurysm ([place]) 11/17/2019  . NSTEMI (non-ST elevated myocardial infarction) ([place]) 11/16/2019  . Chest pain 11/15/2019  . Postoperative anemia due to acute blood loss 09/08/2016  . Closed intertrochanteric fracture of left hip, initial encounter ([place]) 09/05/2016  . Dementia ([place]) 09/05/2016  . Abdominal pain 01/10/2011  . DERANGEMENT OF POSTERIOR HORN OF MEDIAL MENISCUS 09/25/2010  . CLOSED DISLOCATION OF DISTAL RADIOULNAR 03/28/2010  . COLONIC POLYPS, ADENOMATOUS, HX OF 07/31/2009  . PAROXYSMAL ATRIAL FIBRILLATION 10/26/2008  . Deficiency anemia 10/25/2008  . OTHER THALASSEMIA 10/25/2008  . DIVERTICULITIS, COLON 10/25/2008  . Diabetes mellitus due to underlying condition, controlled ([place]) 10/24/2008  . HYPERCHOLESTEROLEMIA 10/24/2008  . Iron deficiency anemia 10/24/2008  . Essential hypertension 10/24/2008  . Osteoporosis 10/24/2008  . HYPERTHYROIDISM, HX OF 10/24/2008    Orientation RESPIRATION BLADDER Height & Weight     Self  Normal Continent Weight: 150  lb (68 kg) Height:  5\' 2"  (157.5 cm)  BEHAVIORAL SYMPTOMS/MOOD NEUROLOGICAL BOWEL NUTRITION STATUS      Continent Diet (Regular) with feeding assistance and aspiration precautions.  AMBULATORY STATUS COMMUNICATION OF NEEDS Skin   Limited Assist Verbally Normal                       Personal Care Assistance Level of Assistance  Bathing, Feeding, Dressing Bathing Assistance: Limited assistance Feeding assistance: Limited assistance Dressing Assistance: Limited assistance     Functional Limitations Info  Sight, Hearing, Speech Sight Info: Adequate Hearing Info: Adequate Speech Info: Adequate    SPECIAL CARE FACTORS FREQUENCY  PT (By licensed PT), OT (By licensed OT)     PT Frequency: 5x a week OT Frequency: 5x a week            Contractures Contractures Info: Not present    Additional Factors Info  Code Status, Allergies Code Status Info: DNR Allergies Info: Bee Venom, Carbapenems, Cephalosporins, Procaine Hcl, Sulfonamide Derivatives, Penicillins           Current Medications (01/30/2020):    Discharge Medications: acetaminophen 325 MG tablet Commonly known as: TYLENOL Take 2 tablets (650 mg total) by mouth every 6 (six) hours as needed for mild pain, fever or headache.   alendronate 70 MG tablet Commonly known as: FOSAMAX Take 70 mg by mouth every Wednesday. Take with a full glass of water on an empty stomach.   aspirin 325 MG EC tablet Take 1 tablet (325 mg total) by mouth daily. Start taking on: January 31, 2020 What changed:   medication strength  how much to take  when  to take this   calcium citrate-vitamin D 500-400 MG-UNIT chewable tablet Chew by mouth 2 (two) times daily.   carvedilol 6.25 MG tablet Commonly known as: COREG Take 1 tablet (6.25 mg total) by mouth 2 (two) times daily with a meal.   Cranberry 450 MG Tabs Take 450 mg by mouth 2 (two) times daily.   cycloSPORINE 0.05 % ophthalmic emulsion Commonly known as:  RESTASIS Place 1 drop into both eyes 2 (two) times daily.   donepezil 5 MG tablet Commonly known as: ARICEPT Take 1 tablet (5 mg total) by mouth at bedtime.   ferrous sulfate 325 (65 FE) MG tablet Take 325 mg by mouth in the morning and at bedtime.   fish oil-omega-3 fatty acids 1000 MG capsule Take 2 g by mouth daily.   isosorbide mononitrate 30 MG 24 hr tablet Commonly known as: IMDUR Take 0.5 tablets (15 mg total) by mouth every evening.   lidocaine 5 % Commonly known as: Lidoderm Place 1 patch onto the skin daily. Remove & Discard patch within 12 hours or as directed by MD   losartan 50 MG tablet Commonly known as: COZAAR Take 1 tablet (50 mg total) by mouth daily.   meclizine 12.5 MG tablet Commonly known as: ANTIVERT Take 12.5 mg by mouth every 6 (six) hours as needed for dizziness.   memantine 10 MG tablet Commonly known as: NAMENDA Take 1 tablet (10 mg total) by mouth 2 (two) times daily.   nortriptyline 25 MG capsule Commonly known as: PAMELOR Take 25 mg by mouth at bedtime.   senna-docusate 8.6-50 MG tablet Commonly known as: Senokot-S Take 2 tablets by mouth at bedtime.   simvastatin 40 MG tablet Commonly known as: ZOCOR Take 1 tablet (40 mg total) by mouth every evening.   spironolactone 25 MG tablet Commonly known as: ALDACTONE Take 0.5 tablets (12.5 mg total) by mouth daily.   tamsulosin 0.4 MG Caps capsule Commonly known as: FLOMAX Take 1 capsule (0.4 mg total) by mouth daily after supper.        Relevant Imaging Results:  Relevant Lab Results:   Additional Information SSN [id number]  Neysa Hotter [place], [place]

## 2020-01-30 NOTE — TOC Transition Note (Signed)
Transition of Care [facility]) - CM/SW Discharge Note   Patient Details  Name: [name] MRN: [id number] Date of Birth: 1929/03/10  [facility] CM/SW Contact:  Arvella Merles, [place] Phone Number: 01/30/2020, 11:43 AM   Clinical Narrative:    [facility] to: [facility] Family notified: son, bedside Transport by: Programmer, applications, patient, patient's family, and facility notified of DC. Discharge Summary and FL2 sent to facility. RN to call report prior to discharge ([phone]). DC packet on chart.   CSW will sign off for now as social work intervention is no longer needed. Please consult [place] again if new needs arise.    Final next level of care: Assisted Living Barriers to Discharge: No Barriers Identified   Patient Goals and CMS Choice     Choice offered to / list presented to : Adult Children  Discharge Placement                       Discharge Plan and Services In-house Referral: Clinical Social Work                                   Social Determinants of Health (SDOH) Interventions     Readmission Risk Interventions No flowsheet data found.

## 2020-01-30 NOTE — Discharge Summary (Signed)
[name], is a 86 y.o. female  DOB 22-Feb-1929  MRN [id number].  Admission date:  01/27/2020  Admitting Physician  Bernadette Hoit, DO  Discharge Date:  01/30/2020   Primary MD  Tilda Burrow, NP  Recommendations for primary care physician for things to follow:  -Please check CBC, CMP during next visit. -Please continue with PT/OT.  Code status :DNR  Admission Diagnosis  Dizziness [R42] Acute CVA (cerebrovascular accident) ([place]) [I63.9] Cerebrovascular accident (CVA), unspecified mechanism ([place]) [I63.9]   Discharge Diagnosis  Dizziness [R42] Acute CVA (cerebrovascular accident) ([place]) [I63.9] Cerebrovascular accident (CVA), unspecified mechanism ([place]) [I63.9]    Active Problems:   HYPERCHOLESTEROLEMIA   Essential hypertension   PAROXYSMAL ATRIAL FIBRILLATION   Osteoporosis   Dementia ([place])   NSTEMI (non-ST elevated myocardial infarction) ([place])   Acute CVA (cerebrovascular accident) ([place])   Microcytic anemia   Acute lower UTI      Past Medical History:  Diagnosis Date  . Atrial fibrillation ([place])   . Cardiomyopathy ([place])    a. 10/2019: EF 35-40% by echo --> medical management recommended.   . Dementia ([place])   . Diverticulitis 8/09, 3/10   Abscess in 2009 (medical tx)  . Essential hypertension   . Hyperlipidemia   . Hyperthyroidism    Radiation, surgery  . IDA (iron deficiency anemia)   . Osteoporosis   . Postoperative anemia due to acute blood loss 09/08/2016  . Thalassemia   . Tubular adenoma of colon 12/15/08   Colonosocpy Dr Tonita Cong removed cecum  . Type 2 diabetes mellitus ([place])     Past Surgical History:  Procedure Laterality Date  . CESAREAN SECTION     x2  . CHOLECYSTECTOMY    . FOOT SURGERY     bilat  . INTRAMEDULLARY (IM) NAIL INTERTROCHANTERIC Left 09/06/2016   Procedure: LEFT HIP INTRAMEDULLARY (IM) NAIL;  Surgeon: Renette Butters, MD;  Location: [facility];   Service: Orthopedics;  Laterality: Left;  . THYROIDECTOMY, PARTIAL         History of present illness and  Hospital Course:     Kindly see H&P for history of present illness and admission details, please review complete Labs, Consult reports and Test reports for all details in brief  HPI  from the history and physical done on the day of admission 01/27/2020  HPI: [name] is a 86 y.o. female with medical history significant for atrial fibrillation not on anticoagulation, dementia, diabetes mellitus type 2, hypertension, hyperlipidemia and osteoporosis who presents to the ED due to 2-3 days of dizziness.  History was obtained from son who was at bedside, per son, dizziness worsens with head movement and she also presented with some confusion.  Patient alerted the nursing facility at the assisted living facility, EMS was activated and patient was taken to the ED for further evaluation and management.  Patient denies chest pain, shortness of breath, headache, nausea, vomiting or abdominal pain.    ED Course:  In the emergency department, BP was initially elevated at  200/80, but other vital signs are within normal range.  Work-up in the ED showed microcytic anemia.  Urinalysis was positive for nitrites, leukocytes-large and WBC> 50, however patient denies any burning sensation on urination, urinary frequency or urgency or any other irritative bladder symptoms.  Urine drug screen was negative.  MRI brain showed 6 mm focus of diffusion abnormality involving the left parietal white matter consistent with an acute to wholly subacute small vessel type infarct.  MRA head showed negative intracranial MRA and no large vessel occlusion with no hemodynamically significant or correctable stenosis.  She was treated with IV ciprofloxacin.  Neurologist at [facility] was consulted and accepted patient for transfer per ED physician.  Hospitalist was asked to admit patient for further evaluation and  management.  Hospital Course   Acute CVA -With resultant confusion and dizziness, - MRI head -6 mm focus of diffusion abnormality involving the left periatrial white matter, consistent with an acute to early subacute small vessel type infarct. No associated hemorrhage or mass effect. Temporal lobe predominant cerebral atrophy with mild to moderate chronic microvascular ischemic disease. - MRA head- Negative intracranial MRA. - Carotid Doppler-no significant stenosis - 2D Echo -11/16/2019- WU98- 40%. No cardiac source of emboli identified -Neurology input greatly appreciated, will complete her work-up including carotid Doppler to evaluate for large vessel disease, she does have atrial fibrillation, but she is not a good candidate for long-term anticoagulation given her significant dementia, dizziness and falls risk, so recommendation to increase aspirin from 81 mg to 325 mg oral daily. -PT/OT consulted  2-HYPERCHOLESTEROLEMIA -Continue statins.  3-Essential hypertension -Blood pressure elevated and initially uncontrolled. -Much improved when she is back on her home regimen. -Heart healthy diet has been ordered.  4-PAROXYSMAL ATRIAL FIBRILLATION -Currently rate control and per EKG sinus rhythm. -Continue beta-blocker for rate management -Not on anticoagulation secondary to high fall risk. -CHADsVASC score 5-6, not a candidate for anticoagulation  5-Dementia (HCC) -No behavioral disturbances appreciated -Continue the use of Aricept -Constant reorientation recommended. -Continue supportive care.  6-chronic systolic heart failure -Last ejection fraction 35 to 40% -She appears to be euvolemic -Patient actively followed by cardiology as an outpatient.   UTI -treated with fosfomycin, given multiple allergies, and prolonged QTC which prohibit use of quinolones.     Discharge Condition:  Stable      Discharge Instructions  and  Discharge Medications     Discharge Instructions    Discharge instructions   Complete by: As directed    Follow with ALF physician in 3 days  Get CBC, CMP,  checked  by Primary MD next visit.    Activity: As tolerated with Full fall precautions use walker/cane & assistance as needed   Disposition ALF   Diet: Heart Healthy  , with feeding assistance and aspiration precautions.  For Heart failure patients - Check your Weight same time everyday, if you gain over 2 pounds, or you develop in leg swelling, experience more shortness of breath or chest pain, call your Primary MD immediately. Follow Cardiac Low Salt Diet and 1.5 lit/day fluid restriction.   On your next visit with your primary care physician please Get Medicines reviewed and adjusted.   Please request your Prim.MD to go over all Hospital Tests and Procedure/Radiological results at the follow up, please get all Hospital records sent to your Prim MD by signing hospital release before you go home.   If you experience worsening of your admission symptoms, develop shortness of breath, life threatening emergency, suicidal or  homicidal thoughts you must seek medical attention immediately by calling 911 or calling your MD immediately  if symptoms less severe.  You Must read complete instructions/literature along with all the possible adverse reactions/side effects for all the Medicines you take and that have been prescribed to you. Take any new Medicines after you have completely understood and accpet all the possible adverse reactions/side effects.   Do not drive, operating heavy machinery, perform activities at heights, swimming or participation in water activities or provide baby sitting services if your were admitted for syncope or siezures until you have seen by Primary MD or a Neurologist and advised to do so again.  Do not drive when taking Pain medications.    Do not take more than prescribed Pain, Sleep and Anxiety Medications  Special  Instructions: If you have smoked or chewed Tobacco  in the last 2 yrs please stop smoking, stop any regular Alcohol  and or any Recreational drug use.  Wear Seat belts while driving.   Please note  You were cared for by a hospitalist during your hospital stay. If you have any questions about your discharge medications or the care you received while you were in the hospital after you are discharged, you can call the unit and asked to speak with the hospitalist on call if the hospitalist that took care of you is not available. Once you are discharged, your primary care physician will handle any further medical issues. Please note that NO REFILLS for any discharge medications will be authorized once you are discharged, as it is imperative that you return to your primary care physician (or establish a relationship with a primary care physician if you do not have one) for your aftercare needs so that they can reassess your need for medications and monitor your lab values.   Increase activity slowly   Complete by: As directed      Allergies as of 01/30/2020      Reactions   Bee Venom    Listed per Nursing Center-No reaction is noted   Carbapenems    Listed per Nursing Center-No reaction is noted   Cephalosporins    Listed per Nursing Center-No reaction is noted   Procaine Hcl Other (See Comments)   Numbness/jerkiness   Sulfonamide Derivatives Other (See Comments)   hypertension   Penicillins Rash      Medication List    STOP taking these medications   iron polysaccharides 150 MG capsule Commonly known as: NIFEREX     TAKE these medications   acetaminophen 325 MG tablet Commonly known as: TYLENOL Take 2 tablets (650 mg total) by mouth every 6 (six) hours as needed for mild pain, fever or headache.   alendronate 70 MG tablet Commonly known as: FOSAMAX Take 70 mg by mouth every Wednesday. Take with a full glass of water on an empty stomach.   aspirin 325 MG EC tablet Take 1 tablet  (325 mg total) by mouth daily. Start taking on: January 31, 2020 What changed:   medication strength  how much to take  when to take this   calcium citrate-vitamin D 500-400 MG-UNIT chewable tablet Chew by mouth 2 (two) times daily.   carvedilol 6.25 MG tablet Commonly known as: COREG Take 1 tablet (6.25 mg total) by mouth 2 (two) times daily with a meal.   Cranberry 450 MG Tabs Take 450 mg by mouth 2 (two) times daily.   cycloSPORINE 0.05 % ophthalmic emulsion Commonly known as: RESTASIS Place 1 drop  into both eyes 2 (two) times daily.   donepezil 5 MG tablet Commonly known as: ARICEPT Take 1 tablet (5 mg total) by mouth at bedtime.   ferrous sulfate 325 (65 FE) MG tablet Take 325 mg by mouth in the morning and at bedtime.   fish oil-omega-3 fatty acids 1000 MG capsule Take 2 g by mouth daily.   isosorbide mononitrate 30 MG 24 hr tablet Commonly known as: IMDUR Take 0.5 tablets (15 mg total) by mouth every evening.   lidocaine 5 % Commonly known as: Lidoderm Place 1 patch onto the skin daily. Remove & Discard patch within 12 hours or as directed by MD   losartan 50 MG tablet Commonly known as: COZAAR Take 1 tablet (50 mg total) by mouth daily.   meclizine 12.5 MG tablet Commonly known as: ANTIVERT Take 12.5 mg by mouth every 6 (six) hours as needed for dizziness.   memantine 10 MG tablet Commonly known as: NAMENDA Take 1 tablet (10 mg total) by mouth 2 (two) times daily.   nortriptyline 25 MG capsule Commonly known as: PAMELOR Take 25 mg by mouth at bedtime.   senna-docusate 8.6-50 MG tablet Commonly known as: Senokot-S Take 2 tablets by mouth at bedtime.   simvastatin 40 MG tablet Commonly known as: ZOCOR Take 1 tablet (40 mg total) by mouth every evening.   spironolactone 25 MG tablet Commonly known as: ALDACTONE Take 0.5 tablets (12.5 mg total) by mouth daily.   tamsulosin 0.4 MG Caps capsule Commonly known as: FLOMAX Take 1 capsule (0.4 mg  total) by mouth daily after supper.         Diet and Activity recommendation: See Discharge Instructions above   Consults obtained -  Neurology   Major procedures and Radiology Reports - PLEASE review detailed and final reports for all details, in brief -      MR ANGIO HEAD WO CONTRAST  Result Date: 01/27/2020 CLINICAL DATA:  Initial evaluation for acute dizziness, hypertension. EXAM: MRI HEAD WITHOUT CONTRAST MRA HEAD WITHOUT CONTRAST TECHNIQUE: Multiplanar, multiecho pulse sequences of the brain and surrounding structures were obtained without intravenous contrast. Angiographic images of the head were obtained using MRA technique without contrast. COMPARISON:  Comparison made with prior head CT from 09/05/2016. FINDINGS: MRI HEAD FINDINGS Brain: Temporal lobe predominant cerebral atrophy noted. Patchy T2/FLAIR hyperintensity within the periventricular and deep white matter both cerebral hemispheres most consistent with chronic small vessel ischemic disease, mild to moderate in nature. Mild patchy involvement of the pons noted. Superimposed small remote lacunar infarct noted at the right basal ganglia. 6 mm focus of diffusion abnormality seen involving the left periatrial white matter (series 3, image 29), consistent with an acute to early subacute small vessel type infarct. No associated hemorrhage or mass effect. No other diffusion abnormality to suggest acute or subacute ischemia. Gray-white matter differentiation otherwise maintained. No encephalomalacia to suggest chronic cortical infarction. No evidence for acute intracranial hemorrhage. Few scattered punctate foci of susceptibility artifact noted involving the supratentorial brain, consistent with small chronic micro hemorrhages, favored to be hypertensive in nature. No mass lesion, midline shift or mass effect. No hydrocephalus or extra-axial fluid collection. Pituitary gland suprasellar region normal. Midline structures intact.  Vascular: Major intracranial vascular flow voids are maintained. Skull and upper cervical spine: Craniocervical junction within normal limits. Upper cervical spine normal. Bone marrow signal intensity within normal limits. No scalp soft tissue abnormality. Sinuses/Orbits: Patient status post bilateral ocular lens replacement. Paranasal sinuses are largely clear. Trace right mastoid  effusion noted, of doubtful significance. Inner ear structures grossly normal. Other: None. MRA HEAD FINDINGS ANTERIOR CIRCULATION: Visualized distal cervical segments of the internal carotid arteries are patent with symmetric antegrade flow. Petrous, cavernous, and supraclinoid ICAs widely patent without stenosis or other abnormality. ICA termini well perfused. A1 segments patent bilaterally. Normal anterior communicating artery complex. Anterior cerebral arteries widely patent to their distal aspects without stenosis. No M1 stenosis or occlusion. Normal MCA bifurcations. Distal MCA branches well perfused and symmetric. POSTERIOR CIRCULATION: Vertebral arteries patent to the vertebrobasilar junction without stenosis. Left vertebral artery dominant. Both picas patent proximally. Basilar widely patent to its distal aspect without stenosis. Superior cerebral arteries patent bilaterally. Both PCAs primarily supplied via the basilar and are well perfused or distal aspects without stenosis. Small bilateral posterior communicating arteries noted. No intracranial aneurysm or other vascular abnormality. IMPRESSION: MRI HEAD IMPRESSION: 1. 6 mm focus of diffusion abnormality involving the left periatrial white matter, consistent with an acute to early subacute small vessel type infarct. No associated hemorrhage or mass effect. 2. Temporal lobe predominant cerebral atrophy with mild to moderate chronic microvascular ischemic disease. MRA HEAD IMPRESSION: Negative intracranial MRA. No large vessel occlusion. No hemodynamically significant or  correctable stenosis. Electronically Signed   By: Jeannine Boga M.D.   On: 01/27/2020 21:54   MR Brain Wo Contrast (neuro protocol)  Result Date: 01/27/2020 CLINICAL DATA:  Initial evaluation for acute dizziness, hypertension. EXAM: MRI HEAD WITHOUT CONTRAST MRA HEAD WITHOUT CONTRAST TECHNIQUE: Multiplanar, multiecho pulse sequences of the brain and surrounding structures were obtained without intravenous contrast. Angiographic images of the head were obtained using MRA technique without contrast. COMPARISON:  Comparison made with prior head CT from 09/05/2016. FINDINGS: MRI HEAD FINDINGS Brain: Temporal lobe predominant cerebral atrophy noted. Patchy T2/FLAIR hyperintensity within the periventricular and deep white matter both cerebral hemispheres most consistent with chronic small vessel ischemic disease, mild to moderate in nature. Mild patchy involvement of the pons noted. Superimposed small remote lacunar infarct noted at the right basal ganglia. 6 mm focus of diffusion abnormality seen involving the left periatrial white matter (series 3, image 29), consistent with an acute to early subacute small vessel type infarct. No associated hemorrhage or mass effect. No other diffusion abnormality to suggest acute or subacute ischemia. Gray-white matter differentiation otherwise maintained. No encephalomalacia to suggest chronic cortical infarction. No evidence for acute intracranial hemorrhage. Few scattered punctate foci of susceptibility artifact noted involving the supratentorial brain, consistent with small chronic micro hemorrhages, favored to be hypertensive in nature. No mass lesion, midline shift or mass effect. No hydrocephalus or extra-axial fluid collection. Pituitary gland suprasellar region normal. Midline structures intact. Vascular: Major intracranial vascular flow voids are maintained. Skull and upper cervical spine: Craniocervical junction within normal limits. Upper cervical spine normal.  Bone marrow signal intensity within normal limits. No scalp soft tissue abnormality. Sinuses/Orbits: Patient status post bilateral ocular lens replacement. Paranasal sinuses are largely clear. Trace right mastoid effusion noted, of doubtful significance. Inner ear structures grossly normal. Other: None. MRA HEAD FINDINGS ANTERIOR CIRCULATION: Visualized distal cervical segments of the internal carotid arteries are patent with symmetric antegrade flow. Petrous, cavernous, and supraclinoid ICAs widely patent without stenosis or other abnormality. ICA termini well perfused. A1 segments patent bilaterally. Normal anterior communicating artery complex. Anterior cerebral arteries widely patent to their distal aspects without stenosis. No M1 stenosis or occlusion. Normal MCA bifurcations. Distal MCA branches well perfused and symmetric. POSTERIOR CIRCULATION: Vertebral arteries patent to the vertebrobasilar junction without stenosis.  Left vertebral artery dominant. Both picas patent proximally. Basilar widely patent to its distal aspect without stenosis. Superior cerebral arteries patent bilaterally. Both PCAs primarily supplied via the basilar and are well perfused or distal aspects without stenosis. Small bilateral posterior communicating arteries noted. No intracranial aneurysm or other vascular abnormality. IMPRESSION: MRI HEAD IMPRESSION: 1. 6 mm focus of diffusion abnormality involving the left periatrial white matter, consistent with an acute to early subacute small vessel type infarct. No associated hemorrhage or mass effect. 2. Temporal lobe predominant cerebral atrophy with mild to moderate chronic microvascular ischemic disease. MRA HEAD IMPRESSION: Negative intracranial MRA. No large vessel occlusion. No hemodynamically significant or correctable stenosis. Electronically Signed   By: Jeannine Boga M.D.   On: 01/27/2020 21:54   VAS US CAROTID  Result Date: 01/30/2020 Carotid Arterial Duplex Study  Indications:       CVA and Dizziness. Risk Factors:      Hypertension, hyperlipidemia, Diabetes. Other Factors:     Atrial fibrillation. Comparison Study:  No prior study on file Performing Technologist: Sharion Dove RVS  Examination Guidelines: A complete evaluation includes B-mode imaging, spectral Doppler, color Doppler, and power Doppler as needed of all accessible portions of each vessel. Bilateral testing is considered an integral part of a complete examination. Limited examinations for reoccurring indications may be performed as noted.  Right Carotid Findings: +----------+--------+--------+--------+------------------+------------------+           PSV cm/sEDV cm/sStenosisPlaque DescriptionComments           +----------+--------+--------+--------+------------------+------------------+ CCA Prox  57      6                                 intimal thickening +----------+--------+--------+--------+------------------+------------------+ CCA Distal69      15                                intimal thickening +----------+--------+--------+--------+------------------+------------------+ ICA Prox  43      13              heterogenous      tortuous           +----------+--------+--------+--------+------------------+------------------+ ICA Distal51      12                                tortuous           +----------+--------+--------+--------+------------------+------------------+ ECA       40      2                                                    +----------+--------+--------+--------+------------------+------------------+ +----------+--------+-------+--------+-------------------+           PSV cm/sEDV cmsDescribeArm Pressure (mmHG) +----------+--------+-------+--------+-------------------+ [id number]                                         +----------+--------+-------+--------+-------------------+ +---------+--------+--+--------+-+ VertebralPSV cm/s21EDV  cm/s5 +---------+--------+--+--------+-+  Left Carotid Findings: +----------+--------+--------+--------+------------------+------------------+           PSV cm/sEDV cm/sStenosisPlaque DescriptionComments           +----------+--------+--------+--------+------------------+------------------+  CCA Prox  80      11                                intimal thickening +----------+--------+--------+--------+------------------+------------------+ CCA Distal58      15                                intimal thickening +----------+--------+--------+--------+------------------+------------------+ ICA Prox  42      13              heterogenous                         +----------+--------+--------+--------+------------------+------------------+ ICA Distal31      9                                                    +----------+--------+--------+--------+------------------+------------------+ ECA       65      15                                                   +----------+--------+--------+--------+------------------+------------------+ +----------+--------+--------+--------+-------------------+           PSV cm/sEDV cm/sDescribeArm Pressure (mmHG) +----------+--------+--------+--------+-------------------+ Subclavian100                                         +----------+--------+--------+--------+-------------------+ +---------+--------+--+--------+--+ VertebralPSV cm/s36EDV cm/s12 +---------+--------+--+--------+--+   Summary: Right Carotid: The extracranial vessels were near-normal with only minimal wall                thickening or plaque. Left Carotid: The extracranial vessels were near-normal with only minimal wall               thickening or plaque. Vertebrals:  Bilateral vertebral arteries demonstrate antegrade flow. Subclavians: Normal flow hemodynamics were seen in bilateral subclavian              arteries. *See table(s) above for measurements and  observations.  Electronically signed by Antony Contras MD on 01/30/2020 at 11:07:46 AM.    Final     Micro Results     Recent Results (from the past 240 hour(s))  SARS Coronavirus 2 by RT PCR (hospital order, performed in [facility] hospital lab) Nasopharyngeal Nasopharyngeal Swab     Status: None   Collection Time: 01/27/20 10:15 PM   Specimen: Nasopharyngeal Swab  Result Value Ref Range Status   SARS Coronavirus 2 NEGATIVE NEGATIVE Final    Comment: (NOTE) SARS-CoV-2 target nucleic acids are NOT DETECTED.  The SARS-CoV-2 RNA is generally detectable in upper and lower respiratory specimens during the acute phase of infection. The lowest concentration of SARS-CoV-2 viral copies this assay can detect is 250 copies / mL. A negative result does not preclude SARS-CoV-2 infection and should not be used as the sole basis for treatment or other patient management decisions.  A negative result may occur with improper specimen collection / handling, submission of specimen other than nasopharyngeal swab, presence of viral mutation(s)  within the areas targeted by this assay, and inadequate number of viral copies (<250 copies / mL). A negative result must be combined with clinical observations, patient history, and epidemiological information.  Fact Sheet for Patients:   StrictlyIdeas.no  Fact Sheet for Healthcare Providers: BankingDealers.co.za  This test is not yet approved or  cleared by the [place] FDA and has been authorized for detection and/or diagnosis of SARS-CoV-2 by FDA under an Emergency Use Authorization (EUA).  This EUA will remain in effect (meaning this test can be used) for the duration of the COVID-19 declaration under Section 564(b)(1) of the Act, 21 U.S.C. section 360bbb-3(b)(1), unless the authorization is terminated or revoked sooner.  Performed at [facility], [address] [place], [place] [address]   Urine  Culture     Status: Abnormal   Collection Time: 01/27/20 10:17 PM   Specimen: Urine, Clean Catch  Result Value Ref Range Status   Specimen Description   Final    URINE, CLEAN CATCH Performed at [facility], [address] [place], [place] [address]    Special Requests   Final    NONE Performed at [facility], [address], [place], [place] [address]    Culture MULTIPLE SPECIES PRESENT, SUGGEST RECOLLECTION (A)  Final   Report Status 01/29/2020 FINAL  Final  MRSA PCR Screening     Status: None   Collection Time: 01/29/20 12:13 AM   Specimen: Nasal Mucosa; Nasopharyngeal  Result Value Ref Range Status   MRSA by PCR NEGATIVE NEGATIVE Final    Comment:        The GeneXpert MRSA Assay (FDA approved for NASAL specimens only), is one component of a comprehensive MRSA colonization surveillance program. It is not intended to diagnose MRSA infection nor to guide or monitor treatment for MRSA infections. Performed at [facility], [place] [address], [place], [place] [address]        Today   Subjective:   [name] today denies any new complaints, denies any nausea or vomiting, still reports her appetite is poor.  Objective:   Blood pressure 136/79, pulse 87, temperature 98 F (36.7 C), temperature source Axillary, resp. rate 16, height 5\' 2"  (1.575 m), weight 68 kg, SpO2 98 %.  No intake or output data in the 24 hours ending 01/30/20 1115  Exam  Awake Alert, pleasent, Normal affect Symmetrical Chest wall movement, Good air movement bilaterally, CTAB RRR,No Gallops,Rubs or new Murmurs, No Parasternal Heave +ve B.Sounds, Abd Soft, No tenderness, No rebound - guarding or rigidity. No Cyanosis, Clubbing or edema, No new Rash or bruise     Data Review   CBC w Diff:  Lab Results  Component Value Date   WBC 10.5 01/28/2020   HGB 10.2 (L) 01/28/2020   HCT 33.7 (L) 01/28/2020   PLT 231 01/28/2020   LYMPHOPCT 38 01/27/2020   MONOPCT 13 01/27/2020   EOSPCT  3 01/27/2020   BASOPCT 0 01/27/2020    CMP:  Lab Results  Component Value Date   NA 140 01/28/2020   K 3.6 01/28/2020   CL 104 01/28/2020   CO2 27 01/28/2020   BUN 9 01/28/2020   CREATININE 0.61 01/28/2020   CREATININE 0.61 01/10/2011   PROT 6.1 (L) 01/28/2020   ALBUMIN 3.9 01/28/2020   BILITOT 0.9 01/28/2020   BILITOT 1.3 01/14/2011   ALKPHOS 52 01/28/2020   AST 21 01/28/2020   ALT 22 01/28/2020  .   Total Time in preparing paper work, data  evaluation and todays exam - 61 minutes  Phillips Climes M.D on 01/30/2020 at 11:15 AM  [facility]   Office  [phone]

## 2020-01-30 NOTE — Progress Notes (Signed)
STROKE TEAM PROGRESS NOTE       INTERVAL HISTORY Patient is sitting up in bed.  Her son is at the bedside.  She seems to doing better and confusion and mental status have improved.  Plan is to send her  Back to skilled nursing facility when bed available   OBJECTIVE Vitals:   01/30/20 0022 01/30/20 0449 01/30/20 0712 01/30/20 1131  BP: (!) 130/58 (!) 122/50 136/79 (!) 146/68  Pulse: 69 87 87 92  Resp: 18 18 16 18   Temp: 97.9 F (36.6 C) (!) 97.5 F (36.4 C) 98 F (36.7 C) 98.1 F (36.7 C)  TempSrc: Oral Oral Axillary Oral  SpO2: 93% 97% 98% 97%  Weight:      Height:        CBC:  Recent Labs  Lab 01/27/20 2055 01/28/20 0413  WBC 8.3 10.5  NEUTROABS 3.8  --   HGB 10.7* 10.2*  HCT 35.3* 33.7*  MCV 65.2* 65.3*  PLT 222 397    Basic Metabolic Panel:  Recent Labs  Lab 01/27/20 2055 01/28/20 0413  NA 138 140  K 3.7 3.6  CL 103 104  CO2 25 27  GLUCOSE 127* 128*  BUN 10 9  CREATININE 0.63 0.61  CALCIUM 9.0 9.0  MG  --  1.9  PHOS  --  3.7    Lipid Panel:     Component Value Date/Time   CHOL 144 01/27/2020 2055   TRIG 76 01/27/2020 2055   HDL 50 01/27/2020 2055   CHOLHDL 2.9 01/27/2020 2055   VLDL 15 01/27/2020 2055   LDLCALC 79 01/27/2020 2055   LDLCALC 68 01/04/2020 0945   HgbA1c:  Lab Results  Component Value Date   HGBA1C 6.1 (H) 01/27/2020   Urine Drug Screen:     Component Value Date/Time   LABOPIA NONE DETECTED 01/27/2020 2231   COCAINSCRNUR NONE DETECTED 01/27/2020 2231   LABBENZ NONE DETECTED 01/27/2020 2231   AMPHETMU NONE DETECTED 01/27/2020 2231   THCU NONE DETECTED 01/27/2020 2231   LABBARB NONE DETECTED 01/27/2020 2231    Alcohol Level     Component Value Date/Time   ETH <10 01/27/2020 2055    IMAGING  MR Brain Wo Contrast (neuro protocol) MR ANGIO HEAD WO CONTRAST 01/27/2020 IMPRESSION:  MRI HEAD  IMPRESSION:  1. 6 mm focus of diffusion abnormality involving the left periatrial white matter, consistent with an acute to  early subacute small vessel type infarct. No associated hemorrhage or mass effect.  2. Temporal lobe predominant cerebral atrophy with mild to moderate chronic microvascular ischemic disease.   MRA HEAD  IMPRESSION:  Negative intracranial MRA. No large vessel occlusion. No hemodynamically significant or correctable stenosis.    Transthoracic Echocardiogram  11/16/2019 IMPRESSIONS  1. Left ventricular ejection fraction, by estimation, is 35 to 40%. The  left ventricle has moderately decreased function. The left ventricle  demonstrates global hypokinesis. The left ventricular internal cavity size  was mildly dilated. There is mild  left ventricular hypertrophy. Left ventricular diastolic parameters are  consistent with Grade I diastolic dysfunction (impaired relaxation).  2. Right ventricular systolic function is normal. The right ventricular  size is normal. Tricuspid regurgitation signal is inadequate for assessing  PA pressure.  3. Left atrial size was moderately dilated.  4. The mitral valve is grossly normal. Mild mitral valve regurgitation.  5. The aortic valve is tricuspid. Aortic valve regurgitation is mild.  6. The inferior vena cava is normal in size with greater than 50%  respiratory  variability, suggesting right atrial pressure of 3 mmHg.   Bilateral Carotid Dopplers  01/29/2020 Summary:  Right Carotid: The extracranial vessels were near-normal with only minimal wall thickening or plaque.  Left Carotid: The extracranial vessels were near-normal with only minimal wall thickening or plaque.  Vertebrals: Bilateral vertebral arteries demonstrate antegrade flow.  Subclavians: Normal flow hemodynamics were seen in bilateral subclavian arteries.    ECG - SR rate 66 BPM. (See cardiology reading for complete details)   PHYSICAL EXAM Blood pressure (!) 146/68, pulse 92, temperature 98.1 F (36.7 C), temperature source Oral, resp. rate 18, height 5\' 2"  (1.575 m), weight 68  kg, SpO2 97 %. Pleasant elderly Caucasian lady not in distress. . Afebrile. Head is nontraumatic. Neck is supple without bruit.    Cardiac exam no murmur or gallop. Lungs are clear to auscultation. Distal pulses are well felt.  Neurological Exam ;  Awake  Alert oriented x 2.  Diminished attention, registration and recall.     Normal speech and language.eye movements full without nystagmus.fundi were not visualized. Vision acuity and fields appear normal. Hearing is normal. Palatal movements are normal. Face symmetric. Tongue midline. Normal strength, tone, reflexes and coordination. Normal sensation. Gait deferred.      ASSESSMENT/PLAN [name] is a 86 y.o. female with history of dementia, DM2, HTN, HLD, osteoporosis, cardiomyopathy and atrial fibrillation (not on any blood thinners), who initially presented to [facility] on Thursday evening with a chief complaint of dizziness x 1 week, elevated BP 200/80, U/A c/w UTI, and some confusion starting about 2 days prior to presenting.  She did not receive IV t-PA due to late presentation (>4.5 hours from time of onset).  Stroke: 6 mm focus of diffusion abnormality involving the left periatrial white matter - small vessel disease vs embolic secondary to atrial fibrillation.   Resultant confusion and dizziness  Code Stroke CT Head - not ordered  CT head - not ordered  MRI head - 6 mm focus of diffusion abnormality involving the left periatrial white matter, consistent with an acute to early subacute small vessel type infarct. No associated hemorrhage or mass effect. Temporal lobe predominant cerebral atrophy with mild to moderate chronic microvascular ischemic disease.  MRA head - Negative intracranial MRA.  CTA H&N - not ordered  CT Perfusion - not ordered  Carotid Dopplers - unremarkable  2D Echo - 11/16/2019 - EF 35 - 40%. No cardiac source of emboli identified.   Sars Corona Virus 2 - negative  LDL - 70  HgbA1c -  6.1  UDS - negative  VTE prophylaxis - Gray Heparin Diet  Diet Order            Diet regular Room service appropriate? Yes; Fluid consistency: Thin  Diet effective now                 aspirin 81 mg daily prior to admission, now on aspirin 325 mg daily  Patient and family will be counseled to be compliant with her antithrombotic medications  Ongoing aggressive stroke risk factor management  Therapy recommendations:  HH OT and PT recommended  Disposition:  Pending  Hypertension  Home BP meds: Cozaar ; aldactone ; Coreg  Current BP meds: Coreg  Stable . Permissive hypertension (OK if < 220/120) but gradually normalize in 5-7 days (avoid extreme htn with hx of cardiomyopathy) . Long-term BP goal normotensive  Hyperlipidemia  Home Lipid lowering medication: Zocor 40 mg daily  LDL 70, goal < 70  Current lipid lowering medication: Zocor 40 mg daily  Continue statin at discharge  Diabetes  Home diabetic meds: none   Current diabetic meds: none  HgbA1c 6.1, goal < 7.0 No results for input(s): GLUCAP in the last 72 hours.  Other Stroke Risk Factors  Advanced age  22, Body mass index is 27.44 kg/m., recommend weight loss, diet and exercise as appropriate   Family hx stroke (mother)   Cardiomyopathy - EF - 29 - 40%  Atrial fibrillation  Other Active Problems  Code status - DNR  Anemia - Hgb 10.2  EF - 35 - 40%  Probable UTI - afebrile - culture - multiple species - recollection recommended  Hospital day # 3 Patient has presented with confusion and dizziness possibly related to underlying urinary tract infection and MRI shows tiny left subcortical lacunar infarct which is likely incidental.  She does have atrial fibrillation but may not be a good candidate for long-term anticoagulation given her significant dementia, dizziness and fall risk.  Recommend increased dose of aspirin to 325 mg daily    Treat UTI per primary team.  Long discussion with  the patient and her son at the bedside and answered questions.  Discussed with Dr. Emeline Gins. and patient's son.  Stroke team will sign off.  Kindly call for questions Vanessa Contras, MD To contact Stroke Continuity provider, please refer to [email or web address] After hours, contact General Neurology

## 2020-02-23 ENCOUNTER — Inpatient Hospital Stay (HOSPITAL_COMMUNITY)
Admission: EM | Admit: 2020-02-23 | Discharge: 2020-02-25 | DRG: 815 | Disposition: A | Discharge status: Home / Self Care | Payer: Medicare HMO | Source: Skilled Nursing Facility | Attending: Internal Medicine | Admitting: Internal Medicine

## 2020-02-23 ENCOUNTER — Emergency Department (HOSPITAL_COMMUNITY): Payer: Medicare HMO

## 2020-02-23 ENCOUNTER — Encounter (HOSPITAL_COMMUNITY): Payer: Self-pay

## 2020-02-23 ENCOUNTER — Other Ambulatory Visit: Payer: Self-pay

## 2020-02-23 ENCOUNTER — Other Ambulatory Visit (HOSPITAL_COMMUNITY): Payer: Medicare HMO

## 2020-02-23 DIAGNOSIS — I160 Hypertensive urgency: Secondary | ICD-10-CM | POA: Diagnosis present

## 2020-02-23 DIAGNOSIS — I10 Essential (primary) hypertension: Secondary | ICD-10-CM | POA: Diagnosis present

## 2020-02-23 DIAGNOSIS — M810 Age-related osteoporosis without current pathological fracture: Secondary | ICD-10-CM | POA: Diagnosis present

## 2020-02-23 DIAGNOSIS — W19XXXA Unspecified fall, initial encounter: Secondary | ICD-10-CM

## 2020-02-23 DIAGNOSIS — I252 Old myocardial infarction: Secondary | ICD-10-CM

## 2020-02-23 DIAGNOSIS — D509 Iron deficiency anemia, unspecified: Secondary | ICD-10-CM | POA: Diagnosis present

## 2020-02-23 DIAGNOSIS — S36030A Superficial (capsular) laceration of spleen, initial encounter: Secondary | ICD-10-CM | POA: Diagnosis not present

## 2020-02-23 DIAGNOSIS — Y92099 Unspecified place in other non-institutional residence as the place of occurrence of the external cause: Secondary | ICD-10-CM

## 2020-02-23 DIAGNOSIS — S36039A Unspecified laceration of spleen, initial encounter: Secondary | ICD-10-CM

## 2020-02-23 DIAGNOSIS — Z20822 Contact with and (suspected) exposure to covid-19: Secondary | ICD-10-CM | POA: Diagnosis present

## 2020-02-23 DIAGNOSIS — I110 Hypertensive heart disease with heart failure: Secondary | ICD-10-CM | POA: Diagnosis present

## 2020-02-23 DIAGNOSIS — Z800 Family history of malignant neoplasm of digestive organs: Secondary | ICD-10-CM

## 2020-02-23 DIAGNOSIS — Z7982 Long term (current) use of aspirin: Secondary | ICD-10-CM

## 2020-02-23 DIAGNOSIS — F0390 Unspecified dementia without behavioral disturbance: Secondary | ICD-10-CM | POA: Diagnosis not present

## 2020-02-23 DIAGNOSIS — R7302 Impaired glucose tolerance (oral): Secondary | ICD-10-CM | POA: Diagnosis present

## 2020-02-23 DIAGNOSIS — E089 Diabetes mellitus due to underlying condition without complications: Secondary | ICD-10-CM | POA: Diagnosis present

## 2020-02-23 DIAGNOSIS — Z8249 Family history of ischemic heart disease and other diseases of the circulatory system: Secondary | ICD-10-CM

## 2020-02-23 DIAGNOSIS — E7800 Pure hypercholesterolemia, unspecified: Secondary | ICD-10-CM | POA: Diagnosis present

## 2020-02-23 DIAGNOSIS — I480 Paroxysmal atrial fibrillation: Secondary | ICD-10-CM | POA: Diagnosis present

## 2020-02-23 DIAGNOSIS — Z79899 Other long term (current) drug therapy: Secondary | ICD-10-CM

## 2020-02-23 DIAGNOSIS — Z823 Family history of stroke: Secondary | ICD-10-CM

## 2020-02-23 DIAGNOSIS — W06XXXA Fall from bed, initial encounter: Secondary | ICD-10-CM | POA: Diagnosis present

## 2020-02-23 DIAGNOSIS — I5042 Chronic combined systolic (congestive) and diastolic (congestive) heart failure: Secondary | ICD-10-CM | POA: Diagnosis present

## 2020-02-23 DIAGNOSIS — Z7983 Long term (current) use of bisphosphonates: Secondary | ICD-10-CM

## 2020-02-23 DIAGNOSIS — R296 Repeated falls: Secondary | ICD-10-CM | POA: Diagnosis present

## 2020-02-23 DIAGNOSIS — I255 Ischemic cardiomyopathy: Secondary | ICD-10-CM | POA: Diagnosis present

## 2020-02-23 DIAGNOSIS — Z8673 Personal history of transient ischemic attack (TIA), and cerebral infarction without residual deficits: Secondary | ICD-10-CM

## 2020-02-23 DIAGNOSIS — Y92009 Unspecified place in unspecified non-institutional (private) residence as the place of occurrence of the external cause: Secondary | ICD-10-CM

## 2020-02-23 DIAGNOSIS — Z66 Do not resuscitate: Secondary | ICD-10-CM | POA: Diagnosis present

## 2020-02-23 DIAGNOSIS — I214 Non-ST elevation (NSTEMI) myocardial infarction: Secondary | ICD-10-CM | POA: Diagnosis present

## 2020-02-23 DIAGNOSIS — Z8679 Personal history of other diseases of the circulatory system: Secondary | ICD-10-CM

## 2020-02-23 DIAGNOSIS — S2232XA Fracture of one rib, left side, initial encounter for closed fracture: Secondary | ICD-10-CM | POA: Diagnosis present

## 2020-02-23 HISTORY — DX: Anemia, unspecified: D64.9

## 2020-02-23 LAB — BASIC METABOLIC PANEL
Anion gap: 9 (ref 5–15)
BUN: 11 mg/dL (ref 8–23)
CO2: 28 mmol/L (ref 22–32)
Calcium: 9.1 mg/dL (ref 8.9–10.3)
Chloride: 103 mmol/L (ref 98–111)
Creatinine, Ser: 0.58 mg/dL (ref 0.44–1.00)
GFR calc Af Amer: >60 mL/min (ref >60)
GFR calc non Af Amer: >60 mL/min (ref >60)
Glucose, Bld: 147 mg/dL — ABNORMAL HIGH (ref 70–99)
Potassium: 4.1 mmol/L (ref 3.5–5.1)
Sodium: 140 mmol/L (ref 135–145)

## 2020-02-23 LAB — CBC WITH DIFFERENTIAL/PLATELET
Abs Immature Granulocytes: 0.03 10*3/uL (ref 0.00–0.07)
Basophils Absolute: 0 10*3/uL (ref 0.0–0.1)
Basophils Relative: 0 %
Eosinophils Absolute: 0.3 10*3/uL (ref 0.0–0.5)
Eosinophils Relative: 3 %
HCT: 34.7 % — ABNORMAL LOW (ref 36.0–46.0)
Hemoglobin: 11 g/dL — ABNORMAL LOW (ref 12.0–15.0)
Immature Granulocytes: 0 %
Lymphocytes Relative: 37 %
Lymphs Abs: 3 10*3/uL (ref 0.7–4.0)
MCH: 22.8 pg — ABNORMAL LOW (ref 26.0–34.0)
MCHC: 31.7 g/dL (ref 30.0–36.0)
MCV: 71.8 fL — ABNORMAL LOW (ref 80.0–100.0)
Monocytes Absolute: 0.9 10*3/uL (ref 0.1–1.0)
Monocytes Relative: 11 %
Neutro Abs: 4 10*3/uL (ref 1.7–7.7)
Neutrophils Relative %: 49 %
Platelets: 242 10*3/uL (ref 150–400)
RBC: 4.83 MIL/uL (ref 3.87–5.11)
RDW: 19.6 % — ABNORMAL HIGH (ref 11.5–15.5)
WBC: 8.2 10*3/uL (ref 4.0–10.5)
nRBC: 0 % (ref 0.0–0.2)

## 2020-02-23 LAB — LIPASE, BLOOD: Lipase: 21 U/L (ref 11–51)

## 2020-02-23 LAB — TROPONIN I (HIGH SENSITIVITY)
Troponin I (High Sensitivity): 7 ng/L (ref <18)
Troponin I (High Sensitivity): 14 ng/L (ref <18)

## 2020-02-23 MED ORDER — IOHEXOL 300 MG/ML  SOLN
100.0000 mL | Freq: Once | INTRAMUSCULAR | Status: AC | PRN
  Administered 2020-02-23: 100 mL via INTRAVENOUS

## 2020-02-23 MED ORDER — ACETAMINOPHEN 325 MG PO TABS: 650.0000 mg | ORAL_TABLET | Freq: Four times a day (QID) | ORAL | Status: DC | PRN

## 2020-02-23 MED ORDER — ACETAMINOPHEN 650 MG RE SUPP: 650.0000 mg | Freq: Four times a day (QID) | RECTAL | Status: DC | PRN

## 2020-02-23 MED ORDER — ENOXAPARIN SODIUM 40 MG/0.4ML ~~LOC~~ SOLN
40.0000 mg | Freq: Every day | SUBCUTANEOUS | Status: DC
  Administered 2020-02-24 (×2): 40 mg via SUBCUTANEOUS
  Filled 2020-02-23 (×2): qty 0.4

## 2020-02-23 MED ORDER — ISOSORBIDE MONONITRATE ER 30 MG PO TB24
15.0000 mg | ORAL_TABLET | Freq: Once | ORAL | Status: AC
  Administered 2020-02-24: 15 mg via ORAL
  Filled 2020-02-23 (×2): qty 1

## 2020-02-23 MED ORDER — OXYCODONE HCL 5 MG PO TABS: 5.0000 mg | ORAL_TABLET | ORAL | Status: DC | PRN

## 2020-02-23 MED ORDER — FENTANYL CITRATE (PF) 100 MCG/2ML IJ SOLN
50.0000 ug | Freq: Once | INTRAMUSCULAR | Status: AC
  Administered 2020-02-24: 50 ug via INTRAVENOUS
  Filled 2020-02-23: qty 2

## 2020-02-23 NOTE — H&P (Signed)
History and Physical  SWAN FAIRFAX ZOX:[id number] DOB: [birth year] DOA: 02/23/2020  Referring physician: Davonna Belling, MD PCP: Tilda Burrow, NP  Outpatient Specialists: cardiology Domenic Polite, Aloha Gell, MD) Patient coming from: [facility]   Chief Complaint: Elevated blood pressure  HPI: [name] is a 86 y.o. female with medical history significant for atrial fibrillation not on anticoagulation, dementia, diabetes mellitus type 2, hypertension, hyperlipidemia and osteoporosis who was brought to the ED via EMS from [facility] assisted facility due to elevated BP.  Patient was unable to provide details regarding why she came to the ED, history was obtained from son at bedside.  Per report, patient was reported to have a fall few days ago, but son clarified that she slid out of bed while sitting on the bed and fall was not from a standing position.  Blood pressure was noted to be elevated with systolic in the 811B at the living facility, EMS was then activated and patient was taken to the ED for further evaluation and management.  Patient denies any fever, chest pain or shortness of breath.  She was recently admitted here from 5/3-5/6 due to NSTEMI and from 7/15-7/18 due to acute CVA.  ED Course:  In the emergency department, BP was elevated at 201/102, microcytic anemia, and hyperglycemia.  CT chest, abdomen and pelvis with contrast showed subacute appearing fracture of the posterior sixth rib on the left, a new small wedge-shaped hypoattenuating defect in the lower pole of spleen which may represent a low-grade splenic laceration in the setting of trauma or splenic infarct was noted.  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  General surgery (Dr. Arnoldo Morale) was consulted by ED physician and recommended admitting patient to [facility] and to monitor patient's hemoglobin.  Hospitalist was asked to  admit.  For further evaluation and management.  Review of Systems: Constitutional: Negative for chills and fever.  HENT: Negative for ear pain and sore throat.   Eyes: Negative for pain and visual disturbance.  Respiratory: Negative for cough, chest tightness and shortness of breath.   Cardiovascular: Negative for chest pain and palpitations.  Gastrointestinal: Negative for abdominal pain and vomiting.  Endocrine: Negative for polyphagia and polyuria.  Genitourinary: Negative for decreased urine volume, dysuria Musculoskeletal: Positive for tender left-sided rib cage.  Skin: Negative for color change and rash.  Allergic/Immunologic: Negative for immunocompromised state.  Neurological: Negative for tremors, syncope, speech difficulty, weakness, light-headedness and headaches.  Hematological: Does not bruise/bleed easily.  All other systems reviewed and are negative   Past Medical History:  Diagnosis Date   Anemia    Atrial fibrillation ([place])    Cardiomyopathy ([place])    a. 10/2019: EF 35-40% by echo --> medical management recommended.    Dementia ([place])    Dementia ([place])    Diverticulitis 8/09, 3/10   Abscess in 2009 (medical tx)   Essential hypertension    Hyperlipidemia    Hyperlipidemia    Hyperthyroidism    Radiation, surgery   IDA (iron deficiency anemia)    Osteoporosis    Postoperative anemia due to acute blood loss 09/08/2016   Thalassemia    Tubular adenoma of colon 12/15/08   Colonosocpy Dr Tonita Cong removed cecum   Type 2 diabetes mellitus ([place])    Past Surgical History:  Procedure Laterality Date   CESAREAN SECTION     x2   CHOLECYSTECTOMY     FOOT SURGERY  bilat   INTRAMEDULLARY (IM) NAIL INTERTROCHANTERIC Left 09/06/2016   Procedure: LEFT HIP INTRAMEDULLARY (IM) NAIL;  Surgeon: Renette Butters, MD;  Location: [facility];  Service: Orthopedics;  Laterality: Left;   THYROIDECTOMY, PARTIAL      Social History:  reports that she has never  smoked. She has never used smokeless tobacco. She reports that she does not drink alcohol and does not use drugs.   Allergies  Allergen Reactions   Bee Venom     Listed per Nursing Center-No reaction is noted   Carbapenems     Listed per Nursing Center-No reaction is noted   Cephalosporins     Listed per Nursing Center-No reaction is noted   Procaine Hcl Other (See Comments)    Numbness/jerkiness   Sulfonamide Derivatives Other (See Comments)    hypertension   Penicillins Rash    Family History  Problem Relation Age of Onset   Pneumonia Father    Coronary artery disease Mother    Stroke Mother    Colon cancer Mother 42   Thalassemia Sister     Prior to Admission medications   Medication Sig Start Date End Date Taking? Authorizing Provider  acetaminophen (TYLENOL) 325 MG tablet Take 2 tablets (650 mg total) by mouth every 6 (six) hours as needed for mild pain, fever or headache. 11/18/19   Roxan Hockey, MD  alendronate (FOSAMAX) 70 MG tablet Take 70 mg by mouth every Wednesday. Take with a full glass of water on an empty stomach.    [provider]  aspirin EC 325 MG EC tablet Take 1 tablet (325 mg total) by mouth daily. 01/31/20   Elgergawy, Silver Huguenin, MD  calcium citrate-vitamin D 500-400 MG-UNIT chewable tablet Chew by mouth 2 (two) times daily.    [provider]  carvedilol (COREG) 6.25 MG tablet Take 1 tablet (6.25 mg total) by mouth 2 (two) times daily with a meal. 11/18/19   Emokpae, Courage, MD  Cranberry 450 MG TABS Take 450 mg by mouth 2 (two) times daily.    [provider]  cycloSPORINE (RESTASIS) 0.05 % ophthalmic emulsion Place 1 drop into both eyes 2 (two) times daily.    [provider]  donepezil (ARICEPT) 5 MG tablet Take 1 tablet (5 mg total) by mouth at bedtime. 11/18/19   Roxan Hockey, MD  ferrous sulfate 325 (65 FE) MG tablet Take 325 mg by mouth in the morning and at bedtime.    [provider]  fish  oil-omega-3 fatty acids 1000 MG capsule Take 2 g by mouth daily.      [provider]  isosorbide mononitrate (IMDUR) 30 MG 24 hr tablet Take 0.5 tablets (15 mg total) by mouth every evening. 11/18/19   Roxan Hockey, MD  lidocaine (LIDODERM) 5 % Place 1 patch onto the skin daily. Remove & Discard patch within 12 hours or as directed by MD Patient not taking: Reported on 01/27/2020 11/12/19   Nuala Alpha A, PA-C  losartan (COZAAR) 50 MG tablet Take 1 tablet (50 mg total) by mouth daily. 11/19/19   Roxan Hockey, MD  meclizine (ANTIVERT) 12.5 MG tablet Take 12.5 mg by mouth every 6 (six) hours as needed for dizziness.    [provider]  memantine (NAMENDA) 10 MG tablet Take 1 tablet (10 mg total) by mouth 2 (two) times daily. 11/18/19   Roxan Hockey, MD  nortriptyline (PAMELOR) 25 MG capsule Take 25 mg by mouth at bedtime.    [provider]  senna-docusate (SENOKOT-S) 8.6-50 MG tablet Take 2 tablets by mouth at bedtime. 11/18/19 11/17/20  Roxan Hockey, MD  simvastatin (ZOCOR) 40 MG tablet Take 1 tablet (40 mg total) by mouth every evening. 11/18/19   Roxan Hockey, MD  spironolactone (ALDACTONE) 25 MG tablet Take 0.5 tablets (12.5 mg total) by mouth daily. 11/19/19   Roxan Hockey, MD  tamsulosin (FLOMAX) 0.4 MG CAPS capsule Take 1 capsule (0.4 mg total) by mouth daily after supper. 11/18/19   Roxan Hockey, MD    Physical Exam: BP 91/63    Pulse 69    Temp (!) 97.5 F (36.4 C) (Oral)    Resp 18    Ht 5\' 2"  (1.575 m)    Wt 68 kg    SpO2 91%    BMI 27.44 kg/m    General: 86 y.o. year-old female well developed well nourished in no acute distress.  Alert and oriented x3.  HEENT: NCAT, EOMI  Neck: Supple, trachea medial  Cardiovascular: Regular rate and rhythm with no rubs or gallops.  No thyromegaly or JVD noted.  No lower extremity edema. 2/4 pulses in all 4 extremities.  Respiratory: Clear to auscultation with no wheezes or rales. Good inspiratory  effort.  Abdomen: Soft. nontender nondistended with normal bowel sounds x4 quadrants.  Muskuloskeletal: Tender to palpation of left-sided 6th rib. No cyanosis, clubbing or edema noted bilaterally  Neuro: CN II-XII intact, strength, sensation, reflexes  Skin: No ulcerative lesions noted or rashes  Psychiatry: Judgement and insight appear normal. Mood is appropriate for condition and setting          Labs on Admission:  Basic Metabolic Panel: Recent Labs  Lab 02/23/20 1830  NA 140  K 4.1  CL 103  CO2 28  GLUCOSE 147*  BUN 11  CREATININE 0.58  CALCIUM 9.1   Liver Function Tests: No results for input(s): AST, ALT, ALKPHOS, BILITOT, PROT, ALBUMIN in the last 168 hours. Recent Labs  Lab 02/23/20 1830  LIPASE 21   No results for input(s): AMMONIA in the last 168 hours. CBC: Recent Labs  Lab 02/23/20 1830  WBC 8.2  NEUTROABS 4.0  HGB 11.0*  HCT 34.7*  MCV 71.8*  PLT 242   Cardiac Enzymes: No results for input(s): CKTOTAL, CKMB, CKMBINDEX, TROPONINI in the last 168 hours.  BNP (last 3 results) No results for input(s): BNP in the last 8760 hours.  ProBNP (last 3 results) No results for input(s): PROBNP in the last 8760 hours.  CBG: No results for input(s): GLUCAP in the last 168 hours.  Radiological Exams on Admission: DG Chest 2 View  Result Date: 02/23/2020 CLINICAL DATA:  86 year old female with chest pain, left side ribbon axillary pain. Recent fall. EXAM: CHEST - 2 VIEW COMPARISON:  Chest CT 11/15/2019 and earlier. FINDINGS: Semi upright AP and lateral views of the chest. Stable cardiomegaly. Other mediastinal contours are within normal limits. Visualized tracheal air column is within normal limits. Mild bilateral increased pulmonary interstitial markings are stable. No pneumothorax, pleural effusion or acute pulmonary opacity. Negative visible bowel gas pattern. Stable cholecystectomy clips. Abdominal Calcified aortic atherosclerosis. Chronic L1 compression  fracture. No displaced left rib fracture identified. Stable visualized osseous structures. Asymmetric degeneration of the right AC joint. IMPRESSION: 1. No acute cardiopulmonary abnormality. No displaced left rib fracture identified. 2. Chronic cardiomegaly and pulmonary interstitial changes. Electronically Signed   By: Genevie Ann M.D.   On: 02/23/2020 19:19   CT Chest W Contrast  Result Date: 02/23/2020 CLINICAL DATA:  Rib fracture suspected.  Left-sided rib pain. EXAM: CT CHEST, ABDOMEN, AND PELVIS WITH CONTRAST TECHNIQUE: Multidetector CT imaging of the chest, abdomen and pelvis was performed following the standard protocol during bolus administration of intravenous contrast. CONTRAST:  173mL OMNIPAQUE IOHEXOL 300 MG/ML  SOLN COMPARISON:  CT dated Nov 16, 2019 and Nov 15, 2019 FINDINGS: CT CHEST FINDINGS Cardiovascular: There is cardiomegaly. There is no evidence for thoracic aortic dissection. Atherosclerotic changes are noted of the thoracic aorta. There is no significant pericardial effusion. The arch vessels are patent. Mediastinum/Nodes: -- No mediastinal lymphadenopathy. -- No hilar lymphadenopathy. -- No axillary lymphadenopathy. -- No supraclavicular lymphadenopathy. --the patient is status post right hemithyroidectomy. The visualized portions of the left thyroid gland are unremarkable. -  Unremarkable esophagus. Lungs/Pleura: There is a stable opacity at the right lung apex. There is no pneumothorax. There is a somewhat mosaic appearance of the lung parenchyma bilaterally. There is atelectasis at the lung bases. There are areas of scarring at the lung bases. Musculoskeletal: There is a subacute appearing fracture of the posterior sixth rib on the left, new since Nov 15, 2019 there is no evidence for an acute displaced left-sided rib fracture. CT ABDOMEN PELVIS FINDINGS Hepatobiliary: The liver is normal. Status post cholecystectomy.There is no biliary ductal dilation. Pancreas: Normal contours without  ductal dilatation. No peripancreatic fluid collection. Spleen: There is a new small wedge-shaped hypoattenuating defect in the lower pole of the spleen (axial series 2, image 57). There is no perisplenic hematoma. Adrenals/Urinary Tract: --Adrenal glands: Unremarkable. --Right kidney/ureter: No hydronephrosis or radiopaque kidney stones. --Left kidney/ureter: No hydronephrosis or radiopaque kidney stones. --Urinary bladder: The urinary bladder is significantly distended. There are few pockets of gas within the urinary bladder. Stomach/Bowel: --Stomach/Duodenum: No hiatal hernia or other gastric abnormality. Normal duodenal course and caliber. --Small bowel: Unremarkable. --Colon: Rectosigmoid diverticulosis without acute inflammation. --Appendix: Normal. Vascular/Lymphatic: Atherosclerotic calcification is present within the non-aneurysmal abdominal aorta, without hemodynamically significant stenosis. 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 There is a mild-to-moderate long segment stenosis of the proximal SMA. The IMA is patent. --No retroperitoneal lymphadenopathy. --No mesenteric lymphadenopathy. --No pelvic or inguinal lymphadenopathy. Reproductive: Unremarkable Other: No ascites or free air. The abdominal wall is normal. Musculoskeletal. There is a stable compression fracture of the L1 vertebral body. No new acute displaced lumbar fracture identified on today's study. IMPRESSION: 1. Subacute appearing fracture of the posterior sixth rib on the left, new since Nov 15, 2019. No pneumothorax. 2. There is a new small wedge-shaped hypoattenuating defect in the lower pole of the spleen. This is new since prior CT in May 2021 and may represent a low-grade splenic laceration in the setting of trauma or a splenic infarct. There is no significant perisplenic hematoma. 3. 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 There is a mild-to-moderate long segment stenosis of the proximal SMA. The  IMA is patent. 4. The urinary bladder is significantly distended. There are few pockets of gas within the urinary bladder. Correlate with recent instrumentation. 5. Cardiomegaly. 6. Mosaic appearance of the lung parenchyma which is nonspecific and can be seen with small airway disease or chronic pulmonary hypertension. Aortic Atherosclerosis (ICD10-I70.0). Electronically Signed   By: Constance Holster M.D.   On: 02/23/2020 21:22   CT ABDOMEN PELVIS W CONTRAST  Result Date: 02/23/2020 CLINICAL DATA:  Rib fracture suspected.  Left-sided rib pain. EXAM: CT CHEST, ABDOMEN, AND PELVIS WITH CONTRAST TECHNIQUE: Multidetector CT imaging of the chest, abdomen and  pelvis was performed following the standard protocol during bolus administration of intravenous contrast. CONTRAST:  131mL OMNIPAQUE IOHEXOL 300 MG/ML  SOLN COMPARISON:  CT dated Nov 16, 2019 and Nov 15, 2019 FINDINGS: CT CHEST FINDINGS Cardiovascular: There is cardiomegaly. There is no evidence for thoracic aortic dissection. Atherosclerotic changes are noted of the thoracic aorta. There is no significant pericardial effusion. The arch vessels are patent. Mediastinum/Nodes: -- No mediastinal lymphadenopathy. -- No hilar lymphadenopathy. -- No axillary lymphadenopathy. -- No supraclavicular lymphadenopathy. --the patient is status post right hemithyroidectomy. The visualized portions of the left thyroid gland are unremarkable. -  Unremarkable esophagus. Lungs/Pleura: There is a stable opacity at the right lung apex. There is no pneumothorax. There is a somewhat mosaic appearance of the lung parenchyma bilaterally. There is atelectasis at the lung bases. There are areas of scarring at the lung bases. Musculoskeletal: There is a subacute appearing fracture of the posterior sixth rib on the left, new since Nov 15, 2019 there is no evidence for an acute displaced left-sided rib fracture. CT ABDOMEN PELVIS FINDINGS Hepatobiliary: The liver is normal. Status post  cholecystectomy.There is no biliary ductal dilation. Pancreas: Normal contours without ductal dilatation. No peripancreatic fluid collection. Spleen: There is a new small wedge-shaped hypoattenuating defect in the lower pole of the spleen (axial series 2, image 57). There is no perisplenic hematoma. Adrenals/Urinary Tract: --Adrenal glands: Unremarkable. --Right kidney/ureter: No hydronephrosis or radiopaque kidney stones. --Left kidney/ureter: No hydronephrosis or radiopaque kidney stones. --Urinary bladder: The urinary bladder is significantly distended. There are few pockets of gas within the urinary bladder. Stomach/Bowel: --Stomach/Duodenum: No hiatal hernia or other gastric abnormality. Normal duodenal course and caliber. --Small bowel: Unremarkable. --Colon: Rectosigmoid diverticulosis without acute inflammation. --Appendix: Normal. Vascular/Lymphatic: Atherosclerotic calcification is present within the non-aneurysmal abdominal aorta, without hemodynamically significant stenosis. 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 There is a mild-to-moderate long segment stenosis of the proximal SMA. The IMA is patent. --No retroperitoneal lymphadenopathy. --No mesenteric lymphadenopathy. --No pelvic or inguinal lymphadenopathy. Reproductive: Unremarkable Other: No ascites or free air. The abdominal wall is normal. Musculoskeletal. There is a stable compression fracture of the L1 vertebral body. No new acute displaced lumbar fracture identified on today's study. IMPRESSION: 1. Subacute appearing fracture of the posterior sixth rib on the left, new since Nov 15, 2019. No pneumothorax. 2. There is a new small wedge-shaped hypoattenuating defect in the lower pole of the spleen. This is new since prior CT in May 2021 and may represent a low-grade splenic laceration in the setting of trauma or a splenic infarct. There is no significant perisplenic hematoma. 3. There is a high-grade stenosis of the proximal  celiac axis. There is a mild-to-moderate long segment stenosis of the proximal SMA. The IMA is patent. 4. The urinary bladder is significantly distended. There are few pockets of gas within the urinary bladder. Correlate with recent instrumentation. 5. Cardiomegaly. 6. Mosaic appearance of the lung parenchyma which is nonspecific and can be seen with small airway disease or chronic pulmonary hypertension. Aortic Atherosclerosis (ICD10-I70.0). Electronically Signed   By: Constance Holster M.D.   On: 02/23/2020 21:22    EKG: I independently viewed the EKG done and my findings are as followed: Normal sinus rhythm at a rate of 74 bpm  Assessment/Plan Present on Admission:  Hypertensive urgency  Essential hypertension  Diabetes mellitus due to underlying condition, controlled ([place])  HYPERCHOLESTEROLEMIA  NSTEMI (non-ST elevated myocardial infarction) ([place])  Dementia ([place])  Principal Problem:  Hypertensive urgency Active Problems:   Diabetes mellitus due to underlying condition, controlled ([place])   HYPERCHOLESTEROLEMIA   Essential hypertension   Dementia ([place])   NSTEMI (non-ST elevated myocardial infarction) ([place])   History of CVA (cerebrovascular accident)   Fall at home, initial encounter   Splenic laceration   History of atrial fibrillation  Hypertensive urgency-resolved Essential hypertension (controlled) IV fentanyl 15mcg and isosorbide mononitrate 50 mg p.o. x1 was given BP currently soft, hold BP meds at this time  Mechanical fall Patient denies fall from a standing position, she states that she slid out of bed to the floor Continue fall precaution and neurochecks Continue PT/OT eval and treat  Splenic laceration  CT abdomen pelvis showed  a new small wedge-shaped hypoattenuating defect in the lower pole of spleen which may represent a low-grade splenic laceration in the setting of trauma or splenic infarct  H/H will be checked in the morning General surgery (Dr.  Arnoldo Morale) was consulted by ED physician and will see patient in the morning, we shall await further recommendation.  Hyperglycemia possibly reactive  Blood glucose 147, continue to monitor blood glucose with morning labs  Hypercholesterolemia Continue Zocor 40 mg p.o. daily  History of NSTEMI  Continue aspirin  325 mg daily, Zocor 40 mg daily, Coreg 6.25 mg twice daily Cozaar 50 mg daily,  Aldactone 12.5 mg daily and isosorbide 15 mg daily  will be temporarily held due to soft BP at this time   History of atrial fibrillation Currently rate control and per EKG sinus rhythm. Continue beta-blocker for rate management Patient is not on anticoagulation due to to high fall risk. CHADsVASC score 5-6, not a candidate for anticoagulation  Chronic systolic CHF Last LVEF (02/16/4626) was 35-40% Patient continues to follow-up with cardiology as an outpatient.  History of CVA Continue aspirin 325 1 mg p.o. daily Continue Zocor 40 mg p.o. daily  Dementia Continue Aricept and Namenda Patient is currently with no behavioral disturbances Continue supportive care  DVT prophylaxis: Lovenox  Code Status: DNR  Family Communication: Son at bedside (all questions answered to satisfaction)  Disposition Plan:  Patient is from:                        home Anticipated DC to:                   SNF or family members home Anticipated DC date:               2-3 days Anticipated DC barriers:          Patient unstable to discharge at this time due to splenic laceration by CT abdomen and pelvis.  Pending surgical evaluation in the morning.   Consults called: General surgery  Admission status: Observation    Bernadette Hoit MD [facility]  If 7PM-7AM, please contact night-coverage [email or web address]  02/24/2020, 2:56 AM

## 2020-02-23 NOTE — ED Provider Notes (Signed)
[facility] Provider Note   CSN: [id number] Arrival date & time: 02/23/20  1813     History No chief complaint on file.   [name] is a 86 y.o. female. Level 5 caveat due to dementia. HPI Patient presents with left-sided chest pain and high blood pressure.  Reportedly had a fall a few days ago and hit the left side of her chest.  Tender on the left chest with palpation.  However reportedly had blood pressures of 428 systolic at the nursing home.  No fevers no difficulty breathing.  Recent admission for non-STEMI due to high blood pressure.    Past Medical History:  Diagnosis Date   Anemia    Atrial fibrillation ([place])    Cardiomyopathy ([place])    a. 10/2019: EF 35-40% by echo --> medical management recommended.    Dementia ([place])    Dementia ([place])    Diverticulitis 8/09, 3/10   Abscess in 2009 (medical tx)   Essential hypertension    Hyperlipidemia    Hyperlipidemia    Hyperthyroidism    Radiation, surgery   IDA (iron deficiency anemia)    Osteoporosis    Postoperative anemia due to acute blood loss 09/08/2016   Thalassemia    Tubular adenoma of colon 12/15/08   Colonosocpy Dr Tonita Cong removed cecum   Type 2 diabetes [facility])     Patient Active Problem List   Diagnosis Date Noted   Microcytic anemia 01/28/2020   Acute lower UTI 01/28/2020   Acute CVA (cerebrovascular accident) ([place]) 01/27/2020   Cardiomyopathy ([place]) 11/17/2019   Ascending aortic aneurysm ([place]) 11/17/2019   NSTEMI (non-ST elevated myocardial infarction) ([place]) 11/16/2019   Chest pain 11/15/2019   Postoperative anemia due to acute blood loss 09/08/2016   Closed intertrochanteric fracture of left hip, initial encounter ([place]) 09/05/2016   Dementia ([place]) 09/05/2016   Abdominal pain 01/10/2011   DERANGEMENT OF POSTERIOR HORN OF MEDIAL MENISCUS 09/25/2010   CLOSED DISLOCATION OF DISTAL RADIOULNAR 03/28/2010   COLONIC POLYPS, ADENOMATOUS, HX  OF 07/31/2009   PAROXYSMAL ATRIAL FIBRILLATION 10/26/2008   Deficiency anemia 10/25/2008   OTHER THALASSEMIA 10/25/2008   DIVERTICULITIS, COLON 10/25/2008   Diabetes mellitus due to underlying condition, controlled ([place]) 10/24/2008   HYPERCHOLESTEROLEMIA 10/24/2008   Iron deficiency anemia 10/24/2008   Essential hypertension 10/24/2008   Osteoporosis 10/24/2008   HYPERTHYROIDISM, HX OF 10/24/2008    Past Surgical History:  Procedure Laterality Date   CESAREAN SECTION     x2   CHOLECYSTECTOMY     FOOT SURGERY     bilat   INTRAMEDULLARY (IM) NAIL INTERTROCHANTERIC Left 09/06/2016   Procedure: LEFT HIP INTRAMEDULLARY (IM) NAIL;  Surgeon: Renette Butters, MD;  Location: [facility];  Service: Orthopedics;  Laterality: Left;   THYROIDECTOMY, PARTIAL       OB History   No obstetric history on file.     Family History  Problem Relation Age of Onset   Pneumonia Father    Coronary artery disease Mother    Stroke Mother    Colon cancer Mother 72   Thalassemia Sister     Social History   Tobacco Use   Smoking status: Never Smoker   Smokeless tobacco: Never Used  Substance Use Topics   Alcohol use: Never   Drug use: No    Home Medications Prior to Admission medications   Medication Sig Start Date End Date Taking? Authorizing Provider  acetaminophen (TYLENOL) 325 MG tablet Take 2 tablets (650 mg total) by  mouth every 6 (six) hours as needed for mild pain, fever or headache. 11/18/19   Roxan Hockey, MD  alendronate (FOSAMAX) 70 MG tablet Take 70 mg by mouth every Wednesday. Take with a full glass of water on an empty stomach.    [provider]  aspirin EC 325 MG EC tablet Take 1 tablet (325 mg total) by mouth daily. 01/31/20   Elgergawy, Silver Huguenin, MD  calcium citrate-vitamin D 500-400 MG-UNIT chewable tablet Chew by mouth 2 (two) times daily.    [provider]  carvedilol (COREG) 6.25 MG tablet Take 1 tablet (6.25 mg total) by mouth 2  (two) times daily with a meal. 11/18/19   Emokpae, Courage, MD  Cranberry 450 MG TABS Take 450 mg by mouth 2 (two) times daily.    [provider]  cycloSPORINE (RESTASIS) 0.05 % ophthalmic emulsion Place 1 drop into both eyes 2 (two) times daily.    [provider]  donepezil (ARICEPT) 5 MG tablet Take 1 tablet (5 mg total) by mouth at bedtime. 11/18/19   Roxan Hockey, MD  ferrous sulfate 325 (65 FE) MG tablet Take 325 mg by mouth in the morning and at bedtime.    [provider]  fish oil-omega-3 fatty acids 1000 MG capsule Take 2 g by mouth daily.      [provider]  isosorbide mononitrate (IMDUR) 30 MG 24 hr tablet Take 0.5 tablets (15 mg total) by mouth every evening. 11/18/19   Roxan Hockey, MD  lidocaine (LIDODERM) 5 % Place 1 patch onto the skin daily. Remove & Discard patch within 12 hours or as directed by MD Patient not taking: Reported on 01/27/2020 11/12/19   Nuala Alpha A, PA-C  losartan (COZAAR) 50 MG tablet Take 1 tablet (50 mg total) by mouth daily. 11/19/19   Roxan Hockey, MD  meclizine (ANTIVERT) 12.5 MG tablet Take 12.5 mg by mouth every 6 (six) hours as needed for dizziness.    [provider]  memantine (NAMENDA) 10 MG tablet Take 1 tablet (10 mg total) by mouth 2 (two) times daily. 11/18/19   Roxan Hockey, MD  nortriptyline (PAMELOR) 25 MG capsule Take 25 mg by mouth at bedtime.    [provider]  senna-docusate (SENOKOT-S) 8.6-50 MG tablet Take 2 tablets by mouth at bedtime. 11/18/19 11/17/20  Roxan Hockey, MD  simvastatin (ZOCOR) 40 MG tablet Take 1 tablet (40 mg total) by mouth every evening. 11/18/19   Roxan Hockey, MD  spironolactone (ALDACTONE) 25 MG tablet Take 0.5 tablets (12.5 mg total) by mouth daily. 11/19/19   Roxan Hockey, MD  tamsulosin (FLOMAX) 0.4 MG CAPS capsule Take 1 capsule (0.4 mg total) by mouth daily after supper. 11/18/19   Roxan Hockey, MD    Allergies    Bee venom, Carbapenems,  Cephalosporins, Procaine hcl, Sulfonamide derivatives, and Penicillins  Review of Systems   Review of Systems  Unable to perform ROS: Dementia    Physical Exam Updated Vital Signs BP (!) 206/86    Pulse 81    Temp (!) 97.5 F (36.4 C) (Oral)    Resp 18    Ht 5\' 2"  (1.575 m)    Wt 68 kg    SpO2 96%    BMI 27.44 kg/m   Physical Exam Vitals and nursing note reviewed.  Constitutional:      Appearance: Normal appearance.  HENT:     Head: Normocephalic.     Mouth/Throat:     Mouth: Mucous membranes are moist.  Eyes:     Extraocular Movements: Extraocular movements intact.  Cardiovascular:     Rate and Rhythm: Regular rhythm.  Pulmonary:     Effort: No respiratory distress.     Breath sounds: No wheezing or rhonchi.     Comments: Tenderness to left anterior lower chest wall.  No crepitance or deformity.  No subcu emphysema. Chest:     Chest wall: Tenderness present.  Abdominal:     Tenderness: There is abdominal tenderness.     Comments: Left upper quadrant tenderness without rebound or guarding.  Musculoskeletal:        General: No deformity.     Cervical back: Neck supple.  Skin:    Capillary Refill: Capillary refill takes less than 2 seconds.     Findings: No rash.  Neurological:     Comments: Awake and pleasant but some confusion.     ED Results / Procedures / Treatments   Labs (all labs ordered are listed, but only abnormal results are displayed) Labs Reviewed  CBC WITH DIFFERENTIAL/PLATELET - Abnormal; Notable for the following components:      Result Value   Hemoglobin 11.0 (*)    HCT 34.7 (*)    MCV 71.8 (*)    MCH 22.8 (*)    RDW 19.6 (*)    All other components within normal limits  BASIC METABOLIC PANEL - Abnormal; Notable for the following components:   Glucose, Bld 147 (*)    All other components within normal limits  LIPASE, BLOOD  TROPONIN I (HIGH SENSITIVITY)  TROPONIN I (HIGH SENSITIVITY)    EKG EKG Interpretation  Date/Time:  Wednesday  February 23 2020 19:13:45 EDT Ventricular Rate:  74 PR Interval:  158 QRS Duration: 106 QT Interval:  414 QTC Calculation: 459 R Axis:   -12 Text Interpretation: Normal sinus rhythm Nonspecific ST abnormality Abnormal ECG No significant change since last tracing Confirmed by Davonna Belling [id number]) on 02/23/2020 7:59:19 PM   Radiology DG Chest 2 View  Result Date: 02/23/2020 CLINICAL DATA:  86 year old female with chest pain, left side ribbon axillary pain. Recent fall. EXAM: CHEST - 2 VIEW COMPARISON:  Chest CT 11/15/2019 and earlier. FINDINGS: Semi upright AP and lateral views of the chest. Stable cardiomegaly. Other mediastinal contours are within normal limits. Visualized tracheal air column is within normal limits. Mild bilateral increased pulmonary interstitial markings are stable. No pneumothorax, pleural effusion or acute pulmonary opacity. Negative visible bowel gas pattern. Stable cholecystectomy clips. Abdominal Calcified aortic atherosclerosis. Chronic L1 compression fracture. No displaced left rib fracture identified. Stable visualized osseous structures. Asymmetric degeneration of the right AC joint. IMPRESSION: 1. No acute cardiopulmonary abnormality. No displaced left rib fracture identified. 2. Chronic cardiomegaly and pulmonary interstitial changes. Electronically Signed   By: Genevie Ann M.D.   On: 02/23/2020 19:19   CT Chest W Contrast  Result Date: 02/23/2020 CLINICAL DATA:  Rib fracture suspected.  Left-sided rib pain. EXAM: CT CHEST, ABDOMEN, AND PELVIS WITH CONTRAST TECHNIQUE: Multidetector CT imaging of the chest, abdomen and pelvis was performed following the standard protocol during bolus administration of intravenous contrast. CONTRAST:  179mL OMNIPAQUE IOHEXOL 300 MG/ML  SOLN COMPARISON:  CT dated Nov 16, 2019 and Nov 15, 2019 FINDINGS: CT CHEST FINDINGS Cardiovascular: There is cardiomegaly. There is no evidence for thoracic aortic dissection. Atherosclerotic changes are noted  of the thoracic aorta. There is no significant pericardial effusion. The arch vessels are patent. Mediastinum/Nodes: -- No mediastinal lymphadenopathy. -- No hilar lymphadenopathy. -- No axillary lymphadenopathy. --  No supraclavicular lymphadenopathy. --the patient is status post right hemithyroidectomy. The visualized portions of the left thyroid gland are unremarkable. -  Unremarkable esophagus. Lungs/Pleura: There is a stable opacity at the right lung apex. There is no pneumothorax. There is a somewhat mosaic appearance of the lung parenchyma bilaterally. There is atelectasis at the lung bases. There are areas of scarring at the lung bases. Musculoskeletal: There is a subacute appearing fracture of the posterior sixth rib on the left, new since Nov 15, 2019 there is no evidence for an acute displaced left-sided rib fracture. CT ABDOMEN PELVIS FINDINGS Hepatobiliary: The liver is normal. Status post cholecystectomy.There is no biliary ductal dilation. Pancreas: Normal contours without ductal dilatation. No peripancreatic fluid collection. Spleen: There is a new small wedge-shaped hypoattenuating defect in the lower pole of the spleen (axial series 2, image 57). There is no perisplenic hematoma. Adrenals/Urinary Tract: --Adrenal glands: Unremarkable. --Right kidney/ureter: No hydronephrosis or radiopaque kidney stones. --Left kidney/ureter: No hydronephrosis or radiopaque kidney stones. --Urinary bladder: The urinary bladder is significantly distended. There are few pockets of gas within the urinary bladder. Stomach/Bowel: --Stomach/Duodenum: No hiatal hernia or other gastric abnormality. Normal duodenal course and caliber. --Small bowel: Unremarkable. --Colon: Rectosigmoid diverticulosis without acute inflammation. --Appendix: Normal. Vascular/Lymphatic: Atherosclerotic calcification is present within the non-aneurysmal abdominal aorta, without hemodynamically significant stenosis. There is a high-grade stenosis of  the proximal celiac axis. There is a mild-to-moderate long segment stenosis of the proximal SMA. The IMA is patent. --No retroperitoneal lymphadenopathy. --No mesenteric lymphadenopathy. --No pelvic or inguinal lymphadenopathy. Reproductive: Unremarkable Other: No ascites or free air. The abdominal wall is normal. Musculoskeletal. There is a stable compression fracture of the L1 vertebral body. No new acute displaced lumbar fracture identified on today's study. IMPRESSION: 1. Subacute appearing fracture of the posterior sixth rib on the left, new since Nov 15, 2019. No pneumothorax. 2. There is a new small wedge-shaped hypoattenuating defect in the lower pole of the spleen. This is new since prior CT in May 2021 and may represent a low-grade splenic laceration in the setting of trauma or a splenic infarct. There is no significant perisplenic hematoma. 3. 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 There is a mild-to-moderate long segment stenosis of the proximal SMA. The IMA is patent. 4. The urinary bladder is significantly distended. There are few pockets of gas within the urinary bladder. Correlate with recent instrumentation. 5. Cardiomegaly. 6. Mosaic appearance of the lung parenchyma which is nonspecific and can be seen with small airway disease or chronic pulmonary hypertension. Aortic Atherosclerosis (ICD10-I70.0). Electronically Signed   By: Constance Holster M.D.   On: 02/23/2020 21:22   CT ABDOMEN PELVIS W CONTRAST  Result Date: 02/23/2020 CLINICAL DATA:  Rib fracture suspected.  Left-sided rib pain. EXAM: CT CHEST, ABDOMEN, AND PELVIS WITH CONTRAST TECHNIQUE: Multidetector CT imaging of the chest, abdomen and pelvis was performed following the standard protocol during bolus administration of intravenous contrast. CONTRAST:  11mL OMNIPAQUE IOHEXOL 300 MG/ML  SOLN COMPARISON:  CT dated Nov 16, 2019 and Nov 15, 2019 FINDINGS: CT CHEST FINDINGS Cardiovascular: There is cardiomegaly. There  is no evidence for thoracic aortic dissection. Atherosclerotic changes are noted of the thoracic aorta. There is no significant pericardial effusion. The arch vessels are patent. Mediastinum/Nodes: -- No mediastinal lymphadenopathy. -- No hilar lymphadenopathy. -- No axillary lymphadenopathy. -- No supraclavicular lymphadenopathy. --the patient is status post right hemithyroidectomy. The visualized portions of the left thyroid gland are unremarkable. -  Unremarkable esophagus.  Lungs/Pleura: There is a stable opacity at the right lung apex. There is no pneumothorax. There is a somewhat mosaic appearance of the lung parenchyma bilaterally. There is atelectasis at the lung bases. There are areas of scarring at the lung bases. Musculoskeletal: There is a subacute appearing fracture of the posterior sixth rib on the left, new since Nov 15, 2019 there is no evidence for an acute displaced left-sided rib fracture. CT ABDOMEN PELVIS FINDINGS Hepatobiliary: The liver is normal. Status post cholecystectomy.There is no biliary ductal dilation. Pancreas: Normal contours without ductal dilatation. No peripancreatic fluid collection. Spleen: There is a new small wedge-shaped hypoattenuating defect in the lower pole of the spleen (axial series 2, image 57). There is no perisplenic hematoma. Adrenals/Urinary Tract: --Adrenal glands: Unremarkable. --Right kidney/ureter: No hydronephrosis or radiopaque kidney stones. --Left kidney/ureter: No hydronephrosis or radiopaque kidney stones. --Urinary bladder: The urinary bladder is significantly distended. There are few pockets of gas within the urinary bladder. Stomach/Bowel: --Stomach/Duodenum: No hiatal hernia or other gastric abnormality. Normal duodenal course and caliber. --Small bowel: Unremarkable. --Colon: Rectosigmoid diverticulosis without acute inflammation. --Appendix: Normal. Vascular/Lymphatic: Atherosclerotic calcification is present within the non-aneurysmal abdominal  aorta, without hemodynamically significant stenosis. 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 There is a mild-to-moderate long segment stenosis of the proximal SMA. The IMA is patent. --No retroperitoneal lymphadenopathy. --No mesenteric lymphadenopathy. --No pelvic or inguinal lymphadenopathy. Reproductive: Unremarkable Other: No ascites or free air. The abdominal wall is normal. Musculoskeletal. There is a stable compression fracture of the L1 vertebral body. No new acute displaced lumbar fracture identified on today's study. IMPRESSION: 1. Subacute appearing fracture of the posterior sixth rib on the left, new since Nov 15, 2019. No pneumothorax. 2. There is a new small wedge-shaped hypoattenuating defect in the lower pole of the spleen. This is new since prior CT in May 2021 and may represent a low-grade splenic laceration in the setting of trauma or a splenic infarct. There is no significant perisplenic hematoma. 3. 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 There is a mild-to-moderate long segment stenosis of the proximal SMA. The IMA is patent. 4. The urinary bladder is significantly distended. There are few pockets of gas within the urinary bladder. Correlate with recent instrumentation. 5. Cardiomegaly. 6. Mosaic appearance of the lung parenchyma which is nonspecific and can be seen with small airway disease or chronic pulmonary hypertension. Aortic Atherosclerosis (ICD10-I70.0). Electronically Signed   By: Constance Holster M.D.   On: 02/23/2020 21:22    Procedures Procedures (including critical care time)  Medications Ordered in ED Medications  iohexol (OMNIPAQUE) 300 MG/ML solution 100 mL (100 mLs Intravenous Contrast Given 02/23/20 2055)    ED Course  I have reviewed the triage vital signs and the nursing notes.  Pertinent labs & imaging results that were available during my care of the patient were reviewed by me and considered in my medical decision  making (see chart for details).    MDM Rules/Calculators/A&P                         Patient is poorly sent in for hypertension.  Blood pressure 786 systolic.  Does have left-sided chest and abdominal pain but states she also recently fell.  Tenderness to chest and upper abdomen.  CT scan done and showed potential acute to subacute left posterior rib fracture.  But also shows grade 1 splenic laceration.  No hemorrhage from the spleen.  Unsure of the timeframe of the potential laceration.  However with recent fall potentially could be the cause.  I feel with the hypertension recent non-STEMI due to hypertension and the splenic injury patient benefit from admission to the hospital.  Discussed with Dr. Arnoldo Morale from general surgery.  He feels the best patient can stay at [facility] and does not need transfer.  Likely would only need hemoglobin check to make sure does not decrease.  Will discuss with hospitalist. Final Clinical Impression(s) / ED Diagnoses Final diagnoses:  Fall, initial encounter  Laceration of spleen, initial encounter  Closed fracture of one rib of left side, initial encounter  Hypertension, unspecified type    Rx / DC Orders ED Discharge Orders    None       Davonna Belling, MD 02/23/20 2151

## 2020-02-23 NOTE — ED Triage Notes (Addendum)
Pt resident of [facility].  Pt reports fell a few days ago and c/o left rib pain.  EMS says staff was sending pt out because of htn.  Reports bp was over 200 per staff.  EMS says bp was 193/195, hr 108, o2 sat 95% on room air.  Pt alert, oriented  To place and self.

## 2020-02-24 DIAGNOSIS — R7302 Impaired glucose tolerance (oral): Secondary | ICD-10-CM | POA: Diagnosis present

## 2020-02-24 DIAGNOSIS — Z8673 Personal history of transient ischemic attack (TIA), and cerebral infarction without residual deficits: Secondary | ICD-10-CM | POA: Diagnosis not present

## 2020-02-24 DIAGNOSIS — Z8249 Family history of ischemic heart disease and other diseases of the circulatory system: Secondary | ICD-10-CM | POA: Diagnosis not present

## 2020-02-24 DIAGNOSIS — S36039A Unspecified laceration of spleen, initial encounter: Secondary | ICD-10-CM

## 2020-02-24 DIAGNOSIS — I10 Essential (primary) hypertension: Secondary | ICD-10-CM | POA: Diagnosis not present

## 2020-02-24 DIAGNOSIS — S36030A Superficial (capsular) laceration of spleen, initial encounter: Secondary | ICD-10-CM | POA: Diagnosis present

## 2020-02-24 DIAGNOSIS — Z20822 Contact with and (suspected) exposure to covid-19: Secondary | ICD-10-CM | POA: Diagnosis present

## 2020-02-24 DIAGNOSIS — Z7983 Long term (current) use of bisphosphonates: Secondary | ICD-10-CM | POA: Diagnosis not present

## 2020-02-24 DIAGNOSIS — Z823 Family history of stroke: Secondary | ICD-10-CM | POA: Diagnosis not present

## 2020-02-24 DIAGNOSIS — W19XXXA Unspecified fall, initial encounter: Secondary | ICD-10-CM

## 2020-02-24 DIAGNOSIS — Y92099 Unspecified place in other non-institutional residence as the place of occurrence of the external cause: Secondary | ICD-10-CM | POA: Diagnosis not present

## 2020-02-24 DIAGNOSIS — Z800 Family history of malignant neoplasm of digestive organs: Secondary | ICD-10-CM | POA: Diagnosis not present

## 2020-02-24 DIAGNOSIS — D509 Iron deficiency anemia, unspecified: Secondary | ICD-10-CM | POA: Diagnosis present

## 2020-02-24 DIAGNOSIS — E7800 Pure hypercholesterolemia, unspecified: Secondary | ICD-10-CM | POA: Diagnosis present

## 2020-02-24 DIAGNOSIS — F0390 Unspecified dementia without behavioral disturbance: Secondary | ICD-10-CM | POA: Diagnosis present

## 2020-02-24 DIAGNOSIS — I480 Paroxysmal atrial fibrillation: Secondary | ICD-10-CM | POA: Diagnosis present

## 2020-02-24 DIAGNOSIS — I5042 Chronic combined systolic (congestive) and diastolic (congestive) heart failure: Secondary | ICD-10-CM | POA: Diagnosis present

## 2020-02-24 DIAGNOSIS — Z79899 Other long term (current) drug therapy: Secondary | ICD-10-CM | POA: Diagnosis not present

## 2020-02-24 DIAGNOSIS — M810 Age-related osteoporosis without current pathological fracture: Secondary | ICD-10-CM | POA: Diagnosis present

## 2020-02-24 DIAGNOSIS — I255 Ischemic cardiomyopathy: Secondary | ICD-10-CM | POA: Diagnosis present

## 2020-02-24 DIAGNOSIS — Z8679 Personal history of other diseases of the circulatory system: Secondary | ICD-10-CM

## 2020-02-24 DIAGNOSIS — I252 Old myocardial infarction: Secondary | ICD-10-CM | POA: Diagnosis not present

## 2020-02-24 DIAGNOSIS — Z66 Do not resuscitate: Secondary | ICD-10-CM | POA: Diagnosis present

## 2020-02-24 DIAGNOSIS — S2232XA Fracture of one rib, left side, initial encounter for closed fracture: Secondary | ICD-10-CM | POA: Diagnosis present

## 2020-02-24 DIAGNOSIS — Y92009 Unspecified place in unspecified non-institutional (private) residence as the place of occurrence of the external cause: Secondary | ICD-10-CM

## 2020-02-24 DIAGNOSIS — I160 Hypertensive urgency: Secondary | ICD-10-CM | POA: Diagnosis present

## 2020-02-24 DIAGNOSIS — W06XXXA Fall from bed, initial encounter: Secondary | ICD-10-CM | POA: Diagnosis present

## 2020-02-24 DIAGNOSIS — I110 Hypertensive heart disease with heart failure: Secondary | ICD-10-CM | POA: Diagnosis present

## 2020-02-24 DIAGNOSIS — R296 Repeated falls: Secondary | ICD-10-CM | POA: Diagnosis present

## 2020-02-24 DIAGNOSIS — Z7982 Long term (current) use of aspirin: Secondary | ICD-10-CM | POA: Diagnosis not present

## 2020-02-24 LAB — COMPREHENSIVE METABOLIC PANEL
ALT: 15 U/L (ref 0–44)
AST: 15 U/L (ref 15–41)
Albumin: 3.7 g/dL (ref 3.5–5.0)
Alkaline Phosphatase: 47 U/L (ref 38–126)
Anion gap: 10 (ref 5–15)
BUN: 12 mg/dL (ref 8–23)
CO2: 27 mmol/L (ref 22–32)
Calcium: 9 mg/dL (ref 8.9–10.3)
Chloride: 104 mmol/L (ref 98–111)
Creatinine, Ser: 0.58 mg/dL (ref 0.44–1.00)
GFR calc Af Amer: >60 mL/min (ref >60)
GFR calc non Af Amer: >60 mL/min (ref >60)
Glucose, Bld: 121 mg/dL — ABNORMAL HIGH (ref 70–99)
Potassium: 3.6 mmol/L (ref 3.5–5.1)
Sodium: 141 mmol/L (ref 135–145)
Total Bilirubin: 0.7 mg/dL (ref 0.3–1.2)
Total Protein: 6.2 g/dL — ABNORMAL LOW (ref 6.5–8.1)

## 2020-02-24 LAB — CBC
HCT: 32.2 % — ABNORMAL LOW (ref 36.0–46.0)
Hemoglobin: 10.2 g/dL — ABNORMAL LOW (ref 12.0–15.0)
MCH: 22.8 pg — ABNORMAL LOW (ref 26.0–34.0)
MCHC: 31.7 g/dL (ref 30.0–36.0)
MCV: 71.9 fL — ABNORMAL LOW (ref 80.0–100.0)
Platelets: 227 10*3/uL (ref 150–400)
RBC: 4.48 MIL/uL (ref 3.87–5.11)
RDW: 19.8 % — ABNORMAL HIGH (ref 11.5–15.5)
WBC: 8.6 10*3/uL (ref 4.0–10.5)
nRBC: 0 % (ref 0.0–0.2)

## 2020-02-24 LAB — APTT: aPTT: 25 seconds (ref 24–36)

## 2020-02-24 LAB — PROTIME-INR
INR: 1 (ref 0.8–1.2)
Prothrombin Time: 13 seconds (ref 11.4–15.2)

## 2020-02-24 LAB — PHOSPHORUS: Phosphorus: 3.8 mg/dL (ref 2.5–4.6)

## 2020-02-24 LAB — SARS CORONAVIRUS 2 BY RT PCR (HOSPITAL ORDER, PERFORMED IN ~~LOC~~ HOSPITAL LAB): SARS Coronavirus 2: NEGATIVE

## 2020-02-24 LAB — MAGNESIUM: Magnesium: 2 mg/dL (ref 1.7–2.4)

## 2020-02-24 MED ORDER — CARVEDILOL 3.125 MG PO TABS
6.2500 mg | ORAL_TABLET | Freq: Two times a day (BID) | ORAL | Status: DC
  Administered 2020-02-24: 6.25 mg via ORAL
  Filled 2020-02-24: qty 1

## 2020-02-24 MED ORDER — MEMANTINE HCL 10 MG PO TABS
10.0000 mg | ORAL_TABLET | Freq: Two times a day (BID) | ORAL | Status: DC
  Administered 2020-02-24 – 2020-02-25 (×3): 10 mg via ORAL
  Filled 2020-02-24 (×3): qty 1

## 2020-02-24 MED ORDER — FERROUS SULFATE 325 (65 FE) MG PO TABS
325.0000 mg | ORAL_TABLET | Freq: Two times a day (BID) | ORAL | Status: DC
  Administered 2020-02-24 – 2020-02-25 (×2): 325 mg via ORAL
  Filled 2020-02-24 (×2): qty 1

## 2020-02-24 MED ORDER — SPIRONOLACTONE 25 MG PO TABS
12.5000 mg | ORAL_TABLET | Freq: Every day | ORAL | Status: DC
  Administered 2020-02-24 – 2020-02-25 (×2): 12.5 mg via ORAL
  Filled 2020-02-24: qty 1
  Filled 2020-02-24 (×5): qty 0.5

## 2020-02-24 MED ORDER — CARVEDILOL 3.125 MG PO TABS
6.2500 mg | ORAL_TABLET | Freq: Two times a day (BID) | ORAL | Status: DC
  Administered 2020-02-24 – 2020-02-25 (×2): 6.25 mg via ORAL
  Filled 2020-02-24 (×2): qty 2

## 2020-02-24 MED ORDER — SIMVASTATIN 20 MG PO TABS
40.0000 mg | ORAL_TABLET | Freq: Every evening | ORAL | Status: DC
  Administered 2020-02-24: 40 mg via ORAL
  Filled 2020-02-24: qty 2

## 2020-02-24 MED ORDER — LOSARTAN POTASSIUM 50 MG PO TABS
50.0000 mg | ORAL_TABLET | Freq: Every day | ORAL | Status: DC
  Administered 2020-02-24 – 2020-02-25 (×2): 50 mg via ORAL
  Filled 2020-02-24 (×2): qty 1

## 2020-02-24 MED ORDER — ISOSORBIDE MONONITRATE ER 30 MG PO TB24
15.0000 mg | ORAL_TABLET | Freq: Every day | ORAL | Status: DC
  Administered 2020-02-24 – 2020-02-25 (×2): 15 mg via ORAL
  Filled 2020-02-24: qty 1

## 2020-02-24 MED ORDER — ASPIRIN EC 325 MG PO TBEC
325.0000 mg | DELAYED_RELEASE_TABLET | Freq: Every day | ORAL | Status: DC
  Administered 2020-02-24 – 2020-02-25 (×2): 325 mg via ORAL
  Filled 2020-02-24 (×2): qty 1

## 2020-02-24 MED ORDER — CARVEDILOL 12.5 MG PO TABS: 12.5000 mg | ORAL_TABLET | Freq: Two times a day (BID) | ORAL | Status: DC

## 2020-02-24 MED ORDER — SENNOSIDES-DOCUSATE SODIUM 8.6-50 MG PO TABS
2.0000 | ORAL_TABLET | Freq: Every day | ORAL | Status: DC
  Administered 2020-02-24: 2 via ORAL
  Filled 2020-02-24: qty 2

## 2020-02-24 MED ORDER — OMEGA-3-ACID ETHYL ESTERS 1 G PO CAPS
2.0000 g | ORAL_CAPSULE | Freq: Every day | ORAL | Status: DC
  Administered 2020-02-24 – 2020-02-25 (×2): 2 g via ORAL
  Filled 2020-02-24 (×2): qty 2

## 2020-02-24 MED ORDER — DONEPEZIL HCL 5 MG PO TABS
5.0000 mg | ORAL_TABLET | Freq: Every day | ORAL | Status: DC
  Administered 2020-02-24: 5 mg via ORAL
  Filled 2020-02-24: qty 1

## 2020-02-24 NOTE — Evaluation (Addendum)
Physical Therapy Evaluation Patient Details Name: [name] MRN: [id number] DOB: February 25, 1929 Today's Date: 02/24/2020   History of Present Illness  In the emergency department, BP was elevated at 201/102, microcytic anemia, and hyperglycemia.  CT chest, abdomen and pelvis with contrast showed subacute appearing fracture of the posterior sixth rib on the left, a new small wedge-shaped hypoattenuating defect in the lower pole of spleen which may represent a low-grade splenic laceration in the setting of trauma or splenic infarct was noted.  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  General surgery (Dr. Arnoldo Morale) was consulted by ED physician and recommended admitting patient to [facility] and to monitor patient's hemoglobin.  Hospitalist was asked to admit.  For further evaluation and management.    Clinical Impression  The patient was able to perform sit to/from stand transfer with modified independence, but required min/mod assist when performing supine to sit transfer. This could be due to generalized weakness or pain from the rib fracture site. Her O2 was 93% on room air and denied any feelings of confusion, light headedness, or dizziness. She was able to ambulate 35 feet and realized when she was fatigued to turn around and return to the room. She had decreased awareness of the right side of the walker during ambulation, and required VC's to avoid the wall and other obstacles on the right side. No gross strength imbalances were observed from either side with transfers or ambulation. Patient tolerated sitting up in chair with son present at bedside after therapy. PLAN: The patient will continue to benefit from skilled physical therapy services in hospital at recommended venue below in order to improve balance, gait, and ADL's to promote independence in functional activities.      Follow Up Recommendations Home  health PT    Equipment Recommendations    None recommended by PT   Recommendations for Other Services   None recommended by PT.     Precautions / Restrictions Precautions Precautions: Fall Precaution Comments: history of falls Restrictions Weight Bearing Restrictions: No Other Position/Activity Restrictions: rib fx on L posterior thorax      Mobility  Bed Mobility Overal bed mobility: Needs Assistance Bed Mobility: Supine to Sit     Supine to sit: Min assist     General bed mobility comments: difficult to tell if due to generalized weakness or pain/guarding from rib fracture site  Transfers Overall transfer level: Modified independent Equipment used: Rolling walker (2 wheeled)                Ambulation/Gait Ambulation/Gait assistance: Modified independent (Device/Increase time) Gait Distance (Feet): 35 Feet Assistive device: Rolling walker (2 wheeled) Gait Pattern/deviations: WFL(Within Functional Limits)   Gait velocity interpretation: 1.31 - 2.62 ft/sec, indicative of limited community Conservation officer, historic buildings Rankin (Stroke Patients Only)       Balance Overall balance assessment: Modified Independent                                           Pertinent Vitals/Pain Pain Assessment: Faces Faces Pain Scale: Hurts a little bit Pain Location: L posterior thorax over ribs Pain Descriptors / Indicators: Grimacing;Guarding Pain Intervention(s): Repositioned;Monitored during session    Home Living  Family/patient expects to be discharged to:: Assisted living               Home Equipment: Gilford Rile - 2 wheels;Wheelchair - manual;Shower seat;Grab bars - tub/shower;Toilet riser      Prior Function Level of Independence: Independent with assistive device(s)   Gait / Transfers Assistance Needed: Merchant navy officer  ADL's / Homemaking Assistance Needed: assisted by ALF  staff        Hand Dominance   Dominant Hand: Right    Extremity/Trunk Assessment   Upper Extremity Assessment Upper Extremity Assessment: Overall WFL for tasks assessed    Lower Extremity Assessment Lower Extremity Assessment: Overall WFL for tasks assessed    Cervical / Trunk Assessment Cervical / Trunk Assessment: Normal  Communication   Communication: No difficulties  Cognition Arousal/Alertness: Awake/alert Behavior During Therapy: WFL for tasks assessed/performed Overall Cognitive Status: Within Functional Limits for tasks assessed                                        General Comments      Exercises     Assessment/Plan    PT Assessment Patient needs continued PT services  PT Problem List Decreased strength;Decreased activity tolerance;Decreased balance;Decreased coordination;Decreased knowledge of precautions;Decreased safety awareness;Decreased cognition;Pain       PT Treatment Interventions Functional mobility training;Therapeutic activities;Patient/family education;Balance training;Therapeutic exercise    PT Goals (Current goals can be found in the Care Plan section)  Acute Rehab PT Goals Patient Stated Goal: go back to ALF PT Goal Formulation: With patient Time For Goal Achievement: 02/29/20 Potential to Achieve Goals: Good    Frequency     Barriers to discharge        Co-evaluation               AM-PAC PT "6 Clicks" Mobility  Outcome Measure Help needed turning from your back to your side while in a flat bed without using bedrails?: None Help needed moving from lying on your back to sitting on the side of a flat bed without using bedrails?: A Little Help needed moving to and from a bed to a chair (including a wheelchair)?: None Help needed standing up from a chair using your arms (e.g., wheelchair or bedside chair)?: None Help needed to walk in hospital room?: None Help needed climbing 3-5 steps with a railing? : A  Lot 6 Click Score: 21    End of Session   Activity Tolerance: Patient tolerated treatment well;Patient limited by fatigue Patient left: in chair;with call bell/phone within reach;with family/visitor present Nurse Communication: Mobility status PT Visit Diagnosis: Unsteadiness on feet (R26.81);History of falling (Z91.81);Muscle weakness (generalized) (M62.81);Pain;Other abnormalities of gait and mobility (R26.89) Pain - Right/Left: Left Pain - part of body:  (over thorax from rib fx site)    Time: 1205-1223 PT Time Calculation (min) (ACUTE ONLY): 18 min   Charges:   PT Evaluation $PT Eval Low Complexity: 1 Low PT Treatments $Therapeutic Activity: 8-22 mins        2:50 PM , 02/24/20 Karlyn Agee, SPT Physical Therapy with [facility] [phone] office  During this treatment session, the therapist was present, participating in and directing the treatment.  2:50 PM, 02/24/20 Lonell Grandchild, MPT Physical Therapist with [facility] [phone] office [phone] mobile phone

## 2020-02-24 NOTE — ED Notes (Signed)
Pt's O2 dropped into the 70's after medication given. Pt placed [place] 2LPM via [place] O2 increased to 94%.

## 2020-02-24 NOTE — Progress Notes (Signed)
PROGRESS NOTE  [name]:[id number] DOB: 1928/12/12 DOA: 02/23/2020 PCP: Tilda Burrow, NP  Brief History:  86 y.o. female with medical history significant for paroxysmal atrial fibrillation not on anticoagulation, dementia, diabetes mellitus type 2, hypertension, hyperlipidemia, and osteoporosis who was brought to the ED via EMS from [place] assisted facility due to elevated BP.  Currently, patient has systolic blood pressure in the 200s.  Does not like, the patient has had some gait instability and uses a walker.  According to the patient's son, the patient "slid out of bed" from a seated position on the day prior to admission.  She has been complaining of left lower chest pain upper abdominal pain.  CT of the abdomen and pelvis revealed a large hypoattenuation of the lower pole of the spleen concerning for possible low-grade splenic laceration.  General surgery was consulted and recommended observation admission.  There was also incidental finding of subacute fracture of the posterior of 6 rib on the left.  The patient was hemodynamically stable and afebrile.  BMP, LFTs, CBC were essentially unremarkable.  Notably, patient had a recent hospital admission from 01/27/2020 to 01/30/2020 for acute CVA in the left periatrial white matter.  She was not felt to be a candidate for anticoagulation secondary to her frequent falls, advanced age, and comorbidities.  Her aspirin was increased from 81 mg to 325 mg. In addition, patient also had a recent admission from 11/15/2019 to 11/18/2019 where she was treated for NSTEMI.  Assessment/Plan: Hypertensive urgency-resolved IV fentanyl 75mcg and isosorbide mononitrate 50 mg p.o. x1 was given BP currently soft initially -Now patient is hypertensive -Restart carvedilol- -restart losartan and spironolactone  Mechanical fall -Patient denies fall from a standing position, she states that she slid out of bed to the floor -Continue fall  precaution and neurochecks -Continue PT/OT eval and treat  Splenic laceration  -CT abdomen pelvis showed  a new small wedge-shaped hypoattenuating defect in the lower pole of spleen which may represent a low-grade splenic laceration in the setting of trauma or splenic infarct  -Repeat CBC in a.m. -General surgery (Dr. Arnoldo Morale) was consulted by ED physician and will see patient  Impaired Glucose Tolerance Blood glucose 147, continue to monitor blood glucose with morning labs -01/27/20 A1C--6.1  Hypercholesterolemia Continue Zocor 40 mg p.o. daily  Ischemic cardiomyopathy/chronic systolic and diastolic CHF -Continue aspirin 325 mg daily, Zocor40 mg daily,  -restart coreg 6.25 mg bid -Restart Cozaar 50 mg daily,  Aldactone12.5 mg daily and isosorbide 15mg  daily  -11/16/2019 echo EF 35-40%, grade 1 DD  paroxysymal atrial fibrillation -Currently rate control and per EKG sinus rhythm. -Continue beta-blocker for rate management -Patient is not on anticoagulation due to to high fall risk. CHADsVASC score 5-6, not a candidate for anticoagulation  Chronic systolic and diastolic CHF Last LVEF (02/12/1913) was 35-40% Patient continues to follow-up with cardiology as an outpatient.  History of CVA Continue aspirin 325  mg p.o. daily Continue Zocor 40 mg p.o. daily  Dementia without behavioral Continue Aricept and Namenda       Status is: Observation  The patient remains OBS appropriate and will d/c before 2 midnights.  Dispo: The patient is from: ALF              Anticipated d/c is to: ALF              Anticipated d/c date is: 1 day  Patient currently is not medically stable to d/c.        Family Communication:   Son updated at bedside 8/12  Consultants:  General surgery  Code Status:  DNR  DVT Prophylaxis:  SCDs   Procedures: As Listed in Progress Note Above  Antibiotics: None  RN Pressure Injury Documentation:         Subjective: Patient denies fevers, chills, headache, chest pain, dyspnea, nausea, vomiting, diarrhea, abdominal pain, dysuria, hematuria, hematochezia, and melena.   Objective: Vitals:   02/24/20 0838 02/24/20 0902 02/24/20 0925 02/24/20 0942  BP: (!) 174/88 (!) 182/80  (!) 185/90  Pulse: 92 67  90  Resp: (!) 22 17  19   Temp:   98.3 F (36.8 C) 98 F (36.7 C)  TempSrc:   Oral   SpO2: 97% 93%  98%  Weight:      Height:       No intake or output data in the 24 hours ending 02/24/20 1100 Weight change:  Exam:   General:  Pt is alert, follows commands appropriately, not in acute distress  HEENT: No icterus, No thrush, No neck mass, [place]/AT  Cardiovascular: RRR, S1/S2, no rubs, no gallops  Respiratory: CTA bilaterally, no wheezing, no crackles, no rhonchi  Abdomen: Soft/+BS, non tender, non distended, no guarding  Extremities: No edema, No lymphangitis, No petechiae, No rashes, no synovitis   Data Reviewed: I have personally reviewed following labs and imaging studies Basic Metabolic Panel: Recent Labs  Lab 02/23/20 1830 02/24/20 0509  NA 140 141  K 4.1 3.6  CL 103 104  CO2 28 27  GLUCOSE 147* 121*  BUN 11 12  CREATININE 0.58 0.58  CALCIUM 9.1 9.0  MG  --  2.0  PHOS  --  3.8   Liver Function Tests: Recent Labs  Lab 02/24/20 0509  AST 15  ALT 15  ALKPHOS 47  BILITOT 0.7  PROT 6.2*  ALBUMIN 3.7   Recent Labs  Lab 02/23/20 1830  LIPASE 21   No results for input(s): AMMONIA in the last 168 hours. Coagulation Profile: Recent Labs  Lab 02/24/20 0509  INR 1.0   CBC: Recent Labs  Lab 02/23/20 1830 02/24/20 0509  WBC 8.2 8.6  NEUTROABS 4.0  --   HGB 11.0* 10.2*  HCT 34.7* 32.2*  MCV 71.8* 71.9*  PLT 242 227   Cardiac Enzymes: No results for input(s): CKTOTAL, CKMB, CKMBINDEX, TROPONINI in the last 168 hours. BNP: Invalid input(s): POCBNP CBG: No results for input(s): GLUCAP in the last 168 hours. HbA1C: No results for  input(s): HGBA1C in the last 72 hours. Urine analysis:    Component Value Date/Time   COLORURINE YELLOW 01/27/2020 2150   APPEARANCEUR HAZY (A) 01/27/2020 2150   LABSPEC 1.003 (L) 01/27/2020 2150   PHURINE 8.0 01/27/2020 2150   GLUCOSEU NEGATIVE 01/27/2020 2150   HGBUR SMALL (A) 01/27/2020 2150   BILIRUBINUR NEGATIVE 01/27/2020 2150   KETONESUR NEGATIVE 01/27/2020 2150   PROTEINUR NEGATIVE 01/27/2020 2150   UROBILINOGEN 0.2 01/18/2011 2222   NITRITE POSITIVE (A) 01/27/2020 2150   LEUKOCYTESUR LARGE (A) 01/27/2020 2150   Sepsis Labs: @LABRCNTIP (procalcitonin:4,lacticidven:4) ) Recent Results (from the past 240 hour(s))  SARS Coronavirus 2 by RT PCR (hospital order, performed in [place] hospital lab) Nasopharyngeal Nasopharyngeal Swab     Status: None   Collection Time: 02/24/20  3:21 AM   Specimen: Nasopharyngeal Swab  Result Value Ref Range Status   SARS Coronavirus 2 NEGATIVE NEGATIVE Final  Comment: (NOTE) SARS-CoV-2 target nucleic acids are NOT DETECTED.  The SARS-CoV-2 RNA is generally detectable in upper and lower respiratory specimens during the acute phase of infection. The lowest concentration of SARS-CoV-2 viral copies this assay can detect is 250 copies / mL. A negative result does not preclude SARS-CoV-2 infection and should not be used as the sole basis for treatment or other patient management decisions.  A negative result may occur with improper specimen collection / handling, submission of specimen other than nasopharyngeal swab, presence of viral mutation(s) within the areas targeted by this assay, and inadequate number of viral copies (<250 copies / mL). A negative result must be combined with clinical observations, patient history, and epidemiological information.  Fact Sheet for Patients:   StrictlyIdeas.no  Fact Sheet for Healthcare Providers: BankingDealers.co.za  This test is not yet approved or   cleared by the [place] FDA and has been authorized for detection and/or diagnosis of SARS-CoV-2 by FDA under an Emergency Use Authorization (EUA).  This EUA will remain in effect (meaning this test can be used) for the duration of the COVID-19 declaration under Section 564(b)(1) of the Act, 21 U.S.C. section 360bbb-3(b)(1), unless the authorization is terminated or revoked sooner.  Performed at [facility], [address] [place], [place] [address]      Scheduled Meds: . aspirin  325 mg Oral Daily  . carvedilol  12.5 mg Oral BID WC  . donepezil  5 mg Oral QHS  . enoxaparin (LOVENOX) injection  40 mg Subcutaneous QHS  . ferrous sulfate  325 mg Oral BID WC  . isosorbide mononitrate  15 mg Oral Once  . memantine  10 mg Oral BID  . omega-3 acid ethyl esters  2 g Oral Daily  . senna-docusate  2 tablet Oral QHS  . simvastatin  40 mg Oral QPM   Continuous Infusions:  Procedures/Studies: DG Chest 2 View  Result Date: 02/23/2020 CLINICAL DATA:  86 year old female with chest pain, left side ribbon axillary pain. Recent fall. EXAM: CHEST - 2 VIEW COMPARISON:  Chest CT 11/15/2019 and earlier. FINDINGS: Semi upright AP and lateral views of the chest. Stable cardiomegaly. Other mediastinal contours are within normal limits. Visualized tracheal air column is within normal limits. Mild bilateral increased pulmonary interstitial markings are stable. No pneumothorax, pleural effusion or acute pulmonary opacity. Negative visible bowel gas pattern. Stable cholecystectomy clips. Abdominal Calcified aortic atherosclerosis. Chronic L1 compression fracture. No displaced left rib fracture identified. Stable visualized osseous structures. Asymmetric degeneration of the right AC joint. IMPRESSION: 1. No acute cardiopulmonary abnormality. No displaced left rib fracture identified. 2. Chronic cardiomegaly and pulmonary interstitial changes. Electronically Signed   By: Genevie Ann M.D.   On: 02/23/2020 19:19    CT Chest W Contrast  Result Date: 02/23/2020 CLINICAL DATA:  Rib fracture suspected.  Left-sided rib pain. EXAM: CT CHEST, ABDOMEN, AND PELVIS WITH CONTRAST TECHNIQUE: Multidetector CT imaging of the chest, abdomen and pelvis was performed following the standard protocol during bolus administration of intravenous contrast. CONTRAST:  154mL OMNIPAQUE IOHEXOL 300 MG/ML  SOLN COMPARISON:  CT dated Nov 16, 2019 and Nov 15, 2019 FINDINGS: CT CHEST FINDINGS Cardiovascular: There is cardiomegaly. There is no evidence for thoracic aortic dissection. Atherosclerotic changes are noted of the thoracic aorta. There is no significant pericardial effusion. The arch vessels are patent. Mediastinum/Nodes: -- No mediastinal lymphadenopathy. -- No hilar lymphadenopathy. -- No axillary lymphadenopathy. -- No supraclavicular lymphadenopathy. --the patient is status post right hemithyroidectomy. The visualized portions of  the left thyroid gland are unremarkable. -  Unremarkable esophagus. Lungs/Pleura: There is a stable opacity at the right lung apex. There is no pneumothorax. There is a somewhat mosaic appearance of the lung parenchyma bilaterally. There is atelectasis at the lung bases. There are areas of scarring at the lung bases. Musculoskeletal: There is a subacute appearing fracture of the posterior sixth rib on the left, new since Nov 15, 2019 there is no evidence for an acute displaced left-sided rib fracture. CT ABDOMEN PELVIS FINDINGS Hepatobiliary: The liver is normal. Status post cholecystectomy.There is no biliary ductal dilation. Pancreas: Normal contours without ductal dilatation. No peripancreatic fluid collection. Spleen: There is a new small wedge-shaped hypoattenuating defect in the lower pole of the spleen (axial series 2, image 57). There is no perisplenic hematoma. Adrenals/Urinary Tract: --Adrenal glands: Unremarkable. --Right kidney/ureter: No hydronephrosis or radiopaque kidney stones. --Left kidney/ureter:  No hydronephrosis or radiopaque kidney stones. --Urinary bladder: The urinary bladder is significantly distended. There are few pockets of gas within the urinary bladder. Stomach/Bowel: --Stomach/Duodenum: No hiatal hernia or other gastric abnormality. Normal duodenal course and caliber. --Small bowel: Unremarkable. --Colon: Rectosigmoid diverticulosis without acute inflammation. --Appendix: Normal. Vascular/Lymphatic: Atherosclerotic calcification is present within the non-aneurysmal abdominal aorta, without hemodynamically significant stenosis. 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 There is a mild-to-moderate long segment stenosis of the proximal SMA. The IMA is patent. --No retroperitoneal lymphadenopathy. --No mesenteric lymphadenopathy. --No pelvic or inguinal lymphadenopathy. Reproductive: Unremarkable Other: No ascites or free air. The abdominal wall is normal. Musculoskeletal. There is a stable compression fracture of the L1 vertebral body. No new acute displaced lumbar fracture identified on today's study. IMPRESSION: 1. Subacute appearing fracture of the posterior sixth rib on the left, new since Nov 15, 2019. No pneumothorax. 2. There is a new small wedge-shaped hypoattenuating defect in the lower pole of the spleen. This is new since prior CT in May 2021 and may represent a low-grade splenic laceration in the setting of trauma or a splenic infarct. There is no significant perisplenic hematoma. 3. 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 There is a mild-to-moderate long segment stenosis of the proximal SMA. The IMA is patent. 4. The urinary bladder is significantly distended. There are few pockets of gas within the urinary bladder. Correlate with recent instrumentation. 5. Cardiomegaly. 6. Mosaic appearance of the lung parenchyma which is nonspecific and can be seen with small airway disease or chronic pulmonary hypertension. Aortic Atherosclerosis (ICD10-I70.0).  Electronically Signed   By: Constance Holster M.D.   On: 02/23/2020 21:22   MR ANGIO HEAD WO CONTRAST  Result Date: 01/27/2020 CLINICAL DATA:  Initial evaluation for acute dizziness, hypertension. EXAM: MRI HEAD WITHOUT CONTRAST MRA HEAD WITHOUT CONTRAST TECHNIQUE: Multiplanar, multiecho pulse sequences of the brain and surrounding structures were obtained without intravenous contrast. Angiographic images of the head were obtained using MRA technique without contrast. COMPARISON:  Comparison made with prior head CT from 09/05/2016. FINDINGS: MRI HEAD FINDINGS Brain: Temporal lobe predominant cerebral atrophy noted. Patchy T2/FLAIR hyperintensity within the periventricular and deep white matter both cerebral hemispheres most consistent with chronic small vessel ischemic disease, mild to moderate in nature. Mild patchy involvement of the pons noted. Superimposed small remote lacunar infarct noted at the right basal ganglia. 6 mm focus of diffusion abnormality seen involving the left periatrial white matter (series 3, image 29), consistent with an acute to early subacute small vessel type infarct. No associated hemorrhage or mass effect. No other  diffusion abnormality to suggest acute or subacute ischemia. Gray-white matter differentiation otherwise maintained. No encephalomalacia to suggest chronic cortical infarction. No evidence for acute intracranial hemorrhage. Few scattered punctate foci of susceptibility artifact noted involving the supratentorial brain, consistent with small chronic micro hemorrhages, favored to be hypertensive in nature. No mass lesion, midline shift or mass effect. No hydrocephalus or extra-axial fluid collection. Pituitary gland suprasellar region normal. Midline structures intact. Vascular: Major intracranial vascular flow voids are maintained. Skull and upper cervical spine: Craniocervical junction within normal limits. Upper cervical spine normal. Bone marrow signal intensity within  normal limits. No scalp soft tissue abnormality. Sinuses/Orbits: Patient status post bilateral ocular lens replacement. Paranasal sinuses are largely clear. Trace right mastoid effusion noted, of doubtful significance. Inner ear structures grossly normal. Other: None. MRA HEAD FINDINGS ANTERIOR CIRCULATION: Visualized distal cervical segments of the internal carotid arteries are patent with symmetric antegrade flow. Petrous, cavernous, and supraclinoid ICAs widely patent without stenosis or other abnormality. ICA termini well perfused. A1 segments patent bilaterally. Normal anterior communicating artery complex. Anterior cerebral arteries widely patent to their distal aspects without stenosis. No M1 stenosis or occlusion. Normal MCA bifurcations. Distal MCA branches well perfused and symmetric. POSTERIOR CIRCULATION: Vertebral arteries patent to the vertebrobasilar junction without stenosis. Left vertebral artery dominant. Both picas patent proximally. Basilar widely patent to its distal aspect without stenosis. Superior cerebral arteries patent bilaterally. Both PCAs primarily supplied via the basilar and are well perfused or distal aspects without stenosis. Small bilateral posterior communicating arteries noted. No intracranial aneurysm or other vascular abnormality. IMPRESSION: MRI HEAD IMPRESSION: 1. 6 mm focus of diffusion abnormality involving the left periatrial white matter, consistent with an acute to early subacute small vessel type infarct. No associated hemorrhage or mass effect. 2. Temporal lobe predominant cerebral atrophy with mild to moderate chronic microvascular ischemic disease. MRA HEAD IMPRESSION: Negative intracranial MRA. No large vessel occlusion. No hemodynamically significant or correctable stenosis. Electronically Signed   By: Jeannine Boga M.D.   On: 01/27/2020 21:54   MR Brain Wo Contrast (neuro protocol)  Result Date: 01/27/2020 CLINICAL DATA:  Initial evaluation for acute  dizziness, hypertension. EXAM: MRI HEAD WITHOUT CONTRAST MRA HEAD WITHOUT CONTRAST TECHNIQUE: Multiplanar, multiecho pulse sequences of the brain and surrounding structures were obtained without intravenous contrast. Angiographic images of the head were obtained using MRA technique without contrast. COMPARISON:  Comparison made with prior head CT from 09/05/2016. FINDINGS: MRI HEAD FINDINGS Brain: Temporal lobe predominant cerebral atrophy noted. Patchy T2/FLAIR hyperintensity within the periventricular and deep white matter both cerebral hemispheres most consistent with chronic small vessel ischemic disease, mild to moderate in nature. Mild patchy involvement of the pons noted. Superimposed small remote lacunar infarct noted at the right basal ganglia. 6 mm focus of diffusion abnormality seen involving the left periatrial white matter (series 3, image 29), consistent with an acute to early subacute small vessel type infarct. No associated hemorrhage or mass effect. No other diffusion abnormality to suggest acute or subacute ischemia. Gray-white matter differentiation otherwise maintained. No encephalomalacia to suggest chronic cortical infarction. No evidence for acute intracranial hemorrhage. Few scattered punctate foci of susceptibility artifact noted involving the supratentorial brain, consistent with small chronic micro hemorrhages, favored to be hypertensive in nature. No mass lesion, midline shift or mass effect. No hydrocephalus or extra-axial fluid collection. Pituitary gland suprasellar region normal. Midline structures intact. Vascular: Major intracranial vascular flow voids are maintained. Skull and upper cervical spine: Craniocervical junction within normal limits. Upper cervical spine normal.  Bone marrow signal intensity within normal limits. No scalp soft tissue abnormality. Sinuses/Orbits: Patient status post bilateral ocular lens replacement. Paranasal sinuses are largely clear. Trace right mastoid  effusion noted, of doubtful significance. Inner ear structures grossly normal. Other: None. MRA HEAD FINDINGS ANTERIOR CIRCULATION: Visualized distal cervical segments of the internal carotid arteries are patent with symmetric antegrade flow. Petrous, cavernous, and supraclinoid ICAs widely patent without stenosis or other abnormality. ICA termini well perfused. A1 segments patent bilaterally. Normal anterior communicating artery complex. Anterior cerebral arteries widely patent to their distal aspects without stenosis. No M1 stenosis or occlusion. Normal MCA bifurcations. Distal MCA branches well perfused and symmetric. POSTERIOR CIRCULATION: Vertebral arteries patent to the vertebrobasilar junction without stenosis. Left vertebral artery dominant. Both picas patent proximally. Basilar widely patent to its distal aspect without stenosis. Superior cerebral arteries patent bilaterally. Both PCAs primarily supplied via the basilar and are well perfused or distal aspects without stenosis. Small bilateral posterior communicating arteries noted. No intracranial aneurysm or other vascular abnormality. IMPRESSION: MRI HEAD IMPRESSION: 1. 6 mm focus of diffusion abnormality involving the left periatrial white matter, consistent with an acute to early subacute small vessel type infarct. No associated hemorrhage or mass effect. 2. Temporal lobe predominant cerebral atrophy with mild to moderate chronic microvascular ischemic disease. MRA HEAD IMPRESSION: Negative intracranial MRA. No large vessel occlusion. No hemodynamically significant or correctable stenosis. Electronically Signed   By: Jeannine Boga M.D.   On: 01/27/2020 21:54   CT ABDOMEN PELVIS W CONTRAST  Result Date: 02/23/2020 CLINICAL DATA:  Rib fracture suspected.  Left-sided rib pain. EXAM: CT CHEST, ABDOMEN, AND PELVIS WITH CONTRAST TECHNIQUE: Multidetector CT imaging of the chest, abdomen and pelvis was performed following the standard protocol during  bolus administration of intravenous contrast. CONTRAST:  116mL OMNIPAQUE IOHEXOL 300 MG/ML  SOLN COMPARISON:  CT dated Nov 16, 2019 and Nov 15, 2019 FINDINGS: CT CHEST FINDINGS Cardiovascular: There is cardiomegaly. There is no evidence for thoracic aortic dissection. Atherosclerotic changes are noted of the thoracic aorta. There is no significant pericardial effusion. The arch vessels are patent. Mediastinum/Nodes: -- No mediastinal lymphadenopathy. -- No hilar lymphadenopathy. -- No axillary lymphadenopathy. -- No supraclavicular lymphadenopathy. --the patient is status post right hemithyroidectomy. The visualized portions of the left thyroid gland are unremarkable. -  Unremarkable esophagus. Lungs/Pleura: There is a stable opacity at the right lung apex. There is no pneumothorax. There is a somewhat mosaic appearance of the lung parenchyma bilaterally. There is atelectasis at the lung bases. There are areas of scarring at the lung bases. Musculoskeletal: There is a subacute appearing fracture of the posterior sixth rib on the left, new since Nov 15, 2019 there is no evidence for an acute displaced left-sided rib fracture. CT ABDOMEN PELVIS FINDINGS Hepatobiliary: The liver is normal. Status post cholecystectomy.There is no biliary ductal dilation. Pancreas: Normal contours without ductal dilatation. No peripancreatic fluid collection. Spleen: There is a new small wedge-shaped hypoattenuating defect in the lower pole of the spleen (axial series 2, image 57). There is no perisplenic hematoma. Adrenals/Urinary Tract: --Adrenal glands: Unremarkable. --Right kidney/ureter: No hydronephrosis or radiopaque kidney stones. --Left kidney/ureter: No hydronephrosis or radiopaque kidney stones. --Urinary bladder: The urinary bladder is significantly distended. There are few pockets of gas within the urinary bladder. Stomach/Bowel: --Stomach/Duodenum: No hiatal hernia or other gastric abnormality. Normal duodenal course and  caliber. --Small bowel: Unremarkable. --Colon: Rectosigmoid diverticulosis without acute inflammation. --Appendix: Normal. Vascular/Lymphatic: Atherosclerotic calcification is present within the non-aneurysmal abdominal aorta, without hemodynamically  significant stenosis. 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 There is a mild-to-moderate long segment stenosis of the proximal SMA. The IMA is patent. --No retroperitoneal lymphadenopathy. --No mesenteric lymphadenopathy. --No pelvic or inguinal lymphadenopathy. Reproductive: Unremarkable Other: No ascites or free air. The abdominal wall is normal. Musculoskeletal. There is a stable compression fracture of the L1 vertebral body. No new acute displaced lumbar fracture identified on today's study. IMPRESSION: 1. Subacute appearing fracture of the posterior sixth rib on the left, new since Nov 15, 2019. No pneumothorax. 2. There is a new small wedge-shaped hypoattenuating defect in the lower pole of the spleen. This is new since prior CT in May 2021 and may represent a low-grade splenic laceration in the setting of trauma or a splenic infarct. There is no significant perisplenic hematoma. 3. 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 There is a mild-to-moderate long segment stenosis of the proximal SMA. The IMA is patent. 4. The urinary bladder is significantly distended. There are few pockets of gas within the urinary bladder. Correlate with recent instrumentation. 5. Cardiomegaly. 6. Mosaic appearance of the lung parenchyma which is nonspecific and can be seen with small airway disease or chronic pulmonary hypertension. Aortic Atherosclerosis (ICD10-I70.0). Electronically Signed   By: Constance Holster M.D.   On: 02/23/2020 21:22   VAS US CAROTID  Result Date: 01/30/2020 Carotid Arterial Duplex Study Indications:       CVA and Dizziness. Risk Factors:      Hypertension, hyperlipidemia, Diabetes. Other Factors:     Atrial  fibrillation. Comparison Study:  No prior study on file Performing Technologist: Sharion Dove RVS  Examination Guidelines: A complete evaluation includes B-mode imaging, spectral Doppler, color Doppler, and power Doppler as needed of all accessible portions of each vessel. Bilateral testing is considered an integral part of a complete examination. Limited examinations for reoccurring indications may be performed as noted.  Right Carotid Findings: +----------+--------+--------+--------+------------------+------------------+           PSV cm/sEDV cm/sStenosisPlaque DescriptionComments           +----------+--------+--------+--------+------------------+------------------+ CCA Prox  57      6                                 intimal thickening +----------+--------+--------+--------+------------------+------------------+ CCA Distal69      15                                intimal thickening +----------+--------+--------+--------+------------------+------------------+ ICA Prox  43      13              heterogenous      tortuous           +----------+--------+--------+--------+------------------+------------------+ ICA Distal51      12                                tortuous           +----------+--------+--------+--------+------------------+------------------+ ECA       40      2                                                    +----------+--------+--------+--------+------------------+------------------+ +----------+--------+-------+--------+-------------------+  PSV cm/sEDV cmsDescribeArm Pressure (mmHG) +----------+--------+-------+--------+-------------------+ [id number]                                         +----------+--------+-------+--------+-------------------+ +---------+--------+--+--------+-+ VertebralPSV cm/s21EDV cm/s5 +---------+--------+--+--------+-+  Left Carotid Findings:  +----------+--------+--------+--------+------------------+------------------+           PSV cm/sEDV cm/sStenosisPlaque DescriptionComments           +----------+--------+--------+--------+------------------+------------------+ CCA Prox  80      11                                intimal thickening +----------+--------+--------+--------+------------------+------------------+ CCA Distal58      15                                intimal thickening +----------+--------+--------+--------+------------------+------------------+ ICA Prox  42      13              heterogenous                         +----------+--------+--------+--------+------------------+------------------+ ICA Distal31      9                                                    +----------+--------+--------+--------+------------------+------------------+ ECA       65      15                                                   +----------+--------+--------+--------+------------------+------------------+ +----------+--------+--------+--------+-------------------+           PSV cm/sEDV cm/sDescribeArm Pressure (mmHG) +----------+--------+--------+--------+-------------------+ Subclavian100                                         +----------+--------+--------+--------+-------------------+ +---------+--------+--+--------+--+ VertebralPSV cm/s36EDV cm/s12 +---------+--------+--+--------+--+   Summary: Right Carotid: The extracranial vessels were near-normal with only minimal wall                thickening or plaque. Left Carotid: The extracranial vessels were near-normal with only minimal wall               thickening or plaque. Vertebrals:  Bilateral vertebral arteries demonstrate antegrade flow. Subclavians: Normal flow hemodynamics were seen in bilateral subclavian              arteries. *See table(s) above for measurements and observations.  Electronically signed by Antony Contras MD on 01/30/2020 at  11:07:46 AM.    Final     Orson Eva, DO  [facility]  If 7PM-7AM, please contact night-coverage [email or web address] Password [email or web address] 02/24/2020, 11:00 AM   LOS: 0 days

## 2020-02-24 NOTE — Plan of Care (Addendum)
  Problem: Acute Rehab PT Goals(only PT should resolve) Goal: Pt Will Go Supine/Side To Sit Outcome: Progressing Flowsheets (Taken 02/24/2020 1312) Pt will go Supine/Side to Sit: . with modified independence . with supervision Goal: Patient Will Transfer Sit To/From Stand Outcome: Progressing Flowsheets (Taken 02/24/2020 1312) Patient will transfer sit to/from stand: with modified independence Goal: Pt Will Transfer Bed To Chair/Chair To Bed Outcome: Progressing Flowsheets (Taken 02/24/2020 1312) Pt will Transfer Bed to Chair/Chair to Bed: with modified independence Goal: Pt Will Ambulate Outcome: Progressing Flowsheets (Taken 02/24/2020 1312) Pt will Ambulate: . 75 feet . with modified independence . with rolling walker    1:13 PM , 02/24/20 Karlyn Agee, SPT Physical Therapy with [facility] [phone] office  During this treatment session, the therapist was present, participating in and directing the treatment.  2:51 PM, 02/24/20 Lonell Grandchild, MPT Physical Therapist with [facility] [phone] office [phone] mobile phone

## 2020-02-25 DIAGNOSIS — S36039D Unspecified laceration of spleen, subsequent encounter: Secondary | ICD-10-CM

## 2020-02-25 DIAGNOSIS — S2232XA Fracture of one rib, left side, initial encounter for closed fracture: Secondary | ICD-10-CM

## 2020-02-25 LAB — BASIC METABOLIC PANEL
Anion gap: 11 (ref 5–15)
BUN: 9 mg/dL (ref 8–23)
CO2: 25 mmol/L (ref 22–32)
Calcium: 8.6 mg/dL — ABNORMAL LOW (ref 8.9–10.3)
Chloride: 104 mmol/L (ref 98–111)
Creatinine, Ser: 0.58 mg/dL (ref 0.44–1.00)
GFR calc Af Amer: >60 mL/min (ref >60)
GFR calc non Af Amer: >60 mL/min (ref >60)
Glucose, Bld: 102 mg/dL — ABNORMAL HIGH (ref 70–99)
Potassium: 3.4 mmol/L — ABNORMAL LOW (ref 3.5–5.1)
Sodium: 140 mmol/L (ref 135–145)

## 2020-02-25 LAB — CBC
HCT: 31.7 % — ABNORMAL LOW (ref 36.0–46.0)
Hemoglobin: 10 g/dL — ABNORMAL LOW (ref 12.0–15.0)
MCH: 22.6 pg — ABNORMAL LOW (ref 26.0–34.0)
MCHC: 31.5 g/dL (ref 30.0–36.0)
MCV: 71.6 fL — ABNORMAL LOW (ref 80.0–100.0)
Platelets: 225 10*3/uL (ref 150–400)
RBC: 4.43 MIL/uL (ref 3.87–5.11)
RDW: 19.1 % — ABNORMAL HIGH (ref 11.5–15.5)
WBC: 8.7 10*3/uL (ref 4.0–10.5)
nRBC: 0 % (ref 0.0–0.2)

## 2020-02-25 LAB — MAGNESIUM: Magnesium: 2 mg/dL (ref 1.7–2.4)

## 2020-02-25 MED ORDER — POTASSIUM CHLORIDE CRYS ER 20 MEQ PO TBCR
20.0000 meq | EXTENDED_RELEASE_TABLET | Freq: Once | ORAL | Status: AC
  Administered 2020-02-25: 20 meq via ORAL
  Filled 2020-02-25: qty 1

## 2020-02-25 MED ORDER — ACETAMINOPHEN 325 MG PO TABS: 650.0000 mg | ORAL_TABLET | Freq: Four times a day (QID) | ORAL | Status: DC | PRN

## 2020-02-25 MED ORDER — LIDOCAINE 5 % EX PTCH: 1.0000 | MEDICATED_PATCH | CUTANEOUS | 0 refills | Status: DC

## 2020-02-25 MED ORDER — LIDOCAINE 5 % EX PTCH: 1.0000 | MEDICATED_PATCH | CUTANEOUS | Status: DC

## 2020-02-25 NOTE — Progress Notes (Signed)
Nsg Discharge Note  Admit Date:  02/23/2020 Discharge date: 02/25/2020   [name] to be D/C'd to [facility] per MD order.  AVS completed.  Copy for chart, and copy for patient signed, and dated. Patient/caregiver able to verbalize understanding.  Discharge Medication: Allergies as of 02/25/2020      Reactions   Bee Venom    Listed per Nursing Center-No reaction is noted   Carbapenems    Listed per Nursing Center-No reaction is noted   Cephalosporins    Listed per Nursing Center-No reaction is noted   Procaine Hcl Other (See Comments)   Numbness/jerkiness   Sulfonamide Derivatives Other (See Comments)   hypertension   Penicillins Rash      Medication List    TAKE these medications   acetaminophen 325 MG tablet Commonly known as: TYLENOL Take 2 tablets (650 mg total) by mouth every 6 (six) hours as needed for mild pain, fever or headache. What changed: Another medication with the same name was added. Make sure you understand how and when to take each.   acetaminophen 325 MG tablet Commonly known as: TYLENOL Take 2 tablets (650 mg total) by mouth every 6 (six) hours as needed for mild pain (or Fever >/= 101). What changed: You were already taking a medication with the same name, and this prescription was added. Make sure you understand how and when to take each.   alendronate 70 MG tablet Commonly known as: FOSAMAX Take 70 mg by mouth every Wednesday. Take with a full glass of water on an empty stomach.   aspirin 325 MG EC tablet Take 1 tablet (325 mg total) by mouth daily.   calcium citrate-vitamin D 500-400 MG-UNIT chewable tablet Chew by mouth 2 (two) times daily.   carvedilol 6.25 MG tablet Commonly known as: COREG Take 1 tablet (6.25 mg total) by mouth 2 (two) times daily with a meal.   Cranberry 450 MG Tabs Take 450 mg by mouth 2 (two) times daily.   cycloSPORINE 0.05 % ophthalmic emulsion Commonly known as: RESTASIS Place 1 drop into both  eyes 2 (two) times daily.   donepezil 5 MG tablet Commonly known as: ARICEPT Take 1 tablet (5 mg total) by mouth at bedtime.   ferrous sulfate 325 (65 FE) MG tablet Take 325 mg by mouth in the morning and at bedtime.   fish oil-omega-3 fatty acids 1000 MG capsule Take 2 g by mouth daily.   isosorbide mononitrate 30 MG 24 hr tablet Commonly known as: IMDUR Take 0.5 tablets (15 mg total) by mouth every evening.   lidocaine 5 % Commonly known as: LIDODERM Place 1 patch onto the skin daily. Remove & Discard patch within 12 hours or as directed by MD   losartan 50 MG tablet Commonly known as: COZAAR Take 1 tablet (50 mg total) by mouth daily.   meclizine 12.5 MG tablet Commonly known as: ANTIVERT Take 12.5 mg by mouth every 6 (six) hours as needed for dizziness.   memantine 10 MG tablet Commonly known as: NAMENDA Take 1 tablet (10 mg total) by mouth 2 (two) times daily.   nortriptyline 25 MG capsule Commonly known as: PAMELOR Take 25 mg by mouth at bedtime.   senna-docusate 8.6-50 MG tablet Commonly known as: Senokot-S Take 2 tablets by mouth at bedtime.   simvastatin 40 MG tablet Commonly known as: ZOCOR Take 1 tablet (40 mg total) by mouth every evening.   spironolactone 25 MG tablet Commonly known as: ALDACTONE Take 0.5  tablets (12.5 mg total) by mouth daily.   tamsulosin 0.4 MG Caps capsule Commonly known as: FLOMAX Take 1 capsule (0.4 mg total) by mouth daily after supper.       Discharge Assessment: Vitals:   02/24/20 2053 02/25/20 0516  BP: (!) 136/51 (!) 171/69  Pulse: 66 61  Resp: 15 16  Temp: 98.6 F (37 C) 97.9 F (36.6 C)  SpO2: 95% 93%   Skin clean, dry and intact without evidence of skin break down, no evidence of skin tears noted. IV catheter discontinued intact. Site without signs and symptoms of complications - no redness or edema noted at insertion site, patient denies c/o pain - only slight tenderness at site.  Dressing with slight  pressure applied.  D/c Instructions-Education: Discharge instructions given to patient/family with verbalized understanding. D/c education completed with patient/family including follow up instructions, medication list, d/c activities limitations if indicated, with other d/c instructions as indicated by MD - patient able to verbalize understanding, all questions fully answered. Patient instructed to return to ED, call 911, or call MD for any changes in condition.  Patient escorted via [place], and D/C to [facility] [facility] via private auto.  Zachery Conch, RN 02/25/2020 1:33 PM

## 2020-02-25 NOTE — NC FL2 (Signed)
[place] LEVEL OF CARE SCREENING TOOL     IDENTIFICATION  Patient Name: [name] Birthdate: 1928/12/19 Sex: female Admission Date (Current Location): 02/23/2020  [facility] and [place] Number:  [employer] and Address:  [place] [address] [place]      Provider Number: [id number]  Attending Physician Name and Address:  No att. providers found  Relative Name and Phone Number:  Militza Devery [phone]    Current Level of Care: Hospital (ALF) Recommended Level of Care: [place] Prior Approval Number:    Date Approved/Denied:   PASRR Number:    Discharge Plan: Other (Comment) (ALF)    Current Diagnoses: Patient Active Problem List   Diagnosis Date Noted  . Closed fracture of one rib of left side   . History of CVA (cerebrovascular accident) 02/24/2020  . Fall at home, initial encounter 02/24/2020  . Splenic laceration 02/24/2020  . History of atrial fibrillation 02/24/2020  . Hypertensive urgency 02/23/2020  . Microcytic anemia 01/28/2020  . Acute lower UTI 01/28/2020  . Acute CVA (cerebrovascular accident) ([place]) 01/27/2020  . Cardiomyopathy ([place]) 11/17/2019  . Ascending aortic aneurysm ([place]) 11/17/2019  . NSTEMI (non-ST elevated myocardial infarction) ([place]) 11/16/2019  . Chest pain 11/15/2019  . Postoperative anemia due to acute blood loss 09/08/2016  . Closed intertrochanteric fracture of left hip, initial encounter ([place]) 09/05/2016  . Dementia ([place]) 09/05/2016  . Abdominal pain 01/10/2011  . DERANGEMENT OF POSTERIOR HORN OF MEDIAL MENISCUS 09/25/2010  . CLOSED DISLOCATION OF DISTAL RADIOULNAR 03/28/2010  . COLONIC POLYPS, ADENOMATOUS, HX OF 07/31/2009  . PAROXYSMAL ATRIAL FIBRILLATION 10/26/2008  . Deficiency anemia 10/25/2008  . OTHER THALASSEMIA 10/25/2008  . DIVERTICULITIS, COLON 10/25/2008  . Diabetes mellitus due to underlying condition, controlled ([place]) 10/24/2008  .  HYPERCHOLESTEROLEMIA 10/24/2008  . Iron deficiency anemia 10/24/2008  . Essential hypertension 10/24/2008  . Osteoporosis 10/24/2008  . HYPERTHYROIDISM, HX OF 10/24/2008    Orientation RESPIRATION BLADDER Height & Weight     Self, Situation, Place  Normal Continent Weight: 143 lb 11.8 oz (65.2 kg) Height:  5\' 2"  (157.5 cm)  BEHAVIORAL SYMPTOMS/MOOD NEUROLOGICAL BOWEL NUTRITION STATUS      Continent Diet (Diet Heart Room service appropriate? Yes; Fluid consistency: Thin)  AMBULATORY STATUS COMMUNICATION OF NEEDS Skin   Extensive Assist Verbally Skin abrasions (Leg)                       Personal Care Assistance Level of Assistance  Bathing, Feeding, Dressing Bathing Assistance: Maximum assistance Feeding assistance: Maximum assistance Dressing Assistance: Maximum assistance     Functional Limitations Info  Sight, Hearing, Speech Sight Info: Adequate Hearing Info: Adequate Speech Info: Adequate    SPECIAL CARE FACTORS FREQUENCY  PT (By licensed PT)     PT Frequency: 5x              Contractures Contractures Info: Not present    Additional Factors Info  Code Status, Allergies Code Status Info: DNR Allergies Info: Bee VenomCarbapenemsCephalosporinsProcaine HclSulfonamide DerivativesPenicillins           Current Medications (02/25/2020):  This is the current hospital active medication list Current Facility-Administered Medications  Medication Dose Route Frequency Provider Last Rate Last Admin  . acetaminophen (TYLENOL) tablet 650 mg  650 mg Oral Q6H PRN Adefeso, Oladapo, DO       Or  . acetaminophen (TYLENOL) suppository 650 mg  650 mg Rectal Q6H PRN Bernadette Hoit,  DO      . aspirin EC tablet 325 mg  325 mg Oral Daily Adefeso, Oladapo, DO   325 mg at 02/25/20 0929  . carvedilol (COREG) tablet 6.25 mg  6.25 mg Oral BID WC Tat, Shanon Brow, MD   6.25 mg at 02/25/20 0930  . donepezil (ARICEPT) tablet 5 mg  5 mg Oral QHS Adefeso, Oladapo, DO   5 mg at 02/24/20  2151  . enoxaparin (LOVENOX) injection 40 mg  40 mg Subcutaneous QHS Adefeso, Oladapo, DO   40 mg at 02/24/20 2151  . ferrous sulfate tablet 325 mg  325 mg Oral BID WC Adefeso, Oladapo, DO   325 mg at 02/25/20 0930  . isosorbide mononitrate (IMDUR) 24 hr tablet 15 mg  15 mg Oral Daily Tat, David, MD   15 mg at 02/25/20 0932  . lidocaine (LIDODERM) 5 % 1 patch  1 patch Transdermal Q24H Tat, David, MD      . losartan (COZAAR) tablet 50 mg  50 mg Oral Daily Tat, David, MD   50 mg at 02/25/20 0929  . memantine (NAMENDA) tablet 10 mg  10 mg Oral BID Adefeso, Oladapo, DO   10 mg at 02/25/20 0929  . omega-3 acid ethyl esters (LOVAZA) capsule 2 g  2 g Oral Daily Adefeso, Oladapo, DO   2 g at 02/25/20 0932  . oxyCODONE (Oxy IR/ROXICODONE) immediate release tablet 5 mg  5 mg Oral Q4H PRN Adefeso, Oladapo, DO      . senna-docusate (Senokot-S) tablet 2 tablet  2 tablet Oral QHS Adefeso, Oladapo, DO   2 tablet at 02/24/20 2151  . simvastatin (ZOCOR) tablet 40 mg  40 mg Oral QPM Adefeso, Oladapo, DO   40 mg at 02/24/20 1834  . spironolactone (ALDACTONE) tablet 12.5 mg  12.5 mg Oral Daily Tat, David, MD   12.5 mg at 02/25/20 7510   Current Outpatient Medications  Medication Sig Dispense Refill  . acetaminophen (TYLENOL) 325 MG tablet Take 2 tablets (650 mg total) by mouth every 6 (six) hours as needed for mild pain, fever or headache. 30 tablet 1  . aspirin EC 325 MG EC tablet Take 1 tablet (325 mg total) by mouth daily. 30 tablet 0  . calcium citrate-vitamin D 500-400 MG-UNIT chewable tablet Chew by mouth 2 (two) times daily.    . carvedilol (COREG) 6.25 MG tablet Take 1 tablet (6.25 mg total) by mouth 2 (two) times daily with a meal. 60 tablet 3  . Cranberry 450 MG TABS Take 450 mg by mouth 2 (two) times daily.    . cycloSPORINE (RESTASIS) 0.05 % ophthalmic emulsion Place 1 drop into both eyes 2 (two) times daily.    Marland Kitchen donepezil (ARICEPT) 5 MG tablet Take 1 tablet (5 mg total) by mouth at bedtime. 30 tablet  3  . ferrous sulfate 325 (65 FE) MG tablet Take 325 mg by mouth in the morning and at bedtime.    . fish oil-omega-3 fatty acids 1000 MG capsule Take 2 g by mouth daily.      . isosorbide mononitrate (IMDUR) 30 MG 24 hr tablet Take 0.5 tablets (15 mg total) by mouth every evening. 30 tablet 3  . losartan (COZAAR) 50 MG tablet Take 1 tablet (50 mg total) by mouth daily. 30 tablet 3  . memantine (NAMENDA) 10 MG tablet Take 1 tablet (10 mg total) by mouth 2 (two) times daily. 60 tablet 3  . nortriptyline (PAMELOR) 25 MG capsule Take 25 mg by mouth at  bedtime.    . senna-docusate (SENOKOT-S) 8.6-50 MG tablet Take 2 tablets by mouth at bedtime. 60 tablet 11  . simvastatin (ZOCOR) 40 MG tablet Take 1 tablet (40 mg total) by mouth every evening. 30 tablet 3  . spironolactone (ALDACTONE) 25 MG tablet Take 0.5 tablets (12.5 mg total) by mouth daily. 30 tablet 3  . tamsulosin (FLOMAX) 0.4 MG CAPS capsule Take 1 capsule (0.4 mg total) by mouth daily after supper. 30 capsule 0  . acetaminophen (TYLENOL) 325 MG tablet Take 2 tablets (650 mg total) by mouth every 6 (six) hours as needed for mild pain (or Fever >/= 101).    Marland Kitchen alendronate (FOSAMAX) 70 MG tablet Take 70 mg by mouth every Wednesday. Take with a full glass of water on an empty stomach.    . lidocaine (LIDODERM) 5 % Place 1 patch onto the skin daily. Remove & Discard patch within 12 hours or as directed by MD 30 patch 0  . meclizine (ANTIVERT) 12.5 MG tablet Take 12.5 mg by mouth every 6 (six) hours as needed for dizziness.       Discharge Medications: Please see discharge summary for a list of discharge medications. TAKE these medications   acetaminophen 325 MG tablet Commonly known as: TYLENOL Take 2 tablets (650 mg total) by mouth every 6 (six) hours as needed for mild pain, fever or headache. What changed: Another medication with the same name was added. Make sure you understand how and when to take each.   acetaminophen 325 MG  tablet Commonly known as: TYLENOL Take 2 tablets (650 mg total) by mouth every 6 (six) hours as needed for mild pain (or Fever >/= 101). What changed: You were already taking a medication with the same name, and this prescription was added. Make sure you understand how and when to take each.   alendronate 70 MG tablet Commonly known as: FOSAMAX Take 70 mg by mouth every Wednesday. Take with a full glass of water on an empty stomach.   aspirin 325 MG EC tablet Take 1 tablet (325 mg total) by mouth daily.   calcium citrate-vitamin D 500-400 MG-UNIT chewable tablet Chew by mouth 2 (two) times daily.   carvedilol 6.25 MG tablet Commonly known as: COREG Take 1 tablet (6.25 mg total) by mouth 2 (two) times daily with a meal.   Cranberry 450 MG Tabs Take 450 mg by mouth 2 (two) times daily.   cycloSPORINE 0.05 % ophthalmic emulsion Commonly known as: RESTASIS Place 1 drop into both eyes 2 (two) times daily.   donepezil 5 MG tablet Commonly known as: ARICEPT Take 1 tablet (5 mg total) by mouth at bedtime.   ferrous sulfate 325 (65 FE) MG tablet Take 325 mg by mouth in the morning and at bedtime.   fish oil-omega-3 fatty acids 1000 MG capsule Take 2 g by mouth daily.   isosorbide mononitrate 30 MG 24 hr tablet Commonly known as: IMDUR Take 0.5 tablets (15 mg total) by mouth every evening.   lidocaine 5 % Commonly known as: LIDODERM Place 1 patch onto the skin daily. Remove & Discard patch within 12 hours or as directed by MD   losartan 50 MG tablet Commonly known as: COZAAR Take 1 tablet (50 mg total) by mouth daily.   meclizine 12.5 MG tablet Commonly known as: ANTIVERT Take 12.5 mg by mouth every 6 (six) hours as needed for dizziness.   memantine 10 MG tablet Commonly known as: NAMENDA Take 1 tablet (10 mg  total) by mouth 2 (two) times daily.   nortriptyline 25 MG capsule Commonly known as: PAMELOR Take 25 mg by mouth at bedtime.   senna-docusate  8.6-50 MG tablet Commonly known as: Senokot-S Take 2 tablets by mouth at bedtime.   simvastatin 40 MG tablet Commonly known as: ZOCOR Take 1 tablet (40 mg total) by mouth every evening.   spironolactone 25 MG tablet Commonly known as: ALDACTONE Take 0.5 tablets (12.5 mg total) by mouth daily.   tamsulosin 0.4 MG Caps capsule Commonly known as: FLOMAX Take 1 capsule (0.4 mg total) by mouth daily after supper.     Relevant Imaging Results:  Relevant Lab Results:   Additional Information SSN Pt [id number]  Natasha Bence, [place]

## 2020-02-25 NOTE — Plan of Care (Signed)

## 2020-02-25 NOTE — Discharge Summary (Signed)
Physician Discharge Summary  [name] QIW:[id number] DOB: [birth year] DOA: 02/23/2020  PCP: Tilda Burrow, NP  Admit date: 02/23/2020 Discharge date: 02/25/2020  Admitted From: [facility] Disposition:  [facility]  Recommendations for Outpatient Follow-up:  1. Follow up with PCP in 1-2 weeks 2. Please obtain BMP/CBC in one week   Home Health: YES Equipment/Devices: HHPT  Discharge Condition: Stable CODE STATUS: FULL Diet recommendation: Heart Healthy    Brief/Interim Summary: 86 y.o.femalewith medical history significant for paroxysmalatrial fibrillation not on anticoagulation, dementia, diabetes mellitus type 2, hypertension, hyperlipidemia,andosteoporosiswhowas brought to the [facility] assisted facility due to elevated BP.  Currently, patient has systolic blood pressure in the 200s.  Does not like, the patient has had some gait instability and uses a walker.  According to the patient's son, the patient "slid out of bed" from a seated position on the day prior to admission.  She has been complaining of left lower chest pain upper abdominal pain.  CT of the abdomen and pelvis revealed a large hypoattenuation of the lower pole of the spleen concerning for possible low-grade splenic laceration.  General surgery was consulted and recommended observation admission.  There was also incidental finding of subacute fracture of the posterior of 6 rib on the left.  The patient was hemodynamically stable and afebrile.  BMP, LFTs, CBC were essentially unremarkable.  Notably, patient had a recent hospital admission from 01/27/2020 to 01/30/2020 for acute CVA in the left periatrial white matter.  She was not felt to be a candidate for anticoagulation secondary to her frequent falls, advanced age, and comorbidities.  Her aspirin was increased from 81 mg to 325 mg. In addition, patient also had a recent admission from 11/15/2019 to 11/18/2019 where she was treated for  NSTEMI.   Discharge Diagnoses:  Hypertensive urgency-resolved IV fentanyl 69mcgand isosorbide mononitrate 50 mg p.o. x1 was given BP currently soft initially -Now patient is hypertensive -Restart carvedilol- -restart losartan and spironolactone  Mechanical fall -Patient denies fall from a standing position, she states that she slid out of bed to the floor -Continue fall precaution and neurochecks -Continue PT/OT eval and treat-->HHPT -lidoderm patch for rib pain  Splenic laceration -CT abdomen pelvis showeda new small wedge-shaped hypoattenuating defect in the lower pole of spleen which may represent a low-grade splenic laceration in the setting of trauma or splenic infarct  -Repeat CBC-->Hgb stable -General surgery (Dr. Norton Blizzard nonoperative management and supportive care--ok to d/c with stable Hgb  Impaired Glucose Tolerance Blood glucose 147,continue to monitor blood glucose with morning labs -01/27/20 A1C--6.1  Hypercholesterolemia Continue Zocor 40 mg p.o. daily  Ischemic cardiomyopathy/chronic systolic and diastolic CHF -Continue [id number] mg daily, Zocor40 mg daily,  -restart coreg 6.25 mg bid -Restart Cozaar 50 mg daily,Aldactone12.5 mg dailyand isosorbide 15mg  daily -11/16/2019 echo EF 35-40%, grade 1 DD  paroxysymal atrial fibrillation -Currently rate control and per EKG sinus rhythm. -Continue beta-blocker for rate management -Patient is noton anticoagulationdue toto high fall risk. CHADsVASC score 5-6, not a candidate for anticoagulation  Chronic systolic and diastolic CHF Last LVEF (07/22/5629) was 35-40% Patient continues to follow-up with cardiology as an outpatient.  History of CVA Continue aspirin 325  mg p.o. daily Continue Zocor 40 mg p.o. daily  Dementia without behavioral Continue Aricept and Namenda   Discharge Instructions   Allergies as of 02/25/2020      Reactions   Bee Venom    Listed per Nursing  Center-No reaction is noted   Carbapenems    Listed per  Nursing Center-No reaction is noted   Cephalosporins    Listed per Nursing Center-No reaction is noted   Procaine Hcl Other (See Comments)   Numbness/jerkiness   Sulfonamide Derivatives Other (See Comments)   hypertension   Penicillins Rash      Medication List    TAKE these medications   acetaminophen 325 MG tablet Commonly known as: TYLENOL Take 2 tablets (650 mg total) by mouth every 6 (six) hours as needed for mild pain, fever or headache. What changed: Another medication with the same name was added. Make sure you understand how and when to take each.   acetaminophen 325 MG tablet Commonly known as: TYLENOL Take 2 tablets (650 mg total) by mouth every 6 (six) hours as needed for mild pain (or Fever >/= 101). What changed: You were already taking a medication with the same name, and this prescription was added. Make sure you understand how and when to take each.   alendronate 70 MG tablet Commonly known as: FOSAMAX Take 70 mg by mouth every Wednesday. Take with a full glass of water on an empty stomach.   aspirin 325 MG EC tablet Take 1 tablet (325 mg total) by mouth daily.   calcium citrate-vitamin D 500-400 MG-UNIT chewable tablet Chew by mouth 2 (two) times daily.   carvedilol 6.25 MG tablet Commonly known as: COREG Take 1 tablet (6.25 mg total) by mouth 2 (two) times daily with a meal.   Cranberry 450 MG Tabs Take 450 mg by mouth 2 (two) times daily.   cycloSPORINE 0.05 % ophthalmic emulsion Commonly known as: RESTASIS Place 1 drop into both eyes 2 (two) times daily.   donepezil 5 MG tablet Commonly known as: ARICEPT Take 1 tablet (5 mg total) by mouth at bedtime.   ferrous sulfate 325 (65 FE) MG tablet Take 325 mg by mouth in the morning and at bedtime.   fish oil-omega-3 fatty acids 1000 MG capsule Take 2 g by mouth daily.   isosorbide mononitrate 30 MG 24 hr tablet Commonly known as:  IMDUR Take 0.5 tablets (15 mg total) by mouth every evening.   lidocaine 5 % Commonly known as: LIDODERM Place 1 patch onto the skin daily. Remove & Discard patch within 12 hours or as directed by MD   losartan 50 MG tablet Commonly known as: COZAAR Take 1 tablet (50 mg total) by mouth daily.   meclizine 12.5 MG tablet Commonly known as: ANTIVERT Take 12.5 mg by mouth every 6 (six) hours as needed for dizziness.   memantine 10 MG tablet Commonly known as: NAMENDA Take 1 tablet (10 mg total) by mouth 2 (two) times daily.   nortriptyline 25 MG capsule Commonly known as: PAMELOR Take 25 mg by mouth at bedtime.   senna-docusate 8.6-50 MG tablet Commonly known as: Senokot-S Take 2 tablets by mouth at bedtime.   simvastatin 40 MG tablet Commonly known as: ZOCOR Take 1 tablet (40 mg total) by mouth every evening.   spironolactone 25 MG tablet Commonly known as: ALDACTONE Take 0.5 tablets (12.5 mg total) by mouth daily.   tamsulosin 0.4 MG Caps capsule Commonly known as: FLOMAX Take 1 capsule (0.4 mg total) by mouth daily after supper.       Allergies  Allergen Reactions  . Bee Venom     Listed per Nursing Center-No reaction is noted  . Carbapenems     Listed per Nursing Center-No reaction is noted  . Cephalosporins     Listed per Nursing Center-No  reaction is noted  . Procaine Hcl Other (See Comments)    Numbness/jerkiness  . Sulfonamide Derivatives Other (See Comments)    hypertension  . Penicillins Rash    Consultations:  none   Procedures/Studies: DG Chest 2 View  Result Date: 02/23/2020 CLINICAL DATA:  86 year old female with chest pain, left side ribbon axillary pain. Recent fall. EXAM: CHEST - 2 VIEW COMPARISON:  Chest CT 11/15/2019 and earlier. FINDINGS: Semi upright AP and lateral views of the chest. Stable cardiomegaly. Other mediastinal contours are within normal limits. Visualized tracheal air column is within normal limits. Mild bilateral  increased pulmonary interstitial markings are stable. No pneumothorax, pleural effusion or acute pulmonary opacity. Negative visible bowel gas pattern. Stable cholecystectomy clips. Abdominal Calcified aortic atherosclerosis. Chronic L1 compression fracture. No displaced left rib fracture identified. Stable visualized osseous structures. Asymmetric degeneration of the right AC joint. IMPRESSION: 1. No acute cardiopulmonary abnormality. No displaced left rib fracture identified. 2. Chronic cardiomegaly and pulmonary interstitial changes. Electronically Signed   By: Genevie Ann M.D.   On: 02/23/2020 19:19   CT Chest W Contrast  Result Date: 02/23/2020 CLINICAL DATA:  Rib fracture suspected.  Left-sided rib pain. EXAM: CT CHEST, ABDOMEN, AND PELVIS WITH CONTRAST TECHNIQUE: Multidetector CT imaging of the chest, abdomen and pelvis was performed following the standard protocol during bolus administration of intravenous contrast. CONTRAST:  167mL OMNIPAQUE IOHEXOL 300 MG/ML  SOLN COMPARISON:  CT dated Nov 16, 2019 and Nov 15, 2019 FINDINGS: CT CHEST FINDINGS Cardiovascular: There is cardiomegaly. There is no evidence for thoracic aortic dissection. Atherosclerotic changes are noted of the thoracic aorta. There is no significant pericardial effusion. The arch vessels are patent. Mediastinum/Nodes: -- No mediastinal lymphadenopathy. -- No hilar lymphadenopathy. -- No axillary lymphadenopathy. -- No supraclavicular lymphadenopathy. --the patient is status post right hemithyroidectomy. The visualized portions of the left thyroid gland are unremarkable. -  Unremarkable esophagus. Lungs/Pleura: There is a stable opacity at the right lung apex. There is no pneumothorax. There is a somewhat mosaic appearance of the lung parenchyma bilaterally. There is atelectasis at the lung bases. There are areas of scarring at the lung bases. Musculoskeletal: There is a subacute appearing fracture of the posterior sixth rib on the left, new  since Nov 15, 2019 there is no evidence for an acute displaced left-sided rib fracture. CT ABDOMEN PELVIS FINDINGS Hepatobiliary: The liver is normal. Status post cholecystectomy.There is no biliary ductal dilation. Pancreas: Normal contours without ductal dilatation. No peripancreatic fluid collection. Spleen: There is a new small wedge-shaped hypoattenuating defect in the lower pole of the spleen (axial series 2, image 57). There is no perisplenic hematoma. Adrenals/Urinary Tract: --Adrenal glands: Unremarkable. --Right kidney/ureter: No hydronephrosis or radiopaque kidney stones. --Left kidney/ureter: No hydronephrosis or radiopaque kidney stones. --Urinary bladder: The urinary bladder is significantly distended. There are few pockets of gas within the urinary bladder. Stomach/Bowel: --Stomach/Duodenum: No hiatal hernia or other gastric abnormality. Normal duodenal course and caliber. --Small bowel: Unremarkable. --Colon: Rectosigmoid diverticulosis without acute inflammation. --Appendix: Normal. Vascular/Lymphatic: Atherosclerotic calcification is present within the non-aneurysmal abdominal aorta, without hemodynamically significant stenosis. 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 There is a mild-to-moderate long segment stenosis of the proximal SMA. The IMA is patent. --No retroperitoneal lymphadenopathy. --No mesenteric lymphadenopathy. --No pelvic or inguinal lymphadenopathy. Reproductive: Unremarkable Other: No ascites or free air. The abdominal wall is normal. Musculoskeletal. There is a stable compression fracture of the L1 vertebral body. No new acute displaced lumbar fracture identified on  today's study. IMPRESSION: 1. Subacute appearing fracture of the posterior sixth rib on the left, new since Nov 15, 2019. No pneumothorax. 2. There is a new small wedge-shaped hypoattenuating defect in the lower pole of the spleen. This is new since prior CT in May 2021 and may represent a low-grade  splenic laceration in the setting of trauma or a splenic infarct. There is no significant perisplenic hematoma. 3. 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 There is a mild-to-moderate long segment stenosis of the proximal SMA. The IMA is patent. 4. The urinary bladder is significantly distended. There are few pockets of gas within the urinary bladder. Correlate with recent instrumentation. 5. Cardiomegaly. 6. Mosaic appearance of the lung parenchyma which is nonspecific and can be seen with small airway disease or chronic pulmonary hypertension. Aortic Atherosclerosis (ICD10-I70.0). Electronically Signed   By: Constance Holster M.D.   On: 02/23/2020 21:22   MR ANGIO HEAD WO CONTRAST  Result Date: 01/27/2020 CLINICAL DATA:  Initial evaluation for acute dizziness, hypertension. EXAM: MRI HEAD WITHOUT CONTRAST MRA HEAD WITHOUT CONTRAST TECHNIQUE: Multiplanar, multiecho pulse sequences of the brain and surrounding structures were obtained without intravenous contrast. Angiographic images of the head were obtained using MRA technique without contrast. COMPARISON:  Comparison made with prior head CT from 09/05/2016. FINDINGS: MRI HEAD FINDINGS Brain: Temporal lobe predominant cerebral atrophy noted. Patchy T2/FLAIR hyperintensity within the periventricular and deep white matter both cerebral hemispheres most consistent with chronic small vessel ischemic disease, mild to moderate in nature. Mild patchy involvement of the pons noted. Superimposed small remote lacunar infarct noted at the right basal ganglia. 6 mm focus of diffusion abnormality seen involving the left periatrial white matter (series 3, image 29), consistent with an acute to early subacute small vessel type infarct. No associated hemorrhage or mass effect. No other diffusion abnormality to suggest acute or subacute ischemia. Gray-white matter differentiation otherwise maintained. No encephalomalacia to suggest chronic cortical  infarction. No evidence for acute intracranial hemorrhage. Few scattered punctate foci of susceptibility artifact noted involving the supratentorial brain, consistent with small chronic micro hemorrhages, favored to be hypertensive in nature. No mass lesion, midline shift or mass effect. No hydrocephalus or extra-axial fluid collection. Pituitary gland suprasellar region normal. Midline structures intact. Vascular: Major intracranial vascular flow voids are maintained. Skull and upper cervical spine: Craniocervical junction within normal limits. Upper cervical spine normal. Bone marrow signal intensity within normal limits. No scalp soft tissue abnormality. Sinuses/Orbits: Patient status post bilateral ocular lens replacement. Paranasal sinuses are largely clear. Trace right mastoid effusion noted, of doubtful significance. Inner ear structures grossly normal. Other: None. MRA HEAD FINDINGS ANTERIOR CIRCULATION: Visualized distal cervical segments of the internal carotid arteries are patent with symmetric antegrade flow. Petrous, cavernous, and supraclinoid ICAs widely patent without stenosis or other abnormality. ICA termini well perfused. A1 segments patent bilaterally. Normal anterior communicating artery complex. Anterior cerebral arteries widely patent to their distal aspects without stenosis. No M1 stenosis or occlusion. Normal MCA bifurcations. Distal MCA branches well perfused and symmetric. POSTERIOR CIRCULATION: Vertebral arteries patent to the vertebrobasilar junction without stenosis. Left vertebral artery dominant. Both picas patent proximally. Basilar widely patent to its distal aspect without stenosis. Superior cerebral arteries patent bilaterally. Both PCAs primarily supplied via the basilar and are well perfused or distal aspects without stenosis. Small bilateral posterior communicating arteries noted. No intracranial aneurysm or other vascular abnormality. IMPRESSION: MRI HEAD IMPRESSION: 1. 6 mm  focus of diffusion abnormality involving the left periatrial  white matter, consistent with an acute to early subacute small vessel type infarct. No associated hemorrhage or mass effect. 2. Temporal lobe predominant cerebral atrophy with mild to moderate chronic microvascular ischemic disease. MRA HEAD IMPRESSION: Negative intracranial MRA. No large vessel occlusion. No hemodynamically significant or correctable stenosis. Electronically Signed   By: Jeannine Boga M.D.   On: 01/27/2020 21:54   MR Brain Wo Contrast (neuro protocol)  Result Date: 01/27/2020 CLINICAL DATA:  Initial evaluation for acute dizziness, hypertension. EXAM: MRI HEAD WITHOUT CONTRAST MRA HEAD WITHOUT CONTRAST TECHNIQUE: Multiplanar, multiecho pulse sequences of the brain and surrounding structures were obtained without intravenous contrast. Angiographic images of the head were obtained using MRA technique without contrast. COMPARISON:  Comparison made with prior head CT from 09/05/2016. FINDINGS: MRI HEAD FINDINGS Brain: Temporal lobe predominant cerebral atrophy noted. Patchy T2/FLAIR hyperintensity within the periventricular and deep white matter both cerebral hemispheres most consistent with chronic small vessel ischemic disease, mild to moderate in nature. Mild patchy involvement of the pons noted. Superimposed small remote lacunar infarct noted at the right basal ganglia. 6 mm focus of diffusion abnormality seen involving the left periatrial white matter (series 3, image 29), consistent with an acute to early subacute small vessel type infarct. No associated hemorrhage or mass effect. No other diffusion abnormality to suggest acute or subacute ischemia. Gray-white matter differentiation otherwise maintained. No encephalomalacia to suggest chronic cortical infarction. No evidence for acute intracranial hemorrhage. Few scattered punctate foci of susceptibility artifact noted involving the supratentorial brain, consistent with small  chronic micro hemorrhages, favored to be hypertensive in nature. No mass lesion, midline shift or mass effect. No hydrocephalus or extra-axial fluid collection. Pituitary gland suprasellar region normal. Midline structures intact. Vascular: Major intracranial vascular flow voids are maintained. Skull and upper cervical spine: Craniocervical junction within normal limits. Upper cervical spine normal. Bone marrow signal intensity within normal limits. No scalp soft tissue abnormality. Sinuses/Orbits: Patient status post bilateral ocular lens replacement. Paranasal sinuses are largely clear. Trace right mastoid effusion noted, of doubtful significance. Inner ear structures grossly normal. Other: None. MRA HEAD FINDINGS ANTERIOR CIRCULATION: Visualized distal cervical segments of the internal carotid arteries are patent with symmetric antegrade flow. Petrous, cavernous, and supraclinoid ICAs widely patent without stenosis or other abnormality. ICA termini well perfused. A1 segments patent bilaterally. Normal anterior communicating artery complex. Anterior cerebral arteries widely patent to their distal aspects without stenosis. No M1 stenosis or occlusion. Normal MCA bifurcations. Distal MCA branches well perfused and symmetric. POSTERIOR CIRCULATION: Vertebral arteries patent to the vertebrobasilar junction without stenosis. Left vertebral artery dominant. Both picas patent proximally. Basilar widely patent to its distal aspect without stenosis. Superior cerebral arteries patent bilaterally. Both PCAs primarily supplied via the basilar and are well perfused or distal aspects without stenosis. Small bilateral posterior communicating arteries noted. No intracranial aneurysm or other vascular abnormality. IMPRESSION: MRI HEAD IMPRESSION: 1. 6 mm focus of diffusion abnormality involving the left periatrial white matter, consistent with an acute to early subacute small vessel type infarct. No associated hemorrhage or mass  effect. 2. Temporal lobe predominant cerebral atrophy with mild to moderate chronic microvascular ischemic disease. MRA HEAD IMPRESSION: Negative intracranial MRA. No large vessel occlusion. No hemodynamically significant or correctable stenosis. Electronically Signed   By: Jeannine Boga M.D.   On: 01/27/2020 21:54   CT ABDOMEN PELVIS W CONTRAST  Result Date: 02/23/2020 CLINICAL DATA:  Rib fracture suspected.  Left-sided rib pain. EXAM: CT CHEST, ABDOMEN, AND PELVIS WITH CONTRAST TECHNIQUE: Multidetector  CT imaging of the chest, abdomen and pelvis was performed following the standard protocol during bolus administration of intravenous contrast. CONTRAST:  129mL OMNIPAQUE IOHEXOL 300 MG/ML  SOLN COMPARISON:  CT dated Nov 16, 2019 and Nov 15, 2019 FINDINGS: CT CHEST FINDINGS Cardiovascular: There is cardiomegaly. There is no evidence for thoracic aortic dissection. Atherosclerotic changes are noted of the thoracic aorta. There is no significant pericardial effusion. The arch vessels are patent. Mediastinum/Nodes: -- No mediastinal lymphadenopathy. -- No hilar lymphadenopathy. -- No axillary lymphadenopathy. -- No supraclavicular lymphadenopathy. --the patient is status post right hemithyroidectomy. The visualized portions of the left thyroid gland are unremarkable. -  Unremarkable esophagus. Lungs/Pleura: There is a stable opacity at the right lung apex. There is no pneumothorax. There is a somewhat mosaic appearance of the lung parenchyma bilaterally. There is atelectasis at the lung bases. There are areas of scarring at the lung bases. Musculoskeletal: There is a subacute appearing fracture of the posterior sixth rib on the left, new since Nov 15, 2019 there is no evidence for an acute displaced left-sided rib fracture. CT ABDOMEN PELVIS FINDINGS Hepatobiliary: The liver is normal. Status post cholecystectomy.There is no biliary ductal dilation. Pancreas: Normal contours without ductal dilatation. No  peripancreatic fluid collection. Spleen: There is a new small wedge-shaped hypoattenuating defect in the lower pole of the spleen (axial series 2, image 57). There is no perisplenic hematoma. Adrenals/Urinary Tract: --Adrenal glands: Unremarkable. --Right kidney/ureter: No hydronephrosis or radiopaque kidney stones. --Left kidney/ureter: No hydronephrosis or radiopaque kidney stones. --Urinary bladder: The urinary bladder is significantly distended. There are few pockets of gas within the urinary bladder. Stomach/Bowel: --Stomach/Duodenum: No hiatal hernia or other gastric abnormality. Normal duodenal course and caliber. --Small bowel: Unremarkable. --Colon: Rectosigmoid diverticulosis without acute inflammation. --Appendix: Normal. Vascular/Lymphatic: Atherosclerotic calcification is present within the non-aneurysmal abdominal aorta, without hemodynamically significant stenosis. 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 There is a mild-to-moderate long segment stenosis of the proximal SMA. The IMA is patent. --No retroperitoneal lymphadenopathy. --No mesenteric lymphadenopathy. --No pelvic or inguinal lymphadenopathy. Reproductive: Unremarkable Other: No ascites or free air. The abdominal wall is normal. Musculoskeletal. There is a stable compression fracture of the L1 vertebral body. No new acute displaced lumbar fracture identified on today's study. IMPRESSION: 1. Subacute appearing fracture of the posterior sixth rib on the left, new since Nov 15, 2019. No pneumothorax. 2. There is a new small wedge-shaped hypoattenuating defect in the lower pole of the spleen. This is new since prior CT in May 2021 and may represent a low-grade splenic laceration in the setting of trauma or a splenic infarct. There is no significant perisplenic hematoma. 3. 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 There is a mild-to-moderate long segment stenosis of the proximal SMA. The IMA is patent. 4. The  urinary bladder is significantly distended. There are few pockets of gas within the urinary bladder. Correlate with recent instrumentation. 5. Cardiomegaly. 6. Mosaic appearance of the lung parenchyma which is nonspecific and can be seen with small airway disease or chronic pulmonary hypertension. Aortic Atherosclerosis (ICD10-I70.0). Electronically Signed   By: Constance Holster M.D.   On: 02/23/2020 21:22   VAS US CAROTID  Result Date: 01/30/2020 Carotid Arterial Duplex Study Indications:       CVA and Dizziness. Risk Factors:      Hypertension, hyperlipidemia, Diabetes. Other Factors:     Atrial fibrillation. Comparison Study:  No prior study on file Performing Technologist: Sharion Dove RVS  Examination Guidelines: A complete evaluation includes B-mode imaging, spectral Doppler, color Doppler, and power Doppler as needed of all accessible portions of each vessel. Bilateral testing is considered an integral part of a complete examination. Limited examinations for reoccurring indications may be performed as noted.  Right Carotid Findings: +----------+--------+--------+--------+------------------+------------------+           PSV cm/sEDV cm/sStenosisPlaque DescriptionComments           +----------+--------+--------+--------+------------------+------------------+ CCA Prox  57      6                                 intimal thickening +----------+--------+--------+--------+------------------+------------------+ CCA Distal69      15                                intimal thickening +----------+--------+--------+--------+------------------+------------------+ ICA Prox  43      13              heterogenous      tortuous           +----------+--------+--------+--------+------------------+------------------+ ICA Distal51      12                                tortuous           +----------+--------+--------+--------+------------------+------------------+ ECA       40      2                                                     +----------+--------+--------+--------+------------------+------------------+ +----------+--------+-------+--------+-------------------+           PSV cm/sEDV cmsDescribeArm Pressure (mmHG) +----------+--------+-------+--------+-------------------+ [id number]                                         +----------+--------+-------+--------+-------------------+ +---------+--------+--+--------+-+ VertebralPSV cm/s21EDV cm/s5 +---------+--------+--+--------+-+  Left Carotid Findings: +----------+--------+--------+--------+------------------+------------------+           PSV cm/sEDV cm/sStenosisPlaque DescriptionComments           +----------+--------+--------+--------+------------------+------------------+ CCA Prox  80      11                                intimal thickening +----------+--------+--------+--------+------------------+------------------+ CCA Distal58      15                                intimal thickening +----------+--------+--------+--------+------------------+------------------+ ICA Prox  42      13              heterogenous                         +----------+--------+--------+--------+------------------+------------------+ ICA Distal31      9                                                    +----------+--------+--------+--------+------------------+------------------+  ECA       65      15                                                   +----------+--------+--------+--------+------------------+------------------+ +----------+--------+--------+--------+-------------------+           PSV cm/sEDV cm/sDescribeArm Pressure (mmHG) +----------+--------+--------+--------+-------------------+ Subclavian100                                         +----------+--------+--------+--------+-------------------+ +---------+--------+--+--------+--+ VertebralPSV cm/s36EDV cm/s12  +---------+--------+--+--------+--+   Summary: Right Carotid: The extracranial vessels were near-normal with only minimal wall                thickening or plaque. Left Carotid: The extracranial vessels were near-normal with only minimal wall               thickening or plaque. Vertebrals:  Bilateral vertebral arteries demonstrate antegrade flow. Subclavians: Normal flow hemodynamics were seen in bilateral subclavian              arteries. *See table(s) above for measurements and observations.  Electronically signed by Antony Contras MD on 01/30/2020 at 11:07:46 AM.    Final         Discharge Exam: Vitals:   02/24/20 2053 02/25/20 0516  BP: (!) 136/51 (!) 171/69  Pulse: 66 61  Resp: 15 16  Temp: 98.6 F (37 C) 97.9 F (36.6 C)  SpO2: 95% 93%   Vitals:   02/24/20 1800 02/24/20 2053 02/25/20 0513 02/25/20 0516  BP: (!) 182/93 (!) 136/51  (!) 171/69  Pulse: 91 66  61  Resp: 18 15  16   Temp: 98.1 F (36.7 C) 98.6 F (37 C)  97.9 F (36.6 C)  TempSrc: Oral     SpO2: 98% 95%  93%  Weight:   65.2 kg   Height:        General: Pt is alert, awake, not in acute distress Cardiovascular: RRR, S1/S2 +, no rubs, no gallops Respiratory: bibasilar crackles. No wheeze Abdominal: Soft, NT, ND, bowel sounds + Extremities: no edema, no cyanosis   The results of significant diagnostics from this hospitalization (including imaging, microbiology, ancillary and laboratory) are listed below for reference.    Significant Diagnostic Studies: DG Chest 2 View  Result Date: 02/23/2020 CLINICAL DATA:  86 year old female with chest pain, left side ribbon axillary pain. Recent fall. EXAM: CHEST - 2 VIEW COMPARISON:  Chest CT 11/15/2019 and earlier. FINDINGS: Semi upright AP and lateral views of the chest. Stable cardiomegaly. Other mediastinal contours are within normal limits. Visualized tracheal air column is within normal limits. Mild bilateral increased pulmonary interstitial markings are stable. No  pneumothorax, pleural effusion or acute pulmonary opacity. Negative visible bowel gas pattern. Stable cholecystectomy clips. Abdominal Calcified aortic atherosclerosis. Chronic L1 compression fracture. No displaced left rib fracture identified. Stable visualized osseous structures. Asymmetric degeneration of the right AC joint. IMPRESSION: 1. No acute cardiopulmonary abnormality. No displaced left rib fracture identified. 2. Chronic cardiomegaly and pulmonary interstitial changes. Electronically Signed   By: Genevie Ann M.D.   On: 02/23/2020 19:19   CT Chest W Contrast  Result Date: 02/23/2020 CLINICAL DATA:  Rib fracture suspected.  Left-sided rib pain. EXAM: CT CHEST, ABDOMEN, AND PELVIS WITH CONTRAST  TECHNIQUE: Multidetector CT imaging of the chest, abdomen and pelvis was performed following the standard protocol during bolus administration of intravenous contrast. CONTRAST:  171mL OMNIPAQUE IOHEXOL 300 MG/ML  SOLN COMPARISON:  CT dated Nov 16, 2019 and Nov 15, 2019 FINDINGS: CT CHEST FINDINGS Cardiovascular: There is cardiomegaly. There is no evidence for thoracic aortic dissection. Atherosclerotic changes are noted of the thoracic aorta. There is no significant pericardial effusion. The arch vessels are patent. Mediastinum/Nodes: -- No mediastinal lymphadenopathy. -- No hilar lymphadenopathy. -- No axillary lymphadenopathy. -- No supraclavicular lymphadenopathy. --the patient is status post right hemithyroidectomy. The visualized portions of the left thyroid gland are unremarkable. -  Unremarkable esophagus. Lungs/Pleura: There is a stable opacity at the right lung apex. There is no pneumothorax. There is a somewhat mosaic appearance of the lung parenchyma bilaterally. There is atelectasis at the lung bases. There are areas of scarring at the lung bases. Musculoskeletal: There is a subacute appearing fracture of the posterior sixth rib on the left, new since Nov 15, 2019 there is no evidence for an acute displaced  left-sided rib fracture. CT ABDOMEN PELVIS FINDINGS Hepatobiliary: The liver is normal. Status post cholecystectomy.There is no biliary ductal dilation. Pancreas: Normal contours without ductal dilatation. No peripancreatic fluid collection. Spleen: There is a new small wedge-shaped hypoattenuating defect in the lower pole of the spleen (axial series 2, image 57). There is no perisplenic hematoma. Adrenals/Urinary Tract: --Adrenal glands: Unremarkable. --Right kidney/ureter: No hydronephrosis or radiopaque kidney stones. --Left kidney/ureter: No hydronephrosis or radiopaque kidney stones. --Urinary bladder: The urinary bladder is significantly distended. There are few pockets of gas within the urinary bladder. Stomach/Bowel: --Stomach/Duodenum: No hiatal hernia or other gastric abnormality. Normal duodenal course and caliber. --Small bowel: Unremarkable. --Colon: Rectosigmoid diverticulosis without acute inflammation. --Appendix: Normal. Vascular/Lymphatic: Atherosclerotic calcification is present within the non-aneurysmal abdominal aorta, without hemodynamically significant stenosis. 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 There is a mild-to-moderate long segment stenosis of the proximal SMA. The IMA is patent. --No retroperitoneal lymphadenopathy. --No mesenteric lymphadenopathy. --No pelvic or inguinal lymphadenopathy. Reproductive: Unremarkable Other: No ascites or free air. The abdominal wall is normal. Musculoskeletal. There is a stable compression fracture of the L1 vertebral body. No new acute displaced lumbar fracture identified on today's study. IMPRESSION: 1. Subacute appearing fracture of the posterior sixth rib on the left, new since Nov 15, 2019. No pneumothorax. 2. There is a new small wedge-shaped hypoattenuating defect in the lower pole of the spleen. This is new since prior CT in May 2021 and may represent a low-grade splenic laceration in the setting of trauma or a splenic  infarct. There is no significant perisplenic hematoma. 3. 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 There is a mild-to-moderate long segment stenosis of the proximal SMA. The IMA is patent. 4. The urinary bladder is significantly distended. There are few pockets of gas within the urinary bladder. Correlate with recent instrumentation. 5. Cardiomegaly. 6. Mosaic appearance of the lung parenchyma which is nonspecific and can be seen with small airway disease or chronic pulmonary hypertension. Aortic Atherosclerosis (ICD10-I70.0). Electronically Signed   By: Constance Holster M.D.   On: 02/23/2020 21:22   MR ANGIO HEAD WO CONTRAST  Result Date: 01/27/2020 CLINICAL DATA:  Initial evaluation for acute dizziness, hypertension. EXAM: MRI HEAD WITHOUT CONTRAST MRA HEAD WITHOUT CONTRAST TECHNIQUE: Multiplanar, multiecho pulse sequences of the brain and surrounding structures were obtained without intravenous contrast. Angiographic images of the head were obtained using  MRA technique without contrast. COMPARISON:  Comparison made with prior head CT from 09/05/2016. FINDINGS: MRI HEAD FINDINGS Brain: Temporal lobe predominant cerebral atrophy noted. Patchy T2/FLAIR hyperintensity within the periventricular and deep white matter both cerebral hemispheres most consistent with chronic small vessel ischemic disease, mild to moderate in nature. Mild patchy involvement of the pons noted. Superimposed small remote lacunar infarct noted at the right basal ganglia. 6 mm focus of diffusion abnormality seen involving the left periatrial white matter (series 3, image 29), consistent with an acute to early subacute small vessel type infarct. No associated hemorrhage or mass effect. No other diffusion abnormality to suggest acute or subacute ischemia. Gray-white matter differentiation otherwise maintained. No encephalomalacia to suggest chronic cortical infarction. No evidence for acute intracranial hemorrhage. Few  scattered punctate foci of susceptibility artifact noted involving the supratentorial brain, consistent with small chronic micro hemorrhages, favored to be hypertensive in nature. No mass lesion, midline shift or mass effect. No hydrocephalus or extra-axial fluid collection. Pituitary gland suprasellar region normal. Midline structures intact. Vascular: Major intracranial vascular flow voids are maintained. Skull and upper cervical spine: Craniocervical junction within normal limits. Upper cervical spine normal. Bone marrow signal intensity within normal limits. No scalp soft tissue abnormality. Sinuses/Orbits: Patient status post bilateral ocular lens replacement. Paranasal sinuses are largely clear. Trace right mastoid effusion noted, of doubtful significance. Inner ear structures grossly normal. Other: None. MRA HEAD FINDINGS ANTERIOR CIRCULATION: Visualized distal cervical segments of the internal carotid arteries are patent with symmetric antegrade flow. Petrous, cavernous, and supraclinoid ICAs widely patent without stenosis or other abnormality. ICA termini well perfused. A1 segments patent bilaterally. Normal anterior communicating artery complex. Anterior cerebral arteries widely patent to their distal aspects without stenosis. No M1 stenosis or occlusion. Normal MCA bifurcations. Distal MCA branches well perfused and symmetric. POSTERIOR CIRCULATION: Vertebral arteries patent to the vertebrobasilar junction without stenosis. Left vertebral artery dominant. Both picas patent proximally. Basilar widely patent to its distal aspect without stenosis. Superior cerebral arteries patent bilaterally. Both PCAs primarily supplied via the basilar and are well perfused or distal aspects without stenosis. Small bilateral posterior communicating arteries noted. No intracranial aneurysm or other vascular abnormality. IMPRESSION: MRI HEAD IMPRESSION: 1. 6 mm focus of diffusion abnormality involving the left periatrial  white matter, consistent with an acute to early subacute small vessel type infarct. No associated hemorrhage or mass effect. 2. Temporal lobe predominant cerebral atrophy with mild to moderate chronic microvascular ischemic disease. MRA HEAD IMPRESSION: Negative intracranial MRA. No large vessel occlusion. No hemodynamically significant or correctable stenosis. Electronically Signed   By: Jeannine Boga M.D.   On: 01/27/2020 21:54   MR Brain Wo Contrast (neuro protocol)  Result Date: 01/27/2020 CLINICAL DATA:  Initial evaluation for acute dizziness, hypertension. EXAM: MRI HEAD WITHOUT CONTRAST MRA HEAD WITHOUT CONTRAST TECHNIQUE: Multiplanar, multiecho pulse sequences of the brain and surrounding structures were obtained without intravenous contrast. Angiographic images of the head were obtained using MRA technique without contrast. COMPARISON:  Comparison made with prior head CT from 09/05/2016. FINDINGS: MRI HEAD FINDINGS Brain: Temporal lobe predominant cerebral atrophy noted. Patchy T2/FLAIR hyperintensity within the periventricular and deep white matter both cerebral hemispheres most consistent with chronic small vessel ischemic disease, mild to moderate in nature. Mild patchy involvement of the pons noted. Superimposed small remote lacunar infarct noted at the right basal ganglia. 6 mm focus of diffusion abnormality seen involving the left periatrial white matter (series 3, image 29), consistent with an acute to early subacute small  vessel type infarct. No associated hemorrhage or mass effect. No other diffusion abnormality to suggest acute or subacute ischemia. Gray-white matter differentiation otherwise maintained. No encephalomalacia to suggest chronic cortical infarction. No evidence for acute intracranial hemorrhage. Few scattered punctate foci of susceptibility artifact noted involving the supratentorial brain, consistent with small chronic micro hemorrhages, favored to be hypertensive in  nature. No mass lesion, midline shift or mass effect. No hydrocephalus or extra-axial fluid collection. Pituitary gland suprasellar region normal. Midline structures intact. Vascular: Major intracranial vascular flow voids are maintained. Skull and upper cervical spine: Craniocervical junction within normal limits. Upper cervical spine normal. Bone marrow signal intensity within normal limits. No scalp soft tissue abnormality. Sinuses/Orbits: Patient status post bilateral ocular lens replacement. Paranasal sinuses are largely clear. Trace right mastoid effusion noted, of doubtful significance. Inner ear structures grossly normal. Other: None. MRA HEAD FINDINGS ANTERIOR CIRCULATION: Visualized distal cervical segments of the internal carotid arteries are patent with symmetric antegrade flow. Petrous, cavernous, and supraclinoid ICAs widely patent without stenosis or other abnormality. ICA termini well perfused. A1 segments patent bilaterally. Normal anterior communicating artery complex. Anterior cerebral arteries widely patent to their distal aspects without stenosis. No M1 stenosis or occlusion. Normal MCA bifurcations. Distal MCA branches well perfused and symmetric. POSTERIOR CIRCULATION: Vertebral arteries patent to the vertebrobasilar junction without stenosis. Left vertebral artery dominant. Both picas patent proximally. Basilar widely patent to its distal aspect without stenosis. Superior cerebral arteries patent bilaterally. Both PCAs primarily supplied via the basilar and are well perfused or distal aspects without stenosis. Small bilateral posterior communicating arteries noted. No intracranial aneurysm or other vascular abnormality. IMPRESSION: MRI HEAD IMPRESSION: 1. 6 mm focus of diffusion abnormality involving the left periatrial white matter, consistent with an acute to early subacute small vessel type infarct. No associated hemorrhage or mass effect. 2. Temporal lobe predominant cerebral atrophy with  mild to moderate chronic microvascular ischemic disease. MRA HEAD IMPRESSION: Negative intracranial MRA. No large vessel occlusion. No hemodynamically significant or correctable stenosis. Electronically Signed   By: Jeannine Boga M.D.   On: 01/27/2020 21:54   CT ABDOMEN PELVIS W CONTRAST  Result Date: 02/23/2020 CLINICAL DATA:  Rib fracture suspected.  Left-sided rib pain. EXAM: CT CHEST, ABDOMEN, AND PELVIS WITH CONTRAST TECHNIQUE: Multidetector CT imaging of the chest, abdomen and pelvis was performed following the standard protocol during bolus administration of intravenous contrast. CONTRAST:  157mL OMNIPAQUE IOHEXOL 300 MG/ML  SOLN COMPARISON:  CT dated Nov 16, 2019 and Nov 15, 2019 FINDINGS: CT CHEST FINDINGS Cardiovascular: There is cardiomegaly. There is no evidence for thoracic aortic dissection. Atherosclerotic changes are noted of the thoracic aorta. There is no significant pericardial effusion. The arch vessels are patent. Mediastinum/Nodes: -- No mediastinal lymphadenopathy. -- No hilar lymphadenopathy. -- No axillary lymphadenopathy. -- No supraclavicular lymphadenopathy. --the patient is status post right hemithyroidectomy. The visualized portions of the left thyroid gland are unremarkable. -  Unremarkable esophagus. Lungs/Pleura: There is a stable opacity at the right lung apex. There is no pneumothorax. There is a somewhat mosaic appearance of the lung parenchyma bilaterally. There is atelectasis at the lung bases. There are areas of scarring at the lung bases. Musculoskeletal: There is a subacute appearing fracture of the posterior sixth rib on the left, new since Nov 15, 2019 there is no evidence for an acute displaced left-sided rib fracture. CT ABDOMEN PELVIS FINDINGS Hepatobiliary: The liver is normal. Status post cholecystectomy.There is no biliary ductal dilation. Pancreas: Normal contours without ductal dilatation. No peripancreatic  fluid collection. Spleen: There is a new small  wedge-shaped hypoattenuating defect in the lower pole of the spleen (axial series 2, image 57). There is no perisplenic hematoma. Adrenals/Urinary Tract: --Adrenal glands: Unremarkable. --Right kidney/ureter: No hydronephrosis or radiopaque kidney stones. --Left kidney/ureter: No hydronephrosis or radiopaque kidney stones. --Urinary bladder: The urinary bladder is significantly distended. There are few pockets of gas within the urinary bladder. Stomach/Bowel: --Stomach/Duodenum: No hiatal hernia or other gastric abnormality. Normal duodenal course and caliber. --Small bowel: Unremarkable. --Colon: Rectosigmoid diverticulosis without acute inflammation. --Appendix: Normal. Vascular/Lymphatic: Atherosclerotic calcification is present within the non-aneurysmal abdominal aorta, without hemodynamically significant stenosis. 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 There is a mild-to-moderate long segment stenosis of the proximal SMA. The IMA is patent. --No retroperitoneal lymphadenopathy. --No mesenteric lymphadenopathy. --No pelvic or inguinal lymphadenopathy. Reproductive: Unremarkable Other: No ascites or free air. The abdominal wall is normal. Musculoskeletal. There is a stable compression fracture of the L1 vertebral body. No new acute displaced lumbar fracture identified on today's study. IMPRESSION: 1. Subacute appearing fracture of the posterior sixth rib on the left, new since Nov 15, 2019. No pneumothorax. 2. There is a new small wedge-shaped hypoattenuating defect in the lower pole of the spleen. This is new since prior CT in May 2021 and may represent a low-grade splenic laceration in the setting of trauma or a splenic infarct. There is no significant perisplenic hematoma. 3. 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 There is a mild-to-moderate long segment stenosis of the proximal SMA. The IMA is patent. 4. The urinary bladder is significantly distended. There are few  pockets of gas within the urinary bladder. Correlate with recent instrumentation. 5. Cardiomegaly. 6. Mosaic appearance of the lung parenchyma which is nonspecific and can be seen with small airway disease or chronic pulmonary hypertension. Aortic Atherosclerosis (ICD10-I70.0). Electronically Signed   By: Constance Holster M.D.   On: 02/23/2020 21:22   VAS US CAROTID  Result Date: 01/30/2020 Carotid Arterial Duplex Study Indications:       CVA and Dizziness. Risk Factors:      Hypertension, hyperlipidemia, Diabetes. Other Factors:     Atrial fibrillation. Comparison Study:  No prior study on file Performing Technologist: Sharion Dove RVS  Examination Guidelines: A complete evaluation includes B-mode imaging, spectral Doppler, color Doppler, and power Doppler as needed of all accessible portions of each vessel. Bilateral testing is considered an integral part of a complete examination. Limited examinations for reoccurring indications may be performed as noted.  Right Carotid Findings: +----------+--------+--------+--------+------------------+------------------+           PSV cm/sEDV cm/sStenosisPlaque DescriptionComments           +----------+--------+--------+--------+------------------+------------------+ CCA Prox  57      6                                 intimal thickening +----------+--------+--------+--------+------------------+------------------+ CCA Distal69      15                                intimal thickening +----------+--------+--------+--------+------------------+------------------+ ICA Prox  43      13              heterogenous      tortuous           +----------+--------+--------+--------+------------------+------------------+ ICA Distal51      12  tortuous           +----------+--------+--------+--------+------------------+------------------+ ECA       40      2                                                     +----------+--------+--------+--------+------------------+------------------+ +----------+--------+-------+--------+-------------------+           PSV cm/sEDV cmsDescribeArm Pressure (mmHG) +----------+--------+-------+--------+-------------------+ [id number]                                         +----------+--------+-------+--------+-------------------+ +---------+--------+--+--------+-+ VertebralPSV cm/s21EDV cm/s5 +---------+--------+--+--------+-+  Left Carotid Findings: +----------+--------+--------+--------+------------------+------------------+           PSV cm/sEDV cm/sStenosisPlaque DescriptionComments           +----------+--------+--------+--------+------------------+------------------+ CCA Prox  80      11                                intimal thickening +----------+--------+--------+--------+------------------+------------------+ CCA Distal58      15                                intimal thickening +----------+--------+--------+--------+------------------+------------------+ ICA Prox  42      13              heterogenous                         +----------+--------+--------+--------+------------------+------------------+ ICA Distal31      9                                                    +----------+--------+--------+--------+------------------+------------------+ ECA       65      15                                                   +----------+--------+--------+--------+------------------+------------------+ +----------+--------+--------+--------+-------------------+           PSV cm/sEDV cm/sDescribeArm Pressure (mmHG) +----------+--------+--------+--------+-------------------+ Subclavian100                                         +----------+--------+--------+--------+-------------------+ +---------+--------+--+--------+--+ VertebralPSV cm/s36EDV cm/s12 +---------+--------+--+--------+--+   Summary: Right Carotid:  The extracranial vessels were near-normal with only minimal wall                thickening or plaque. Left Carotid: The extracranial vessels were near-normal with only minimal wall               thickening or plaque. Vertebrals:  Bilateral vertebral arteries demonstrate antegrade flow. Subclavians: Normal flow hemodynamics were seen in bilateral subclavian              arteries. *See table(s) above for measurements and observations.  Electronically signed by  Antony Contras MD on 01/30/2020 at 11:07:46 AM.    Final      Microbiology: Recent Results (from the past 240 hour(s))  SARS Coronavirus 2 by RT PCR (hospital order, performed in [facility] hospital lab) Nasopharyngeal Nasopharyngeal Swab     Status: None   Collection Time: 02/24/20  3:21 AM   Specimen: Nasopharyngeal Swab  Result Value Ref Range Status   SARS Coronavirus 2 NEGATIVE NEGATIVE Final    Comment: (NOTE) SARS-CoV-2 target nucleic acids are NOT DETECTED.  The SARS-CoV-2 RNA is generally detectable in upper and lower respiratory specimens during the acute phase of infection. The lowest concentration of SARS-CoV-2 viral copies this assay can detect is 250 copies / mL. A negative result does not preclude SARS-CoV-2 infection and should not be used as the sole basis for treatment or other patient management decisions.  A negative result may occur with improper specimen collection / handling, submission of specimen other than nasopharyngeal swab, presence of viral mutation(s) within the areas targeted by this assay, and inadequate number of viral copies (<250 copies / mL). A negative result must be combined with clinical observations, patient history, and epidemiological information.  Fact Sheet for Patients:   StrictlyIdeas.no  Fact Sheet for Healthcare Providers: BankingDealers.co.za  This test is not yet approved or  cleared by the [place] FDA and has been authorized for  detection and/or diagnosis of SARS-CoV-2 by FDA under an Emergency Use Authorization (EUA).  This EUA will remain in effect (meaning this test can be used) for the duration of the COVID-19 declaration under Section 564(b)(1) of the Act, 21 U.S.C. section 360bbb-3(b)(1), unless the authorization is terminated or revoked sooner.  Performed at [facility], [address], [place], [place] [address]      Labs: Basic Metabolic Panel: Recent Labs  Lab 02/23/20 1830 02/23/20 1830 02/24/20 0509 02/25/20 0529  NA 140  --  141 140  K 4.1   < > 3.6 3.4*  CL 103  --  104 104  CO2 28  --  27 25  GLUCOSE 147*  --  121* 102*  BUN 11  --  12 9  CREATININE 0.58  --  0.58 0.58  CALCIUM 9.1  --  9.0 8.6*  MG  --   --  2.0 2.0  PHOS  --   --  3.8  --    < > = values in this interval not displayed.   Liver Function Tests: Recent Labs  Lab 02/24/20 0509  AST 15  ALT 15  ALKPHOS 47  BILITOT 0.7  PROT 6.2*  ALBUMIN 3.7   Recent Labs  Lab 02/23/20 1830  LIPASE 21   No results for input(s): AMMONIA in the last 168 hours. CBC: Recent Labs  Lab 02/23/20 1830 02/24/20 0509 02/25/20 0529  WBC 8.2 8.6 8.7  NEUTROABS 4.0  --   --   HGB 11.0* 10.2* 10.0*  HCT 34.7* 32.2* 31.7*  MCV 71.8* 71.9* 71.6*  PLT 242 227 225   Cardiac Enzymes: No results for input(s): CKTOTAL, CKMB, CKMBINDEX, TROPONINI in the last 168 hours. BNP: Invalid input(s): POCBNP CBG: No results for input(s): GLUCAP in the last 168 hours.  Time coordinating discharge:  36 minutes  Signed:  Orson Eva, DO [facility] Pager: [phone] 02/25/2020, 12:24 PM

## 2020-05-01 NOTE — Progress Notes (Signed)
Virtual Visit via Telephone Note   This visit type was conducted due to national recommendations for restrictions regarding the COVID-19 Pandemic (e.g. social distancing) in an effort to limit this patient's exposure and mitigate transmission in our community.  Due to her co-morbid illnesses, this patient is at least at moderate risk for complications without adequate follow up.  This format is felt to be most appropriate for this patient at this time.  The patient did not have access to video technology/had technical difficulties with video requiring transitioning to audio format only (telephone).  All issues noted in this document were discussed and addressed.  No physical exam could be performed with this format.  Please refer to the patient's chart for her  consent to telehealth for [facility].    Date:  05/02/2020   ID:  [name], DOB [birth year], MRN [id number] The patient was identified using 2 identifiers.  Patient Location: [place] Provider Location: Office/Clinic  PCP:  Tilda Burrow, NP  Cardiologist:  Rozann Lesches, MD  Electrophysiologist:  None   Evaluation Performed:  Follow-Up Visit  Chief Complaint:  4 month visit  History of Present Illness:    [name] is a 86 y.o. female with past medical history of chronic combined systolic and diastolic CHF/presumed ischemic cardiomyopathy (EF 35-40% by echo in 10/2019 in the setting of a NSTEMI and medical management pursued), paroxysmal atrial fibrillation (not on anticoagulation given dementia, fall risk and no recent recurrence), AAA (at 4 cm by imaging in 10/2019), HTN, HLD and dementia who presents for a 36-month follow-up telehealth visit.   She most recently had a telehealth visit with myself in 12/2019 as she was residing at [place] but was overall doing well and denied any recent anginal symptoms. Was working with PT several days a week. Was continued on her current  medication regimen at that time including Coreg, Imdur, Losartan and Spironolactone.   In the interim, she was admitted to [facility] in 02/2020 for hypertensive urgency and a mechanical fall. Initially required IV medications but was transitioned back to her PTA PO regimen during admission. She was found to have a splenic laceration during admission and General Surgery recommended nonoperative management.   In talking with the patient today, she denies any recent chest pain or dyspnea on exertion. She is participating in daily exercise classes. She is unable to elaborate on a majority of her history given her dementia but her caretaker says she has not reported any recent symptoms. She has overall been doing well since her last admission. No recurrent falls. No reports of orthopnea, PND or lower extremity edema. She does experience dizziness with positional changes which has been occurring for several weeks per their report. Her caretaker does not believe orthostatic vitals have been obtained.  The patient does not have symptoms concerning for COVID-19 infection (fever, chills, cough, or new shortness of breath).    Past Medical History:  Diagnosis Date  . Anemia   . Atrial fibrillation ([place])   . Cardiomyopathy ([place])    a. 10/2019: EF 35-40% by echo --> medical management recommended.   . Dementia ([place])   . Dementia ([place])   . Diverticulitis 8/09, 3/10   Abscess in 2009 (medical tx)  . Essential hypertension   . Hyperlipidemia   . Hyperlipidemia   . Hyperthyroidism    Radiation, surgery  . IDA (iron deficiency anemia)   . Osteoporosis   . Postoperative anemia due to acute  blood loss 09/08/2016  . Thalassemia   . Tubular adenoma of colon 12/15/08   Colonosocpy Dr Tonita Cong removed cecum  . Type 2 diabetes mellitus ([place])    Past Surgical History:  Procedure Laterality Date  . CESAREAN SECTION     x2  . CHOLECYSTECTOMY    . FOOT SURGERY     bilat  . INTRAMEDULLARY (IM) NAIL  INTERTROCHANTERIC Left 09/06/2016   Procedure: LEFT HIP INTRAMEDULLARY (IM) NAIL;  Surgeon: Renette Butters, MD;  Location: [place] [facility];  Service: Orthopedics;  Laterality: Left;  . THYROIDECTOMY, PARTIAL       Current Meds  Medication Sig  . acetaminophen (TYLENOL) 325 MG tablet Take 2 tablets (650 mg total) by mouth every 6 (six) hours as needed for mild pain, fever or headache.  Marland Kitchen acetaminophen (TYLENOL) 325 MG tablet Take 2 tablets (650 mg total) by mouth every 6 (six) hours as needed for mild pain (or Fever >/= 101).  Marland Kitchen alendronate (FOSAMAX) 70 MG tablet Take 70 mg by mouth every Wednesday. Take with a full glass of water on an empty stomach.  Marland Kitchen aspirin EC 325 MG EC tablet Take 1 tablet (325 mg total) by mouth daily.  . calcium citrate-vitamin D 500-400 MG-UNIT chewable tablet Chew by mouth 2 (two) times daily.  . carvedilol (COREG) 6.25 MG tablet Take 1 tablet (6.25 mg total) by mouth 2 (two) times daily with a meal.  . Cranberry 450 MG TABS Take 450 mg by mouth 2 (two) times daily.  . cycloSPORINE (RESTASIS) 0.05 % ophthalmic emulsion Place 1 drop into both eyes 2 (two) times daily.  Marland Kitchen donepezil (ARICEPT) 5 MG tablet Take 1 tablet (5 mg total) by mouth at bedtime.  . ferrous sulfate 325 (65 FE) MG tablet Take 325 mg by mouth in the morning and at bedtime.  . fish oil-omega-3 fatty acids 1000 MG capsule Take 2 g by mouth daily.    . isosorbide mononitrate (IMDUR) 30 MG 24 hr tablet Take 0.5 tablets (15 mg total) by mouth every evening.  Marland Kitchen losartan (COZAAR) 50 MG tablet Take 1 tablet (50 mg total) by mouth daily.  . meclizine (ANTIVERT) 12.5 MG tablet Take 12.5 mg by mouth every 6 (six) hours as needed for dizziness.  . memantine (NAMENDA) 10 MG tablet Take 1 tablet (10 mg total) by mouth 2 (two) times daily.  . nortriptyline (PAMELOR) 25 MG capsule Take 25 mg by mouth at bedtime.  . senna-docusate (SENOKOT-S) 8.6-50 MG tablet Take 2 tablets by mouth at bedtime.  . simvastatin (ZOCOR) 40 MG  tablet Take 1 tablet (40 mg total) by mouth every evening.  Marland Kitchen spironolactone (ALDACTONE) 25 MG tablet Take 0.5 tablets (12.5 mg total) by mouth daily.  . tamsulosin (FLOMAX) 0.4 MG CAPS capsule Take 1 capsule (0.4 mg total) by mouth daily after supper.     Allergies:   Bee venom, Carbapenems, Cephalosporins, Procaine hcl, Sulfonamide derivatives, and Penicillins   Social History   Tobacco Use  . Smoking status: Never Smoker  . Smokeless tobacco: Never Used  Substance Use Topics  . Alcohol use: Never  . Drug use: No     Family Hx: The patient's family history includes Colon cancer (age of onset: 59) in her mother; Coronary artery disease in her mother; Pneumonia in her father; Stroke in her mother; Thalassemia in her sister.  ROS:   Please see the history of present illness.     All other systems reviewed and are negative.  Prior CV studies:   The following studies were reviewed today:  Echocardiogram: 11/2019 IMPRESSIONS    1. Left ventricular ejection fraction, by estimation, is 35 to 40%. The  left ventricle has moderately decreased function. The left ventricle  demonstrates global hypokinesis. The left ventricular internal cavity size  was mildly dilated. There is mild  left ventricular hypertrophy. Left ventricular diastolic parameters are  consistent with Grade I diastolic dysfunction (impaired relaxation).  2. Right ventricular systolic function is normal. The right ventricular  size is normal. Tricuspid regurgitation signal is inadequate for assessing  PA pressure.  3. Left atrial size was moderately dilated.  4. The mitral valve is grossly normal. Mild mitral valve regurgitation.  5. The aortic valve is tricuspid. Aortic valve regurgitation is mild.  6. The inferior vena cava is normal in size with greater than 50%  respiratory variability, suggesting right atrial pressure of 3 mmHg.    Labs/Other Tests and Data Reviewed:    EKG:  An ECG dated  02/23/2020 was personally reviewed today and demonstrated:  NSR, HR 74 with nonspecific ST abnormality along the lateral leads. No acute ST changes when compared to prior tracings.   Recent Labs: 02/24/2020: ALT 15 02/25/2020: BUN 9; Creatinine, Ser 0.58; Hemoglobin 10.0; Magnesium 2.0; Platelets 225; Potassium 3.4; Sodium 140   Recent Lipid Panel Lab Results  Component Value Date/Time   CHOL 144 01/27/2020 08:55 PM   TRIG 76 01/27/2020 08:55 PM   HDL 50 01/27/2020 08:55 PM   CHOLHDL 2.9 01/27/2020 08:55 PM   LDLCALC 79 01/27/2020 08:55 PM   LDLCALC 68 01/04/2020 09:45 AM    Wt Readings from Last 3 Encounters:  05/02/20 143 lb (64.9 kg)  02/25/20 143 lb 11.8 oz (65.2 kg)  01/27/20 150 lb (68 kg)     Objective:    Vital Signs:  BP 130/68   Pulse 68   Resp 20   Ht 5\' 1"  (1.549 m)   Wt 143 lb (64.9 kg)   BMI 27.02 kg/m    General: Pleasant female sounding in NAD Psych: Normal affect. Neuro: Alert and oriented X 2 (person, place).  Lungs:  Resp regular and unlabored while talking on the phone.    ASSESSMENT & PLAN:    1. Chronic Combined Systolic and Diastolic CHF/Presumed Ischemic Cardiomyopathy - She experienced an NSTEMI in 10/2019 with medical management pursued given her dementia and multiple medical issues. Echo showed a reduced EF of 35 to 40%. - She denies any recent orthopnea, PND or lower extremity edema. Weight has been stable when checked at [facility]. Will continue current medication regimen for now with Coreg 6.25 mg twice daily, Imdur 15 mg daily, Losartan 50 mg daily and Spironolactone 12.5 mg daily. She has been experiencing intermittent episodes of dizziness and I requested they check orthostatics and report back on this. If found to be orthostatic, may need to discontinue Spironolactone. We discussed possibly obtaining a repeat echo in the future but at this time it would not change her management strategy. Would again revisit at the time of her next follow-up.  Of note, she is also on ASA 325 mg daily and I recommended they review with her PCP to see if this could be lowered to 81 mg daily.  2.  Paroxysmal Atrial Fibrillation - She denies any recent palpitations and heart rate has been well controlled in the 60's to 70's. Continue Coreg 6.25 mg twice daily for rate control. - This patients CHA2DS2-VASc Score and unadjusted Ischemic Stroke  Rate (% per year) is equal to 9.7 % stroke rate/year from a score of 6 (CHF, HTN, Vascular, Female, Age (2)). She is not on anticoagulation given dementia and her frequent falls.   3. HTN - BP was well-controlled at 130/68 on most recent check. Continue current medication regimen with plans to check orthostatic vitals as outlined above.    4. HLD - Followed by her PCP. She remains on Simvastatin 40 mg daily.   5. AAA - Measured 4 cm by imaging in 10/2019. Would plan to review further with family members if they are able to attend subsequent follow-up visits but at this time, would not anticipate surveillance imaging as she would not be a surgical candidate given her age and dementia.   COVID-19 Education: The signs and symptoms of COVID-19 were discussed with the patient and how to seek care for testing (follow up with PCP or arrange E-visit). The importance of social distancing was discussed today.  Time:   Today, I have spent 13 minutes with the patient with telehealth technology discussing the above problems.     Medication Adjustments/Labs and Tests Ordered: Current medicines are reviewed at length with the patient today.  Concerns regarding medicines are outlined above.   Tests Ordered: No orders of the defined types were placed in this encounter.   Medication Changes: No orders of the defined types were placed in this encounter.   Follow Up:  Either in person or Virtual in 4-5 months .  Signed, Erma Heritage, PA-C  05/02/2020 5:01 PM    [facility]

## 2020-05-02 ENCOUNTER — Telehealth (INDEPENDENT_AMBULATORY_CARE_PROVIDER_SITE_OTHER): Payer: Medicare HMO | Admitting: Student

## 2020-05-02 ENCOUNTER — Encounter: Payer: Self-pay | Admitting: Student

## 2020-05-02 ENCOUNTER — Other Ambulatory Visit: Payer: Self-pay

## 2020-05-02 VITALS — BP 130/68 | HR 68 | Resp 20 | Ht 61.0 in | Wt 143.0 lb

## 2020-05-02 DIAGNOSIS — E785 Hyperlipidemia, unspecified: Secondary | ICD-10-CM

## 2020-05-02 DIAGNOSIS — I10 Essential (primary) hypertension: Secondary | ICD-10-CM | POA: Diagnosis not present

## 2020-05-02 DIAGNOSIS — I5042 Chronic combined systolic (congestive) and diastolic (congestive) heart failure: Secondary | ICD-10-CM

## 2020-05-02 DIAGNOSIS — I7121 Aneurysm of the ascending aorta, without rupture: Secondary | ICD-10-CM

## 2020-05-02 DIAGNOSIS — I712 Thoracic aortic aneurysm, without rupture: Secondary | ICD-10-CM

## 2020-05-02 DIAGNOSIS — I480 Paroxysmal atrial fibrillation: Secondary | ICD-10-CM

## 2020-05-02 NOTE — Patient Instructions (Signed)
Medication Instructions:  Your physician recommends that you continue on your current medications as directed. Please refer to the Current Medication list given to you today.  Please check orthostatics and fax to office at [phone]  *If you need a refill on your cardiac medications before your next appointment, please call your pharmacy*   Lab Work: NONE   If you have labs (blood work) drawn today and your tests are completely normal, you will receive your results only by: Marland Kitchen MyChart Message (if you have MyChart) OR . A paper copy in the mail If you have any lab test that is abnormal or we need to change your treatment, we will call you to review the results.   Testing/Procedures: NONE    Follow-Up: At [facility], you and your health needs are our priority.  As part of our continuing mission to provide you with exceptional heart care, we have created designated Provider Care Teams.  These Care Teams include your primary Cardiologist (physician) and Advanced Practice Providers (APPs -  Physician Assistants and Nurse Practitioners) who all work together to provide you with the care you need, when you need it.  We recommend signing up for the patient portal called "MyChart".  Sign up information is provided on this After Visit Summary.  MyChart is used to connect with patients for Virtual Visits (Telemedicine).  Patients are able to view lab/test results, encounter notes, upcoming appointments, etc.  Non-urgent messages can be sent to your provider as well.   To learn more about what you can do with MyChart, go to [email or web address].    Your next appointment:   4-5 month(s)  The format for your next appointment:   In Person  Provider:   Rozann Lesches, MD or Bernerd Pho, PA-C   Other Instructions Thank you for choosing [facility]!

## 2020-05-03 ENCOUNTER — Telehealth: Payer: Self-pay | Admitting: Student

## 2020-05-03 NOTE — Telephone Encounter (Signed)
    I appreciate them checking orthostatics. While her SBP did decline by 14 points, technically not enough to qualify for orthostatic hypotension. I doubt this is causing her dizziness, given such a low dose of Spironolactone. If she continues to have dizziness, we can do a trial of stopping this to see if symptoms improve but would not change her medication regimen at this time.   Signed, Erma Heritage, PA-C 05/03/2020, 11:10 AM Pager: [phone]

## 2020-05-03 NOTE — Telephone Encounter (Signed)
NEW MESSAGE     [name] was seen yesterday and they were told to track her orthostatic bp this morning   Laying down her bp was 134/70  Sitting 128/72 Standing  120/68

## 2020-05-03 NOTE — Telephone Encounter (Signed)
Sharyn Lull at [facility] notified

## 2020-05-29 ENCOUNTER — Inpatient Hospital Stay (HOSPITAL_COMMUNITY): Payer: Medicare HMO

## 2020-05-29 ENCOUNTER — Emergency Department (HOSPITAL_COMMUNITY): Payer: Medicare HMO

## 2020-05-29 ENCOUNTER — Encounter (HOSPITAL_COMMUNITY): Payer: Self-pay

## 2020-05-29 ENCOUNTER — Inpatient Hospital Stay (HOSPITAL_COMMUNITY)
Admission: EM | Admit: 2020-05-29 | Discharge: 2020-05-31 | DRG: 603 | Disposition: A | Discharge status: Home Health | Payer: Medicare HMO | Source: Skilled Nursing Facility | Attending: Internal Medicine | Admitting: Internal Medicine

## 2020-05-29 ENCOUNTER — Other Ambulatory Visit: Payer: Self-pay

## 2020-05-29 DIAGNOSIS — Z8249 Family history of ischemic heart disease and other diseases of the circulatory system: Secondary | ICD-10-CM

## 2020-05-29 DIAGNOSIS — Z800 Family history of malignant neoplasm of digestive organs: Secondary | ICD-10-CM | POA: Diagnosis not present

## 2020-05-29 DIAGNOSIS — L03115 Cellulitis of right lower limb: Secondary | ICD-10-CM | POA: Diagnosis present

## 2020-05-29 DIAGNOSIS — I110 Hypertensive heart disease with heart failure: Secondary | ICD-10-CM | POA: Diagnosis present

## 2020-05-29 DIAGNOSIS — I480 Paroxysmal atrial fibrillation: Secondary | ICD-10-CM | POA: Diagnosis present

## 2020-05-29 DIAGNOSIS — M810 Age-related osteoporosis without current pathological fracture: Secondary | ICD-10-CM | POA: Diagnosis present

## 2020-05-29 DIAGNOSIS — I4891 Unspecified atrial fibrillation: Secondary | ICD-10-CM | POA: Diagnosis present

## 2020-05-29 DIAGNOSIS — D509 Iron deficiency anemia, unspecified: Secondary | ICD-10-CM | POA: Diagnosis present

## 2020-05-29 DIAGNOSIS — Z882 Allergy status to sulfonamides status: Secondary | ICD-10-CM | POA: Diagnosis not present

## 2020-05-29 DIAGNOSIS — L0390 Cellulitis, unspecified: Secondary | ICD-10-CM | POA: Diagnosis present

## 2020-05-29 DIAGNOSIS — I429 Cardiomyopathy, unspecified: Secondary | ICD-10-CM | POA: Diagnosis present

## 2020-05-29 DIAGNOSIS — Z7982 Long term (current) use of aspirin: Secondary | ICD-10-CM | POA: Diagnosis not present

## 2020-05-29 DIAGNOSIS — Z7983 Long term (current) use of bisphosphonates: Secondary | ICD-10-CM | POA: Diagnosis not present

## 2020-05-29 DIAGNOSIS — I5042 Chronic combined systolic (congestive) and diastolic (congestive) heart failure: Secondary | ICD-10-CM | POA: Diagnosis present

## 2020-05-29 DIAGNOSIS — F32A Depression, unspecified: Secondary | ICD-10-CM | POA: Diagnosis present

## 2020-05-29 DIAGNOSIS — Z8719 Personal history of other diseases of the digestive system: Secondary | ICD-10-CM

## 2020-05-29 DIAGNOSIS — Z888 Allergy status to other drugs, medicaments and biological substances status: Secondary | ICD-10-CM

## 2020-05-29 DIAGNOSIS — I10 Essential (primary) hypertension: Secondary | ICD-10-CM | POA: Diagnosis not present

## 2020-05-29 DIAGNOSIS — Z20822 Contact with and (suspected) exposure to covid-19: Secondary | ICD-10-CM | POA: Diagnosis present

## 2020-05-29 DIAGNOSIS — E785 Hyperlipidemia, unspecified: Secondary | ICD-10-CM | POA: Diagnosis present

## 2020-05-29 DIAGNOSIS — Z8673 Personal history of transient ischemic attack (TIA), and cerebral infarction without residual deficits: Secondary | ICD-10-CM | POA: Diagnosis not present

## 2020-05-29 DIAGNOSIS — E7800 Pure hypercholesterolemia, unspecified: Secondary | ICD-10-CM | POA: Diagnosis present

## 2020-05-29 DIAGNOSIS — Z880 Allergy status to penicillin: Secondary | ICD-10-CM

## 2020-05-29 DIAGNOSIS — Z66 Do not resuscitate: Secondary | ICD-10-CM | POA: Diagnosis present

## 2020-05-29 DIAGNOSIS — Z79899 Other long term (current) drug therapy: Secondary | ICD-10-CM | POA: Diagnosis not present

## 2020-05-29 DIAGNOSIS — Z823 Family history of stroke: Secondary | ICD-10-CM | POA: Diagnosis not present

## 2020-05-29 DIAGNOSIS — F0390 Unspecified dementia without behavioral disturbance: Secondary | ICD-10-CM | POA: Diagnosis present

## 2020-05-29 DIAGNOSIS — Z789 Other specified health status: Secondary | ICD-10-CM

## 2020-05-29 HISTORY — DX: Depression, unspecified: F32.A

## 2020-05-29 LAB — BASIC METABOLIC PANEL
Anion gap: 8 (ref 5–15)
BUN: 11 mg/dL (ref 8–23)
CO2: 29 mmol/L (ref 22–32)
Calcium: 9 mg/dL (ref 8.9–10.3)
Chloride: 98 mmol/L (ref 98–111)
Creatinine, Ser: 0.75 mg/dL (ref 0.44–1.00)
GFR, Estimated: >60 mL/min (ref >60)
Glucose, Bld: 138 mg/dL — ABNORMAL HIGH (ref 70–99)
Potassium: 4 mmol/L (ref 3.5–5.1)
Sodium: 135 mmol/L (ref 135–145)

## 2020-05-29 LAB — RESPIRATORY PANEL BY RT PCR (FLU A&B, COVID)
Influenza A by PCR: NEGATIVE
Influenza B by PCR: NEGATIVE
SARS Coronavirus 2 by RT PCR: NEGATIVE

## 2020-05-29 LAB — CBC WITH DIFFERENTIAL/PLATELET
Abs Immature Granulocytes: 0.06 10*3/uL (ref 0.00–0.07)
Basophils Absolute: 0.1 10*3/uL (ref 0.0–0.1)
Basophils Relative: 0 %
Eosinophils Absolute: 0.2 10*3/uL (ref 0.0–0.5)
Eosinophils Relative: 2 %
HCT: 31.7 % — ABNORMAL LOW (ref 36.0–46.0)
Hemoglobin: 10.3 g/dL — ABNORMAL LOW (ref 12.0–15.0)
Immature Granulocytes: 1 %
Lymphocytes Relative: 15 %
Lymphs Abs: 1.9 10*3/uL (ref 0.7–4.0)
MCH: 22.9 pg — ABNORMAL LOW (ref 26.0–34.0)
MCHC: 32.5 g/dL (ref 30.0–36.0)
MCV: 70.6 fL — ABNORMAL LOW (ref 80.0–100.0)
Monocytes Absolute: 1.3 10*3/uL — ABNORMAL HIGH (ref 0.1–1.0)
Monocytes Relative: 10 %
Neutro Abs: 9.7 10*3/uL — ABNORMAL HIGH (ref 1.7–7.7)
Neutrophils Relative %: 72 %
Platelets: 331 10*3/uL (ref 150–400)
RBC: 4.49 MIL/uL (ref 3.87–5.11)
RDW: 19.7 % — ABNORMAL HIGH (ref 11.5–15.5)
WBC: 13.2 10*3/uL — ABNORMAL HIGH (ref 4.0–10.5)
nRBC: 0 % (ref 0.0–0.2)

## 2020-05-29 LAB — LACTIC ACID, PLASMA: Lactic Acid, Venous: 1.5 mmol/L (ref 0.5–1.9)

## 2020-05-29 MED ORDER — MEMANTINE HCL 10 MG PO TABS
10.0000 mg | ORAL_TABLET | Freq: Two times a day (BID) | ORAL | Status: DC
  Administered 2020-05-29 – 2020-05-31 (×4): 10 mg via ORAL
  Filled 2020-05-29 (×4): qty 1

## 2020-05-29 MED ORDER — TAMSULOSIN HCL 0.4 MG PO CAPS
0.4000 mg | ORAL_CAPSULE | Freq: Every day | ORAL | Status: DC
  Administered 2020-05-29 – 2020-05-30 (×2): 0.4 mg via ORAL
  Filled 2020-05-29 (×2): qty 1

## 2020-05-29 MED ORDER — SENNOSIDES-DOCUSATE SODIUM 8.6-50 MG PO TABS
2.0000 | ORAL_TABLET | Freq: Every day | ORAL | Status: DC
  Administered 2020-05-29 – 2020-05-30 (×2): 2 via ORAL
  Filled 2020-05-29 (×4): qty 2

## 2020-05-29 MED ORDER — SIMVASTATIN 20 MG PO TABS
40.0000 mg | ORAL_TABLET | Freq: Every evening | ORAL | Status: DC
  Administered 2020-05-29 – 2020-05-30 (×2): 40 mg via ORAL
  Filled 2020-05-29: qty 4
  Filled 2020-05-29: qty 2

## 2020-05-29 MED ORDER — SODIUM CHLORIDE 0.9 % IV SOLN: 1.0000 g | Freq: Once | INTRAVENOUS | Status: DC

## 2020-05-29 MED ORDER — LOSARTAN POTASSIUM 50 MG PO TABS
25.0000 mg | ORAL_TABLET | Freq: Every day | ORAL | Status: DC
  Administered 2020-05-29 – 2020-05-31 (×3): 25 mg via ORAL
  Filled 2020-05-29 (×3): qty 1

## 2020-05-29 MED ORDER — ISOSORBIDE MONONITRATE ER 30 MG PO TB24
15.0000 mg | ORAL_TABLET | Freq: Every evening | ORAL | Status: DC
  Administered 2020-05-29 – 2020-05-30 (×2): 15 mg via ORAL
  Filled 2020-05-29 (×4): qty 1

## 2020-05-29 MED ORDER — IOHEXOL 300 MG/ML  SOLN
75.0000 mL | Freq: Once | INTRAMUSCULAR | Status: AC | PRN
  Administered 2020-05-29: 125 mL via INTRAVENOUS

## 2020-05-29 MED ORDER — SODIUM CHLORIDE 0.9 % IV SOLN: INTRAVENOUS | Status: DC

## 2020-05-29 MED ORDER — VANCOMYCIN HCL IN DEXTROSE 1-5 GM/200ML-% IV SOLN
1000.0000 mg | Freq: Once | INTRAVENOUS | Status: AC
  Administered 2020-05-29: 1000 mg via INTRAVENOUS
  Filled 2020-05-29: qty 200

## 2020-05-29 MED ORDER — DONEPEZIL HCL 5 MG PO TABS
5.0000 mg | ORAL_TABLET | Freq: Every day | ORAL | Status: DC
  Administered 2020-05-29 – 2020-05-30 (×2): 5 mg via ORAL
  Filled 2020-05-29 (×4): qty 1

## 2020-05-29 MED ORDER — ACETAMINOPHEN 325 MG PO TABS
650.0000 mg | ORAL_TABLET | Freq: Four times a day (QID) | ORAL | Status: DC | PRN
  Administered 2020-05-29: 650 mg via ORAL
  Filled 2020-05-29: qty 2

## 2020-05-29 MED ORDER — SPIRONOLACTONE 25 MG PO TABS
12.5000 mg | ORAL_TABLET | Freq: Every day | ORAL | Status: DC
  Administered 2020-05-29 – 2020-05-31 (×3): 12.5 mg via ORAL
  Filled 2020-05-29 (×2): qty 1
  Filled 2020-05-29: qty 0.5
  Filled 2020-05-29: qty 1
  Filled 2020-05-29 (×4): qty 0.5

## 2020-05-29 MED ORDER — ENOXAPARIN SODIUM 40 MG/0.4ML ~~LOC~~ SOLN
40.0000 mg | SUBCUTANEOUS | Status: DC
  Administered 2020-05-29 – 2020-05-30 (×2): 40 mg via SUBCUTANEOUS
  Filled 2020-05-29 (×2): qty 0.4

## 2020-05-29 MED ORDER — CARVEDILOL 3.125 MG PO TABS
6.2500 mg | ORAL_TABLET | Freq: Two times a day (BID) | ORAL | Status: DC
  Administered 2020-05-29 – 2020-05-31 (×4): 6.25 mg via ORAL
  Filled 2020-05-29 (×4): qty 2

## 2020-05-29 MED ORDER — VANCOMYCIN HCL IN DEXTROSE 1-5 GM/200ML-% IV SOLN
1000.0000 mg | INTRAVENOUS | Status: DC
  Administered 2020-05-30: 1000 mg via INTRAVENOUS
  Filled 2020-05-29 (×2): qty 200

## 2020-05-29 MED ORDER — HYDRALAZINE HCL 20 MG/ML IJ SOLN: 10.0000 mg | INTRAMUSCULAR | Status: DC | PRN

## 2020-05-29 MED ORDER — ACETAMINOPHEN 650 MG RE SUPP: 650.0000 mg | Freq: Four times a day (QID) | RECTAL | Status: DC | PRN

## 2020-05-29 MED ORDER — VANCOMYCIN HCL IN DEXTROSE 1-5 GM/200ML-% IV SOLN: 1000.0000 mg | Freq: Once | INTRAVENOUS | Status: DC

## 2020-05-29 MED ORDER — ONDANSETRON HCL 4 MG PO TABS: 4.0000 mg | ORAL_TABLET | Freq: Four times a day (QID) | ORAL | Status: DC | PRN

## 2020-05-29 MED ORDER — SODIUM CHLORIDE 0.9 % IV SOLN
1.0000 g | Freq: Two times a day (BID) | INTRAVENOUS | Status: DC
  Administered 2020-05-29 – 2020-05-31 (×4): 1 g via INTRAVENOUS
  Filled 2020-05-29 (×4): qty 1

## 2020-05-29 MED ORDER — NORTRIPTYLINE HCL 25 MG PO CAPS
25.0000 mg | ORAL_CAPSULE | Freq: Every day | ORAL | Status: DC
  Administered 2020-05-29 – 2020-05-30 (×2): 25 mg via ORAL
  Filled 2020-05-29 (×4): qty 1

## 2020-05-29 MED ORDER — CLINDAMYCIN PHOSPHATE 600 MG/50ML IV SOLN
600.0000 mg | Freq: Three times a day (TID) | INTRAVENOUS | Status: DC
  Administered 2020-05-29 – 2020-05-30 (×2): 600 mg via INTRAVENOUS
  Filled 2020-05-29 (×3): qty 50

## 2020-05-29 MED ORDER — ONDANSETRON HCL 4 MG/2ML IJ SOLN: 4.0000 mg | Freq: Four times a day (QID) | INTRAMUSCULAR | Status: DC | PRN

## 2020-05-29 MED ORDER — ASPIRIN EC 325 MG PO TBEC
325.0000 mg | DELAYED_RELEASE_TABLET | Freq: Every day | ORAL | Status: DC
  Administered 2020-05-29 – 2020-05-31 (×3): 325 mg via ORAL
  Filled 2020-05-29 (×3): qty 1

## 2020-05-29 MED ORDER — CYCLOSPORINE 0.05 % OP EMUL
1.0000 [drp] | Freq: Two times a day (BID) | OPHTHALMIC | Status: DC
  Administered 2020-05-29 – 2020-05-30 (×3): 1 [drp] via OPHTHALMIC
  Filled 2020-05-29 (×6): qty 1

## 2020-05-29 MED ORDER — TRAMADOL HCL 50 MG PO TABS
50.0000 mg | ORAL_TABLET | Freq: Four times a day (QID) | ORAL | Status: DC | PRN
  Administered 2020-05-30 (×2): 50 mg via ORAL
  Filled 2020-05-29 (×2): qty 1

## 2020-05-29 NOTE — ED Triage Notes (Signed)
Pt from [facility].  EMS reports pt has cellulitis to r lower leg.  Facility reports pt gets home health for wound care.  Recently finished clindamycin.  Pt alert and oriented at this time.  C/O "nagging" pain in r leg.

## 2020-05-29 NOTE — ED Provider Notes (Signed)
[facility] EMERGENCY DEPARTMENT Provider Note   CSN: [id number] Arrival date & time: 05/29/20  3614     History Chief Complaint  Patient presents with  . Cellulitis    [name] is a 86 y.o. female.  Pt presents to the ED today with cellulitis to her right lower leg.  Pt has dementia and is unable to give an accurate hx.  She is unsure how long she's had the cellulitis.  The facility reported that the pt has finished a course of clindamycin.  She has been getting wound care.  She normally walks without difficulty, but the pain has caused her not to be able to walk today.  She denies pain now, just when she puts weight on her leg.        Past Medical History:  Diagnosis Date  . Anemia   . Atrial fibrillation ([place])   . Cardiomyopathy ([place])    a. 10/2019: EF 35-40% by echo --> medical management recommended.   . Dementia ([place])   . Dementia ([place])   . Depression   . Diverticulitis 8/09, 3/10   Abscess in 2009 (medical tx)  . Essential hypertension   . Hyperlipidemia   . Hyperlipidemia   . Hyperthyroidism    Radiation, surgery  . IDA (iron deficiency anemia)   . Osteoporosis   . Postoperative anemia due to acute blood loss 09/08/2016  . Thalassemia   . Tubular adenoma of colon 12/15/08   Colonosocpy Dr Tonita Cong removed cecum  . Type 2 diabetes mellitus [facility])     Patient Active Problem List   Diagnosis Date Noted  . Cellulitis 05/29/2020  . Closed fracture of one rib of left side   . History of CVA (cerebrovascular accident) 02/24/2020  . Fall at home, initial encounter 02/24/2020  . Splenic laceration 02/24/2020  . History of atrial fibrillation 02/24/2020  . Hypertensive urgency 02/23/2020  . Microcytic anemia 01/28/2020  . Acute lower UTI 01/28/2020  . Acute CVA (cerebrovascular accident) ([place]) 01/27/2020  . Cardiomyopathy ([place]) 11/17/2019  . Ascending aortic aneurysm ([place]) 11/17/2019  . NSTEMI (non-ST elevated myocardial infarction) ([place]) 11/16/2019    . Chest pain 11/15/2019  . Postoperative anemia due to acute blood loss 09/08/2016  . Closed intertrochanteric fracture of left hip, initial encounter ([place]) 09/05/2016  . Dementia ([place]) 09/05/2016  . Abdominal pain 01/10/2011  . DERANGEMENT OF POSTERIOR HORN OF MEDIAL MENISCUS 09/25/2010  . CLOSED DISLOCATION OF DISTAL RADIOULNAR 03/28/2010  . COLONIC POLYPS, ADENOMATOUS, HX OF 07/31/2009  . PAROXYSMAL ATRIAL FIBRILLATION 10/26/2008  . Deficiency anemia 10/25/2008  . OTHER THALASSEMIA 10/25/2008  . DIVERTICULITIS, COLON 10/25/2008  . Diabetes mellitus due to underlying condition, controlled ([place]) 10/24/2008  . HYPERCHOLESTEROLEMIA 10/24/2008  . Iron deficiency anemia 10/24/2008  . Essential hypertension 10/24/2008  . Osteoporosis 10/24/2008  . HYPERTHYROIDISM, HX OF 10/24/2008    Past Surgical History:  Procedure Laterality Date  . CESAREAN SECTION     x2  . CHOLECYSTECTOMY    . FOOT SURGERY     bilat  . INTRAMEDULLARY (IM) NAIL INTERTROCHANTERIC Left 09/06/2016   Procedure: LEFT HIP INTRAMEDULLARY (IM) NAIL;  Surgeon: Renette Butters, MD;  Location: [facility];  Service: Orthopedics;  Laterality: Left;  . THYROIDECTOMY, PARTIAL       OB History   No obstetric history on file.     Family History  Problem Relation Age of Onset  . Pneumonia Father   . Coronary artery disease Mother   . Stroke Mother   .  Colon cancer Mother 62  . Thalassemia Sister     Social History   Tobacco Use  . Smoking status: Never Smoker  . Smokeless tobacco: Never Used  Substance Use Topics  . Alcohol use: Never  . Drug use: No    Home Medications Prior to Admission medications   Medication Sig Start Date End Date Taking? Authorizing Provider  acetaminophen (TYLENOL) 325 MG tablet Take 2 tablets (650 mg total) by mouth every 6 (six) hours as needed for mild pain, fever or headache. 11/18/19   Roxan Hockey, MD  acetaminophen (TYLENOL) 325 MG tablet Take 2 tablets (650 mg total) by mouth  every 6 (six) hours as needed for mild pain (or Fever >/= 101). 02/25/20   Orson Eva, MD  alendronate (FOSAMAX) 70 MG tablet Take 70 mg by mouth every Wednesday. Take with a full glass of water on an empty stomach.    [provider]  aspirin EC 325 MG EC tablet Take 1 tablet (325 mg total) by mouth daily. 01/31/20   Elgergawy, Silver Huguenin, MD  calcium citrate-vitamin D 500-400 MG-UNIT chewable tablet Chew by mouth 2 (two) times daily.    [provider]  carvedilol (COREG) 6.25 MG tablet Take 1 tablet (6.25 mg total) by mouth 2 (two) times daily with a meal. 11/18/19   Emokpae, Courage, MD  Cranberry 450 MG TABS Take 450 mg by mouth 2 (two) times daily.    [provider]  cycloSPORINE (RESTASIS) 0.05 % ophthalmic emulsion Place 1 drop into both eyes 2 (two) times daily.    [provider]  donepezil (ARICEPT) 5 MG tablet Take 1 tablet (5 mg total) by mouth at bedtime. 11/18/19   Roxan Hockey, MD  ferrous sulfate 325 (65 FE) MG tablet Take 325 mg by mouth in the morning and at bedtime.    [provider]  fish oil-omega-3 fatty acids 1000 MG capsule Take 2 g by mouth daily.      [provider]  isosorbide mononitrate (IMDUR) 30 MG 24 hr tablet Take 0.5 tablets (15 mg total) by mouth every evening. 11/18/19   Roxan Hockey, MD  losartan (COZAAR) 50 MG tablet Take 1 tablet (50 mg total) by mouth daily. 11/19/19   Roxan Hockey, MD  meclizine (ANTIVERT) 12.5 MG tablet Take 12.5 mg by mouth every 6 (six) hours as needed for dizziness.    [provider]  memantine (NAMENDA) 10 MG tablet Take 1 tablet (10 mg total) by mouth 2 (two) times daily. 11/18/19   Roxan Hockey, MD  nortriptyline (PAMELOR) 25 MG capsule Take 25 mg by mouth at bedtime.    [provider]  senna-docusate (SENOKOT-S) 8.6-50 MG tablet Take 2 tablets by mouth at bedtime. 11/18/19 11/17/20  Roxan Hockey, MD  simvastatin (ZOCOR) 40 MG tablet Take 1 tablet (40 mg  total) by mouth every evening. 11/18/19   Roxan Hockey, MD  spironolactone (ALDACTONE) 25 MG tablet Take 0.5 tablets (12.5 mg total) by mouth daily. 11/19/19   Roxan Hockey, MD  tamsulosin (FLOMAX) 0.4 MG CAPS capsule Take 1 capsule (0.4 mg total) by mouth daily after supper. 11/18/19   Roxan Hockey, MD    Allergies    Bee venom, Carbapenems, Cephalosporins, Procaine hcl, Sulfonamide derivatives, and Penicillins  Review of Systems   Review of Systems  Musculoskeletal:       Right leg pain  Skin: Positive for rash.  All other systems reviewed and are negative.   Physical Exam Updated Vital  Signs BP (!) 169/77   Pulse 82   Temp 98.6 F (37 C) (Oral)   Resp 16   Wt 68 kg   SpO2 95%   BMI 28.34 kg/m   Physical Exam Vitals and nursing note reviewed.  Constitutional:      Appearance: Normal appearance.  HENT:     Head: Normocephalic and atraumatic.     Right Ear: External ear normal.     Left Ear: External ear normal.     Nose: Nose normal.     Mouth/Throat:     Mouth: Mucous membranes are moist.     Pharynx: Oropharynx is clear.  Eyes:     Extraocular Movements: Extraocular movements intact.     Conjunctiva/sclera: Conjunctivae normal.     Pupils: Pupils are equal, round, and reactive to light.  Cardiovascular:     Rate and Rhythm: Normal rate and regular rhythm.     Pulses: Normal pulses.     Heart sounds: Normal heart sounds.  Pulmonary:     Effort: Pulmonary effort is normal.     Breath sounds: Normal breath sounds.  Abdominal:     General: Abdomen is flat. Bowel sounds are normal.     Palpations: Abdomen is soft.  Musculoskeletal:     Cervical back: Normal range of motion and neck supple.     Comments: See pictures  Skin:    Capillary Refill: Capillary refill takes less than 2 seconds.     Comments: See pictures  Neurological:     Mental Status: She is alert. Mental status is at baseline.  Psychiatric:        Mood and Affect: Mood normal.         Behavior: Behavior normal.       ED Results / Procedures / Treatments   Labs (all labs ordered are listed, but only abnormal results are displayed) Labs Reviewed  BASIC METABOLIC PANEL - Abnormal; Notable for the following components:      Result Value   Glucose, Bld 138 (*)    All other components within normal limits  CBC WITH DIFFERENTIAL/PLATELET - Abnormal; Notable for the following components:   WBC 13.2 (*)    Hemoglobin 10.3 (*)    HCT 31.7 (*)    MCV 70.6 (*)    MCH 22.9 (*)    RDW 19.7 (*)    Neutro Abs 9.7 (*)    Monocytes Absolute 1.3 (*)    All other components within normal limits  CULTURE, BLOOD (ROUTINE X 2)  RESPIRATORY PANEL BY RT PCR (FLU A&B, COVID)  LACTIC ACID, PLASMA      EKG None  Radiology US Venous Img Lower Unilateral Right  Result Date: 05/29/2020 CLINICAL DATA:  86 year old with right lower extremity pain. Cellulitis and open wound. EXAM: RIGHT LOWER EXTREMITY VENOUS DOPPLER ULTRASOUND TECHNIQUE: Gray-scale sonography with graded compression, as well as color Doppler and duplex ultrasound were performed to evaluate the lower extremity deep venous systems from the level of the common femoral vein and including the common femoral, femoral, profunda femoral, popliteal and calf veins including the posterior tibial, peroneal and gastrocnemius veins when visible. The superficial great saphenous vein was also interrogated. Spectral Doppler was utilized to evaluate flow at rest and with distal augmentation maneuvers in the common femoral, femoral and popliteal veins. COMPARISON:  05/12/2013 FINDINGS: Contralateral Common Femoral Vein: Respiratory phasicity is normal and symmetric with the symptomatic side. No evidence of thrombus. Normal compressibility. Common Femoral Vein: No evidence of thrombus.  Normal compressibility, respiratory phasicity and response to augmentation. Saphenofemoral Junction: No evidence of thrombus. Normal compressibility and flow on  color Doppler imaging. Profunda Femoral Vein: No evidence of thrombus. Normal compressibility and flow on color Doppler imaging. Femoral Vein: No evidence of thrombus. Normal compressibility, respiratory phasicity and response to augmentation. Popliteal Vein: No evidence of thrombus. Normal compressibility, respiratory phasicity and response to augmentation. Calf Veins: Visualized right deep calf veins are patent without thrombus. Superficial Great Saphenous Vein: No evidence of thrombus. Normal compressibility. Other Findings:  None. IMPRESSION: Negative for deep venous thrombosis in right lower extremity. Electronically Signed   By: Markus Daft M.D.   On: 05/29/2020 11:07    Procedures Procedures (including critical care time)  Medications Ordered in ED Medications  0.9 %  sodium chloride infusion ( Intravenous New Bag/Given 05/29/20 1004)  vancomycin (VANCOCIN) IVPB 1000 mg/200 mL premix (0 mg Intravenous Stopped 05/29/20 1117)    ED Course  I have reviewed the triage vital signs and the nursing notes.  Pertinent labs & imaging results that were available during my care of the patient were reviewed by me and considered in my medical decision making (see chart for details).    MDM Rules/Calculators/A&P                          Pt has been on a week of clinda without improvement in her cellulitis.  It is getting worse.  Pt d/w Dr. Roderic Palau (triad) for admission.  Final Clinical Impression(s) / ED Diagnoses Final diagnoses:  Cellulitis of right lower extremity  Failure of outpatient treatment    Rx / DC Orders ED Discharge Orders    None       Isla Pence, MD 05/29/20 1141

## 2020-05-29 NOTE — Progress Notes (Addendum)
Pharmacy Antibiotic Note  [name] is a 86 y.o. female admitted on 05/29/2020 with cellulitis.  Pharmacy has been consulted for vancomycin and meropenem  dosing. Patient failed outpatient oral clindamycin.  Several antibiotic classes have been listed as allergies, with no reaction listed, so will monitor patient closely for any signs of reaction.  Plan: Start meropenem 1g IV q12h Loading dose: vancomycin 1g +vancomycin 1g for a total of vancomycin 2g Start vancomycin 1g IV q24h Goal vancomycin trough range: 10-51mcg/mL Pharmacy to monitor labs, cultures and troughs (as needed)  Weight: 68 kg (150 lb)  Temp (24hrs), Avg:98.6 F (37 C), Min:98.6 F (37 C), Max:98.6 F (37 C)  Recent Labs  Lab 05/29/20 0952  WBC 13.2*  CREATININE 0.75  LATICACIDVEN 1.5    Estimated Creatinine Clearance: 42.1 mL/min (by C-G formula based on SCr of 0.75 mg/dL).    Allergies  Allergen Reactions  . Bee Venom     Listed per Nursing Center-No reaction is noted  . Carbapenems     Listed per Nursing Center-No reaction is noted  . Cephalosporins     Listed per Nursing Center-No reaction is noted  . Procaine Hcl Other (See Comments)    Numbness/jerkiness  . Sulfonamide Derivatives Other (See Comments)    hypertension  . Penicillins Rash    Antimicrobials this admission: vancomycin 11/15 >>   clindamycin 11/15>>   meropenem 11/15>>  Dose adjustments this admission: meropenem  Microbiology results: 11/15  BC x2:  NG <12h  11/15 Resp PCR: SARS CoV-2 negative; Flu A/B negative   Thank you for allowing pharmacy to be a part of this patient's care.  Despina Pole 05/29/2020 5:23 PM

## 2020-05-29 NOTE — H&P (Signed)
History and Physical    [name] SWF:[id number] DOB: [birth year] DOA: 05/29/2020  PCP: Tilda Burrow, NP  Patient coming from: [facility]  I have personally briefly reviewed patient's old medical records in [place]  Chief Complaint: Leg infection  HPI: [name] is a 86 y.o. female with medical history significant of atrial fibrillation, chronic combined CHF, dementia, hypertension, hyperlipidemia, who is a resident of an assisted living facility.  Patient and family report that she has had a chronic right lower extremity wound on the anterior aspect of her lower leg for quite some time now.  Over the past 2 weeks, her leg has become increasingly swollen, has become tender, and erythema around wound.  She started a course of clindamycin on 11/5-11/11, without significant improvement of her infection.  She has occasionally felt dizzy and lightheaded.  No nausea, vomiting, diarrhea, no shortness of breath, no fevers.  ED Course: She was evaluated emergency room where she was found to have a significant lower extremity wound with cellulitis.  Labs were relatively unrevealing other than a mildly elevated WBC count.  She is noted to be hypertensive.  Since she has failed oral antibiotics, she has been referred for admission  Review of Systems:  Review of Systems  Constitutional: Negative for chills and fever.  HENT: Negative for congestion and sore throat.   Eyes: Negative for blurred vision and double vision.  Respiratory: Negative for cough, sputum production and shortness of breath.   Cardiovascular: Positive for leg swelling. Negative for chest pain.  Gastrointestinal: Negative for abdominal pain, diarrhea and vomiting.  Genitourinary: Negative for dysuria.  Musculoskeletal: Negative for back pain and myalgias.  Neurological: Positive for dizziness. Negative for loss of consciousness.       Past Medical History:  Diagnosis Date  . Anemia   . Atrial  fibrillation ([place])   . Cardiomyopathy ([place])    a. 10/2019: EF 35-40% by echo --> medical management recommended.   . Dementia ([place])   . Dementia ([place])   . Depression   . Diverticulitis 8/09, 3/10   Abscess in 2009 (medical tx)  . Essential hypertension   . Hyperlipidemia   . Hyperlipidemia   . Hyperthyroidism    Radiation, surgery  . IDA (iron deficiency anemia)   . Osteoporosis   . Postoperative anemia due to acute blood loss 09/08/2016  . Thalassemia   . Tubular adenoma of colon 12/15/08   Colonosocpy Dr Tonita Cong removed cecum  . Type 2 diabetes mellitus ([place])     Past Surgical History:  Procedure Laterality Date  . CESAREAN SECTION     x2  . CHOLECYSTECTOMY    . FOOT SURGERY     bilat  . INTRAMEDULLARY (IM) NAIL INTERTROCHANTERIC Left 09/06/2016   Procedure: LEFT HIP INTRAMEDULLARY (IM) NAIL;  Surgeon: Renette Butters, MD;  Location: [facility];  Service: Orthopedics;  Laterality: Left;  . THYROIDECTOMY, PARTIAL      Social History:  reports that she has never smoked. She has never used smokeless tobacco. She reports that she does not drink alcohol and does not use drugs.  Allergies  Allergen Reactions  . Bee Venom     Listed per Nursing Center-No reaction is noted  . Carbapenems     Listed per Nursing Center-No reaction is noted  . Cephalosporins     Listed per Nursing Center-No reaction is noted  . Procaine Hcl Other (See Comments)    Numbness/jerkiness  . Sulfonamide Derivatives Other (See  Comments)    hypertension  . Penicillins Rash    Family History  Problem Relation Age of Onset  . Pneumonia Father   . Coronary artery disease Mother   . Stroke Mother   . Colon cancer Mother 2  . Thalassemia Sister     Prior to Admission medications   Medication Sig Start Date End Date Taking? Authorizing Provider  acetaminophen (TYLENOL) 325 MG tablet Take 2 tablets (650 mg total) by mouth every 6 (six) hours as needed for mild pain (or Fever >/= 101). 02/25/20  Yes  Tat, Shanon Brow, MD  alendronate (FOSAMAX) 70 MG tablet Take 70 mg by mouth every Wednesday. Take with a full glass of water on an empty stomach.   Yes [provider]  aspirin EC 325 MG EC tablet Take 1 tablet (325 mg total) by mouth daily. 01/31/20  Yes Elgergawy, Silver Huguenin, MD  calcium citrate-vitamin D 500-400 MG-UNIT chewable tablet Chew by mouth 2 (two) times daily.   Yes [provider]  carvedilol (COREG) 6.25 MG tablet Take 1 tablet (6.25 mg total) by mouth 2 (two) times daily with a meal. 11/18/19  Yes Emokpae, Courage, MD  Cranberry 450 MG TABS Take 450 mg by mouth 2 (two) times daily.   Yes [provider]  cycloSPORINE (RESTASIS) 0.05 % ophthalmic emulsion Place 1 drop into both eyes 2 (two) times daily.   Yes [provider]  donepezil (ARICEPT) 5 MG tablet Take 1 tablet (5 mg total) by mouth at bedtime. 11/18/19  Yes Emokpae, Courage, MD  ferrous sulfate 325 (65 FE) MG tablet Take 325 mg by mouth in the morning and at bedtime.   Yes [provider]  fish oil-omega-3 fatty acids 1000 MG capsule Take 2 g by mouth daily.     Yes [provider]  isosorbide mononitrate (IMDUR) 30 MG 24 hr tablet Take 0.5 tablets (15 mg total) by mouth every evening. 11/18/19  Yes Emokpae, Courage, MD  losartan (COZAAR) 50 MG tablet Take 1 tablet (50 mg total) by mouth daily. Patient taking differently: Take 25 mg by mouth daily.  11/19/19  Yes Roxan Hockey, MD  meclizine (ANTIVERT) 12.5 MG tablet Take 12.5 mg by mouth every 6 (six) hours as needed for dizziness.   Yes [provider]  memantine (NAMENDA) 10 MG tablet Take 1 tablet (10 mg total) by mouth 2 (two) times daily. 11/18/19  Yes Emokpae, Courage, MD  nortriptyline (PAMELOR) 25 MG capsule Take 25 mg by mouth at bedtime.   Yes [provider]  senna-docusate (SENOKOT-S) 8.6-50 MG tablet Take 2 tablets by mouth at bedtime. 11/18/19 11/17/20 Yes Emokpae, Courage, MD  simvastatin (ZOCOR) 40 MG  tablet Take 1 tablet (40 mg total) by mouth every evening. 11/18/19  Yes Emokpae, Courage, MD  spironolactone (ALDACTONE) 25 MG tablet Take 0.5 tablets (12.5 mg total) by mouth daily. 11/19/19  Yes Emokpae, Courage, MD  tamsulosin (FLOMAX) 0.4 MG CAPS capsule Take 1 capsule (0.4 mg total) by mouth daily after supper. 11/18/19  Yes Emokpae, Courage, MD  traMADol (ULTRAM) 50 MG tablet Take 50 mg by mouth every 6 (six) hours as needed for moderate pain.   Yes [provider]  acetaminophen (TYLENOL) 325 MG tablet Take 2 tablets (650 mg total) by mouth every 6 (six) hours as needed for mild pain, fever or headache. 11/18/19   Roxan Hockey, MD    Physical Exam: Vitals:   05/29/20 1530 05/29/20 1545 05/29/20 1600 05/29/20 1654  BP: Marland Kitchen)  187/71  (!) 196/76 (!) 162/93  Pulse: 66 64 66   Resp: 15 13 15 16   Temp:      TempSrc:      SpO2: 94% 94% 95%   Weight:        Constitutional: NAD, calm, comfortable Eyes: PERRL, lids and conjunctivae normal ENMT: Mucous membranes are moist. Posterior pharynx clear of any exudate or lesions.Normal dentition.  Neck: normal, supple, no masses, no thyromegaly Respiratory: clear to auscultation bilaterally, no wheezing, no crackles. Normal respiratory effort. No accessory muscle use.  Cardiovascular: Regular rate and rhythm, no murmurs / rubs / gallops. No extremity edema. 2+ pedal pulses. No carotid bruits.  Abdomen: no tenderness, no masses palpated. No hepatosplenomegaly. Bowel sounds positive.  Musculoskeletal: no clubbing / cyanosis. No joint deformity upper and lower extremities. Good ROM, no contractures. Normal muscle tone.  Skin: As noted in picture below Neurologic: CN 2-12 grossly intact. Sensation intact, DTR normal. Strength 5/5 in all 4.  Psychiatric: Normal judgment and insight. Alert and oriented x 3. Normal mood.        Labs on Admission: I have personally reviewed following labs and imaging studies  CBC: Recent Labs  Lab  05/29/20 0952  WBC 13.2*  NEUTROABS 9.7*  HGB 10.3*  HCT 31.7*  MCV 70.6*  PLT 254   Basic Metabolic Panel: Recent Labs  Lab 05/29/20 0952  NA 135  K 4.0  CL 98  CO2 29  GLUCOSE 138*  BUN 11  CREATININE 0.75  CALCIUM 9.0   GFR: Estimated Creatinine Clearance: 42.1 mL/min (by C-G formula based on SCr of 0.75 mg/dL). Liver Function Tests: No results for input(s): AST, ALT, ALKPHOS, BILITOT, PROT, ALBUMIN in the last 168 hours. No results for input(s): LIPASE, AMYLASE in the last 168 hours. No results for input(s): AMMONIA in the last 168 hours. Coagulation Profile: No results for input(s): INR, PROTIME in the last 168 hours. Cardiac Enzymes: No results for input(s): CKTOTAL, CKMB, CKMBINDEX, TROPONINI in the last 168 hours. BNP (last 3 results) No results for input(s): PROBNP in the last 8760 hours. HbA1C: No results for input(s): HGBA1C in the last 72 hours. CBG: No results for input(s): GLUCAP in the last 168 hours. Lipid Profile: No results for input(s): CHOL, HDL, LDLCALC, TRIG, CHOLHDL, LDLDIRECT in the last 72 hours. Thyroid Function Tests: No results for input(s): TSH, T4TOTAL, FREET4, T3FREE, THYROIDAB in the last 72 hours. Anemia Panel: No results for input(s): VITAMINB12, FOLATE, FERRITIN, TIBC, IRON, RETICCTPCT in the last 72 hours. Urine analysis:    Component Value Date/Time   COLORURINE YELLOW 01/27/2020 2150   APPEARANCEUR HAZY (A) 01/27/2020 2150   LABSPEC 1.003 (L) 01/27/2020 2150   PHURINE 8.0 01/27/2020 2150   GLUCOSEU NEGATIVE 01/27/2020 2150   HGBUR SMALL (A) 01/27/2020 2150   BILIRUBINUR NEGATIVE 01/27/2020 2150   KETONESUR NEGATIVE 01/27/2020 2150   PROTEINUR NEGATIVE 01/27/2020 2150   UROBILINOGEN 0.2 01/18/2011 2222   NITRITE POSITIVE (A) 01/27/2020 2150   LEUKOCYTESUR LARGE (A) 01/27/2020 2150    Radiological Exams on Admission: US Venous Img Lower Unilateral Right  Result Date: 05/29/2020 CLINICAL DATA:  86 year old with right  lower extremity pain. Cellulitis and open wound. EXAM: RIGHT LOWER EXTREMITY VENOUS DOPPLER ULTRASOUND TECHNIQUE: Gray-scale sonography with graded compression, as well as color Doppler and duplex ultrasound were performed to evaluate the lower extremity deep venous systems from the level of the common femoral vein and including the common femoral, femoral, profunda femoral, popliteal and calf veins including  the posterior tibial, peroneal and gastrocnemius veins when visible. The superficial great saphenous vein was also interrogated. Spectral Doppler was utilized to evaluate flow at rest and with distal augmentation maneuvers in the common femoral, femoral and popliteal veins. COMPARISON:  05/12/2013 FINDINGS: Contralateral Common Femoral Vein: Respiratory phasicity is normal and symmetric with the symptomatic side. No evidence of thrombus. Normal compressibility. Common Femoral Vein: No evidence of thrombus. Normal compressibility, respiratory phasicity and response to augmentation. Saphenofemoral Junction: No evidence of thrombus. Normal compressibility and flow on color Doppler imaging. Profunda Femoral Vein: No evidence of thrombus. Normal compressibility and flow on color Doppler imaging. Femoral Vein: No evidence of thrombus. Normal compressibility, respiratory phasicity and response to augmentation. Popliteal Vein: No evidence of thrombus. Normal compressibility, respiratory phasicity and response to augmentation. Calf Veins: Visualized right deep calf veins are patent without thrombus. Superficial Great Saphenous Vein: No evidence of thrombus. Normal compressibility. Other Findings:  None. IMPRESSION: Negative for deep venous thrombosis in right lower extremity. Electronically Signed   By: Markus Daft M.D.   On: 05/29/2020 11:07    Assessment/Plan Active Problems:   HYPERCHOLESTEROLEMIA   Essential hypertension   PAROXYSMAL ATRIAL FIBRILLATION   Dementia (H[facility])   Cardiomyopathy ([facility])   Cellulitis    Chronic combined systolic and diastolic CHF (congestive heart failure) (HCC)     Cellulitis -Patient had completed a course of oral antibiotics prior to admission with clindamycin -Venous Dopplers negative for DVT -She is been started on IV vancomycin in the emergency room -We will check CT lower extremity to rule out any deep tissue abscess -Continue IV antibiotics per cellulitis protocol -WOC consult  Hypertension -Resume home regimen of carvedilol -Use hydralazine as needed  Chronic combined CHF -Clinically appears compensated -Continue outpatient cardiac regimen  Paroxysmal atrial fibrillation -Heart rate is currently stable, continue Coreg -Not on anticoagulation due to high fall risk -Continue aspirin  Hyperlipidemia -Continue statin  Dementia -Continue Aricept and Namenda  DVT prophylaxis: Lovenox Code Status: DNR Family Communication: Updated patient's son Arnie who is power of attorney Disposition Plan: Return to [facility] on discharge Consults called:   Admission status: Inpatient, MedSurg  Kathie Dike MD [facility]   If 7PM-7AM, please contact night-coverage [email or web address]   05/29/2020, 4:58 PM

## 2020-05-30 DIAGNOSIS — F0390 Unspecified dementia without behavioral disturbance: Secondary | ICD-10-CM | POA: Diagnosis not present

## 2020-05-30 DIAGNOSIS — I480 Paroxysmal atrial fibrillation: Secondary | ICD-10-CM | POA: Diagnosis not present

## 2020-05-30 DIAGNOSIS — L03115 Cellulitis of right lower limb: Secondary | ICD-10-CM | POA: Diagnosis not present

## 2020-05-30 DIAGNOSIS — I5042 Chronic combined systolic (congestive) and diastolic (congestive) heart failure: Secondary | ICD-10-CM | POA: Diagnosis not present

## 2020-05-30 LAB — CBC
HCT: 30.1 % — ABNORMAL LOW (ref 36.0–46.0)
Hemoglobin: 9.2 g/dL — ABNORMAL LOW (ref 12.0–15.0)
MCH: 20.1 pg — ABNORMAL LOW (ref 26.0–34.0)
MCHC: 30.6 g/dL (ref 30.0–36.0)
MCV: 65.7 fL — ABNORMAL LOW (ref 80.0–100.0)
Platelets: 316 10*3/uL (ref 150–400)
RBC: 4.58 MIL/uL (ref 3.87–5.11)
RDW: 15.3 % (ref 11.5–15.5)
WBC: 10.7 10*3/uL — ABNORMAL HIGH (ref 4.0–10.5)
nRBC: 0 % (ref 0.0–0.2)

## 2020-05-30 LAB — BASIC METABOLIC PANEL
Anion gap: 9 (ref 5–15)
BUN: 13 mg/dL (ref 8–23)
CO2: 26 mmol/L (ref 22–32)
Calcium: 8.5 mg/dL — ABNORMAL LOW (ref 8.9–10.3)
Chloride: 104 mmol/L (ref 98–111)
Creatinine, Ser: 0.68 mg/dL (ref 0.44–1.00)
GFR, Estimated: >60 mL/min (ref >60)
Glucose, Bld: 102 mg/dL — ABNORMAL HIGH (ref 70–99)
Potassium: 3.7 mmol/L (ref 3.5–5.1)
Sodium: 139 mmol/L (ref 135–145)

## 2020-05-30 NOTE — Progress Notes (Signed)
PROGRESS NOTE    [name]  NFA:[id number] DOB: 20-Jan-1929 DOA: 05/29/2020 PCP: Tilda Burrow, NP    Brief Narrative:  [name] is a 86 y.o. female with medical history significant of atrial fibrillation, chronic combined CHF, dementia, hypertension, hyperlipidemia, who is a resident of an assisted living facility.  Patient and family report that she has had a chronic right lower extremity wound on the anterior aspect of her lower leg for quite some time now.  Over the past 2 weeks, her leg has become increasingly swollen, has become tender, and erythema around wound.  She started a course of clindamycin on 11/5-11/11, without significant improvement of her infection.  She has occasionally felt dizzy and lightheaded.  No nausea, vomiting, diarrhea, no shortness of breath, no fevers.   Assessment & Plan:   Active Problems:   HYPERCHOLESTEROLEMIA   Essential hypertension   PAROXYSMAL ATRIAL FIBRILLATION   Dementia (HCC)   Cardiomyopathy (HCC)   Cellulitis   Chronic combined systolic and diastolic CHF (congestive heart failure) (HCC)   Cellulitis -Patient had completed a course of oral antibiotics prior to admission with clindamycin -Venous Dopplers negative for DVT -Continue on IV vancomycin and meropenem -CT of the lower extremity negative for deep tissue abscess -Appreciate WOC consult -Overall lower leg is improving -Anticipate that she will need another 24 hours of IV antibiotics.  Likely discharge tomorrow if continues to improve  Hypertension -Continue home regimen of carvedilol -Use hydralazine as needed  Chronic combined CHF -Clinically appears compensated -Continue outpatient cardiac regimen  Paroxysmal atrial fibrillation -Heart rate is currently stable, continue Coreg -Not on anticoagulation due to high fall risk -Continue aspirin  Hyperlipidemia -Continue statin  Dementia -Continue Aricept and Namenda   DVT prophylaxis: enoxaparin  (LOVENOX) injection 40 mg Start: 05/29/20 2100  Code Status: Full code Family Communication: Discussed with son at the bedside Disposition Plan: Status is: Inpatient  Remains inpatient appropriate because:IV treatments appropriate due to intensity of illness or inability to take PO   Dispo: The patient is from: ALF              Anticipated d/c is to: ALF              Anticipated d/c date is: 1 day              Patient currently is not medically stable to d/c.     Consultants:     Procedures:     Antimicrobials:   Vancomycin 11/15 >  Meropenem 11/15 >   Subjective: No new complaints.  Objective: Vitals:   05/30/20 0019 05/30/20 0420 05/30/20 0900 05/30/20 1439  BP: 117/63 124/81 (!) 119/57 (!) 156/61  Pulse: 91 92 67 61  Resp: 16 16 16 18   Temp: 98 F (36.7 C) 97.6 F (36.4 C) 97.8 F (36.6 C) (!) 97.5 F (36.4 C)  TempSrc: Oral Oral  Oral  SpO2: 91% 94% 97% 97%  Weight:  66 kg      Intake/Output Summary (Last 24 hours) at 05/30/2020 1747 Last data filed at 05/30/2020 1700 Gross per 24 hour  Intake 590 ml  Output 350 ml  Net 240 ml   Filed Weights   05/29/20 0927 05/30/20 0420  Weight: 68 kg 66 kg    Examination:  General exam: Appears calm and comfortable  Respiratory system: Clear to auscultation. Respiratory effort normal. Cardiovascular system: S1 & S2 heard, RRR. No JVD, murmurs, rubs, gallops or clicks. No pedal edema. Gastrointestinal system:  Abdomen is nondistended, soft and nontender. No organomegaly or masses felt. Normal bowel sounds heard. Central nervous system: Alert and oriented. No focal neurological deficits. Extremities: Symmetric 5 x 5 power. Skin: Erythema over right lower leg, slowly improving Psychiatry: Judgement and insight appear normal. Mood & affect appropriate.     Data Reviewed: I have personally reviewed following labs and imaging studies  CBC: Recent Labs  Lab 05/29/20 0952 05/30/20 0747  WBC 13.2* 10.7*    NEUTROABS 9.7*  --   HGB 10.3* 9.2*  HCT 31.7* 30.1*  MCV 70.6* 65.7*  PLT 331 163   Basic Metabolic Panel: Recent Labs  Lab 05/29/20 0952 05/30/20 0747  NA 135 139  K 4.0 3.7  CL 98 104  CO2 29 26  GLUCOSE 138* 102*  BUN 11 13  CREATININE 0.75 0.68  CALCIUM 9.0 8.5*   GFR: Estimated Creatinine Clearance: 41.5 mL/min (by C-G formula based on SCr of 0.68 mg/dL). Liver Function Tests: No results for input(s): AST, ALT, ALKPHOS, BILITOT, PROT, ALBUMIN in the last 168 hours. No results for input(s): LIPASE, AMYLASE in the last 168 hours. No results for input(s): AMMONIA in the last 168 hours. Coagulation Profile: No results for input(s): INR, PROTIME in the last 168 hours. Cardiac Enzymes: No results for input(s): CKTOTAL, CKMB, CKMBINDEX, TROPONINI in the last 168 hours. BNP (last 3 results) No results for input(s): PROBNP in the last 8760 hours. HbA1C: No results for input(s): HGBA1C in the last 72 hours. CBG: No results for input(s): GLUCAP in the last 168 hours. Lipid Profile: No results for input(s): CHOL, HDL, LDLCALC, TRIG, CHOLHDL, LDLDIRECT in the last 72 hours. Thyroid Function Tests: No results for input(s): TSH, T4TOTAL, FREET4, T3FREE, THYROIDAB in the last 72 hours. Anemia Panel: No results for input(s): VITAMINB12, FOLATE, FERRITIN, TIBC, IRON, RETICCTPCT in the last 72 hours. Sepsis Labs: Recent Labs  Lab 05/29/20 8466  LATICACIDVEN 1.5    Recent Results (from the past 240 hour(s))  Culture, blood (routine x 2)     Status: None (Preliminary result)   Collection Time: 05/29/20  9:52 AM   Specimen: BLOOD RIGHT FOREARM  Result Value Ref Range Status   Specimen Description BLOOD RIGHT FOREARM DRAWN BY RN MEGAN  Final   Special Requests   Final    BOTTLES DRAWN AEROBIC AND ANAEROBIC Blood Culture adequate volume   Culture   Final    NO GROWTH < 24 HOURS Performed at [facility], [address] [place], [place] [address]    Report Status PENDING   Incomplete  Respiratory Panel by RT PCR (Flu A&B, Covid) - Nasopharyngeal Swab     Status: None   Collection Time: 05/29/20 12:00 PM   Specimen: Nasopharyngeal Swab  Result Value Ref Range Status   SARS Coronavirus 2 by RT PCR NEGATIVE NEGATIVE Final    Comment: (NOTE) SARS-CoV-2 target nucleic acids are NOT DETECTED.  The SARS-CoV-2 RNA is generally detectable in upper respiratoy specimens during the acute phase of infection. The lowest concentration of SARS-CoV-2 viral copies this assay can detect is 131 copies/mL. A negative result does not preclude SARS-Cov-2 infection and should not be used as the sole basis for treatment or other patient management decisions. A negative result may occur with  improper specimen collection/handling, submission of specimen other than nasopharyngeal swab, presence of viral mutation(s) within the areas targeted by this assay, and inadequate number of viral copies (<131 copies/mL). A negative result must be combined with clinical observations, patient history,  and epidemiological information. The expected result is Negative.  Fact Sheet for Patients:  PinkCheek.be  Fact Sheet for Healthcare Providers:  GravelBags.it  This test is no t yet approved or cleared by the [place] FDA and  has been authorized for detection and/or diagnosis of SARS-CoV-2 by FDA under an Emergency Use Authorization (EUA). This EUA will remain  in effect (meaning this test can be used) for the duration of the COVID-19 declaration under Section 564(b)(1) of the Act, 21 U.S.C. section 360bbb-3(b)(1), unless the authorization is terminated or revoked sooner.     Influenza A by PCR NEGATIVE NEGATIVE Final   Influenza B by PCR NEGATIVE NEGATIVE Final    Comment: (NOTE) The Xpert Xpress SARS-CoV-2/FLU/RSV assay is intended as an aid in  the diagnosis of influenza from Nasopharyngeal swab specimens and  should not  be used as a sole basis for treatment. Nasal washings and  aspirates are unacceptable for Xpert Xpress SARS-CoV-2/FLU/RSV  testing.  Fact Sheet for Patients: PinkCheek.be  Fact Sheet for Healthcare Providers: GravelBags.it  This test is not yet approved or cleared by the [place] FDA and  has been authorized for detection and/or diagnosis of SARS-CoV-2 by  FDA under an Emergency Use Authorization (EUA). This EUA will remain  in effect (meaning this test can be used) for the duration of the  Covid-19 declaration under Section 564(b)(1) of the Act, 21  U.S.C. section 360bbb-3(b)(1), unless the authorization is  terminated or revoked. Performed at [facility], [address] [place], [place] [address]          Radiology Studies: CT TIBIA FIBULA RIGHT W CONTRAST  Result Date: 05/29/2020 CLINICAL DATA:  Soft tissue infection surrounding the lower extremity EXAM: CT OF THE LOWER RIGHT EXTREMITY WITH CONTRAST TECHNIQUE: Multidetector CT imaging of the lower right extremity was performed according to the standard protocol following intravenous contrast administration. COMPARISON:  None. CONTRAST:  150mL OMNIPAQUE IOHEXOL 300 MG/ML  SOLN FINDINGS: Bones/Joint/Cartilage No fracture or dislocation. No areas of cortical destruction or periosteal reaction are seen. No large joint effusions. Ligaments Suboptimally assessed by CT. Muscles and Tendons The muscles surrounding the lower extremity appear to be intact. No focal atrophy or tear. The visualized portion tendons are. Soft tissues There is subcutaneous edema with non loculated fluid extending to the deep fascial layers along the anterior and lateral aspect of the mid to distal lower extremity. No loculated fluid collection is noted. There is overlying skin thickening. No subcutaneous emphysema. IMPRESSION: Findings suggestive diffuse cellulitis along the anterolateral aspect of the  mid to distal lower extremity. No loculated fluid collections or subcutaneous emphysema. Electronically Signed   By: Prudencio Pair M.D.   On: 05/29/2020 18:15   US Venous Img Lower Unilateral Right  Result Date: 05/29/2020 CLINICAL DATA:  86 year old with right lower extremity pain. Cellulitis and open wound. EXAM: RIGHT LOWER EXTREMITY VENOUS DOPPLER ULTRASOUND TECHNIQUE: Gray-scale sonography with graded compression, as well as color Doppler and duplex ultrasound were performed to evaluate the lower extremity deep venous systems from the level of the common femoral vein and including the common femoral, femoral, profunda femoral, popliteal and calf veins including the posterior tibial, peroneal and gastrocnemius veins when visible. The superficial great saphenous vein was also interrogated. Spectral Doppler was utilized to evaluate flow at rest and with distal augmentation maneuvers in the common femoral, femoral and popliteal veins. COMPARISON:  05/12/2013 FINDINGS: Contralateral Common Femoral Vein: Respiratory phasicity is normal and symmetric with the symptomatic side. No  evidence of thrombus. Normal compressibility. Common Femoral Vein: No evidence of thrombus. Normal compressibility, respiratory phasicity and response to augmentation. Saphenofemoral Junction: No evidence of thrombus. Normal compressibility and flow on color Doppler imaging. Profunda Femoral Vein: No evidence of thrombus. Normal compressibility and flow on color Doppler imaging. Femoral Vein: No evidence of thrombus. Normal compressibility, respiratory phasicity and response to augmentation. Popliteal Vein: No evidence of thrombus. Normal compressibility, respiratory phasicity and response to augmentation. Calf Veins: Visualized right deep calf veins are patent without thrombus. Superficial Great Saphenous Vein: No evidence of thrombus. Normal compressibility. Other Findings:  None. IMPRESSION: Negative for deep venous thrombosis in right  lower extremity. Electronically Signed   By: Markus Daft M.D.   On: 05/29/2020 11:07        Scheduled Meds: . aspirin  325 mg Oral Daily  . carvedilol  6.25 mg Oral BID WC  . cycloSPORINE  1 drop Both Eyes BID  . donepezil  5 mg Oral QHS  . enoxaparin (LOVENOX) injection  40 mg Subcutaneous Q24H  . isosorbide mononitrate  15 mg Oral QPM  . losartan  25 mg Oral Daily  . memantine  10 mg Oral BID  . nortriptyline  25 mg Oral QHS  . senna-docusate  2 tablet Oral QHS  . simvastatin  40 mg Oral QPM  . spironolactone  12.5 mg Oral Daily  . tamsulosin  0.4 mg Oral QPC supper   Continuous Infusions: . meropenem (MERREM) IV Stopped (05/30/20 1659)  . vancomycin 1,000 mg (05/30/20 0914)     LOS: 1 day    Time spent: 37mins    Kathie Dike, MD [facility]   If 7PM-7AM, please contact night-coverage [email or web address]  05/30/2020, 5:47 PM

## 2020-05-30 NOTE — Consult Note (Signed)
WOC Nurse Consult Note: Reason for Consult: Full thickness wound to right LE anterior aspect Wound type:Infectious Pressure Injury POA: N/A Measurement: To be obtained by bedside RN today prior to placement of first dressing change and documents on Nursing Flow Sheet Wound bed: Red, moist with peeling epidermis at periphery. Central area of greater depth is round, slightly elevated (hypergranulated) Drainage (amount, consistency, odor) serous, moderate Periwound:edematous, erythematous Dressing procedure/placement/frequency: I have provided a topical POC for Nursing to include twice daily soap and water cleansing, rinsing and gently patting dry. The dressing will be a nonadherent, antimicrobial (Xeroform) and securement with a few turns of Kerlix roll gauze.  Systemic antibiotics will address the underlying infection.  [facility] nursing team will not follow, but will remain available to this patient, the nursing and medical teams.  Please re-consult if needed. Thanks, Maudie Flakes, MSN, RN, [place], Arther Abbott  Pager# [phone]

## 2020-05-31 DIAGNOSIS — L03115 Cellulitis of right lower limb: Secondary | ICD-10-CM | POA: Diagnosis not present

## 2020-05-31 LAB — BASIC METABOLIC PANEL
Anion gap: 9 (ref 5–15)
BUN: 15 mg/dL (ref 8–23)
CO2: 27 mmol/L (ref 22–32)
Calcium: 8.1 mg/dL — ABNORMAL LOW (ref 8.9–10.3)
Chloride: 105 mmol/L (ref 98–111)
Creatinine, Ser: 0.73 mg/dL (ref 0.44–1.00)
GFR, Estimated: >60 mL/min (ref >60)
Glucose, Bld: 100 mg/dL — ABNORMAL HIGH (ref 70–99)
Potassium: 3.8 mmol/L (ref 3.5–5.1)
Sodium: 141 mmol/L (ref 135–145)

## 2020-05-31 LAB — CBC
HCT: 30 % — ABNORMAL LOW (ref 36.0–46.0)
Hemoglobin: 9 g/dL — ABNORMAL LOW (ref 12.0–15.0)
MCH: 21.2 pg — ABNORMAL LOW (ref 26.0–34.0)
MCHC: 30 g/dL (ref 30.0–36.0)
MCV: 70.6 fL — ABNORMAL LOW (ref 80.0–100.0)
Platelets: 305 10*3/uL (ref 150–400)
RBC: 4.25 MIL/uL (ref 3.87–5.11)
RDW: 36.5 % — ABNORMAL HIGH (ref 11.5–15.5)
WBC: 9.5 10*3/uL (ref 4.0–10.5)
nRBC: 0 % (ref 0.0–0.2)

## 2020-05-31 MED ORDER — CEPHALEXIN 500 MG PO CAPS: 500.0000 mg | ORAL_CAPSULE | Freq: Four times a day (QID) | ORAL | 0 refills | Status: AC

## 2020-05-31 NOTE — TOC Transition Note (Addendum)
Transition of Care [facility]) - CM/SW Discharge Note   Patient Details  Name: [name] MRN: [id number] Date of Birth: [birth year]  [facility]) CM/SW Contact:  Shade Flood, LCSW Phone Number: 05/31/2020, 11:27 AM   Clinical Narrative:     Pt admitted from [facility] ALF. Pt stable for dc today per MD. Pt will need continued [facility] RN for wound care. Updated Sharyn Lull at [facility]. Will fax dc clinical. Updated pt's son, Dionne Bucy, who is in agreement with the dc plan. Nanine Means will transport. Updated [facility] and faxed [facility] orders with dc summary as requested.  No other TOC needs for dc.  Expected Discharge Plan: Assisted Living Barriers to Discharge: Barriers Resolved   Patient Goals and CMS Choice        Expected Discharge Plan and Services Expected Discharge Plan: Assisted Living In-house Referral: Clinical Social Work     Living arrangements for the past 2 months: [place] Expected Discharge Date: 05/31/20                                    Prior Living Arrangements/Services Living arrangements for the past 2 months: [place] Lives with:: Facility Resident Patient language and need for interpreter reviewed:: Yes Do you feel safe going back to the place where you live?: Yes      Need for Family Participation in Patient Care: No (Comment) Care giver support system in place?: Yes (comment) Current home services: Home RN Criminal Activity/Legal Involvement Pertinent to Current Situation/Hospitalization: No - Comment as needed  Activities of Daily Living Home Assistive Devices/Equipment: Environmental consultant (specify type), Wheelchair ADL Screening (condition at time of admission) Patient's cognitive ability adequate to safely complete daily activities?: Yes Is the patient deaf or have difficulty hearing?: No Does the patient have difficulty seeing, even when wearing glasses/contacts?: Yes Does the patient have difficulty  concentrating, remembering, or making decisions?: Yes (h/o dementia) Patient able to express need for assistance with ADLs?: Yes Does the patient have difficulty dressing or bathing?: Yes Independently performs ADLs?: No Communication: Independent Dressing (OT): Needs assistance Is this a change from baseline?: Pre-admission baseline Grooming: Needs assistance Is this a change from baseline?: Pre-admission baseline Feeding: Independent Is this a change from baseline?: Pre-admission baseline Bathing: Needs assistance Is this a change from baseline?: Pre-admission baseline Toileting: Needs assistance Is this a change from baseline?: Pre-admission baseline In/Out Bed: Needs assistance Is this a change from baseline?: Pre-admission baseline Walks in Home: Needs assistance Is this a change from baseline?: Pre-admission baseline Does the patient have difficulty walking or climbing stairs?: Yes Weakness of Legs: Right Weakness of Arms/Hands: None  Permission Sought/Granted                  Emotional Assessment       Orientation: : Oriented to Self Alcohol / Substance Use: Not Applicable Psych Involvement: No (comment)  Admission diagnosis:  Cellulitis [L03.90] Cellulitis of right lower extremity [L03.115] Failure of outpatient treatment [Z78.9] Patient Active Problem List   Diagnosis Date Noted  . Cellulitis 05/29/2020  . Chronic combined systolic and diastolic CHF (congestive heart failure) ([place]) 05/29/2020  . Closed fracture of one rib of left side   . History of CVA (cerebrovascular accident) 02/24/2020  . Fall at home, initial encounter 02/24/2020  . Splenic laceration 02/24/2020  . History of atrial fibrillation 02/24/2020  . Hypertensive urgency 02/23/2020  . Microcytic  anemia 01/28/2020  . Acute lower UTI 01/28/2020  . Acute CVA (cerebrovascular accident) ([place]) 01/27/2020  . Cardiomyopathy ([place]) 11/17/2019  . Ascending aortic aneurysm ([place]) 11/17/2019  .  NSTEMI (non-ST elevated myocardial infarction) ([place]) 11/16/2019  . Chest pain 11/15/2019  . Postoperative anemia due to acute blood loss 09/08/2016  . Closed intertrochanteric fracture of left hip, initial encounter ([place]) 09/05/2016  . Dementia ([place]) 09/05/2016  . Abdominal pain 01/10/2011  . DERANGEMENT OF POSTERIOR HORN OF MEDIAL MENISCUS 09/25/2010  . CLOSED DISLOCATION OF DISTAL RADIOULNAR 03/28/2010  . COLONIC POLYPS, ADENOMATOUS, HX OF 07/31/2009  . PAROXYSMAL ATRIAL FIBRILLATION 10/26/2008  . Deficiency anemia 10/25/2008  . OTHER THALASSEMIA 10/25/2008  . DIVERTICULITIS, COLON 10/25/2008  . Diabetes mellitus due to underlying condition, controlled ([place]) 10/24/2008  . HYPERCHOLESTEROLEMIA 10/24/2008  . Iron deficiency anemia 10/24/2008  . Essential hypertension 10/24/2008  . Osteoporosis 10/24/2008  . HYPERTHYROIDISM, HX OF 10/24/2008   PCP:  Tilda Burrow, NP Pharmacy:   [facility], [place] [address] Phone: [phone] Fax: [phone]  [place], [place] [address] Phone: [phone] Fax: [phone]     Social Determinants of Health (SDOH) Interventions    Readmission Risk Interventions Readmission Risk Prevention Plan 05/31/2020  Transportation Screening Complete  HRI or [place] Complete  Social Work Consult for [place] Planning/Counseling Complete  Palliative Care Screening Not Applicable  Medication Review Press photographer) Complete  Some recent data might be hidden    Final next level of care: Assisted Living Barriers to Discharge: Barriers Resolved   Patient Goals and CMS Choice        Discharge Placement                       Discharge Plan and Services In-house Referral: Clinical Social Work                                   Social Determinants of  Health (SDOH) Interventions     Readmission Risk Interventions Readmission Risk Prevention Plan 05/31/2020  Transportation Screening Complete  HRI or [place] Complete  Social Work Consult for [place] Planning/Counseling Complete  Palliative Care Screening Not Applicable  Medication Review Press photographer) Complete  Some recent data might be hidden

## 2020-05-31 NOTE — Discharge Summary (Signed)
Physician Discharge Summary  SEVILLE BRICK NLZ:[id number] DOB: May 15, 1929 DOA: 05/29/2020  PCP: Tilda Burrow, NP  Admit date: 05/29/2020  Discharge date: 05/31/2020  Admitted From:ALF  Disposition:  ALF  Recommendations for Outpatient Follow-up:  1. Follow up with PCP in 1-2 weeks 2. Continue on Keflex as prescribed for right lower extremity cellulitis for 12 more days to complete course of treatment 3. Continue on other home medications as prior 4. Wound care twice daily as noted  Home Health: Yes with RN  Equipment/Devices: None  Discharge Condition: Stable  CODE STATUS: DNR  Diet recommendation: Heart Healthy  Brief/Interim Summary: [name] a 86 y.o.femalewith medical history significant ofatrial fibrillation, chronic combined CHF, dementia, hypertension, hyperlipidemia, who is a resident of an assisted living facility. Patient and family report that she has had a chronic right lower extremity wound on the anterior aspect of her lower leg for quite some time now. Over the past 2 weeks, her leg has become increasingly swollen, has become tender, and erythema around wound. She started a course of clindamycin on 11/5-11/11, without significant improvement of her infection. She has occasionally felt dizzy and lightheaded. No nausea, vomiting, diarrhea, no shortness of breath, no fevers.  -Patient was admitted with right lower extremity cellulitis with failed outpatient treatment on clindamycin.  Patient was treated with vancomycin and Merrem while inpatient and venous Dopplers were evaluated and negative for DVT.  Her lower extremity has demonstrated signs of improvement and she appears stable for discharge today on her usual medications as well as Keflex for 12 more days to complete course of treatment as discussed with ID.  She has had no other acute events or concerns throughout the course of this brief admission.  Son is at bedside and care plan was  discussed.  Discharge Diagnoses:  Active Problems:   HYPERCHOLESTEROLEMIA   Essential hypertension   PAROXYSMAL ATRIAL FIBRILLATION   Dementia (HCC)   Cardiomyopathy (HCC)   Cellulitis   Chronic combined systolic and diastolic CHF (congestive heart failure) ([facility])  Principal discharge diagnosis: Acute cellulitis to right lower extremity wound with failed outpatient treatment.  Discharge Instructions  Discharge Instructions    Diet - low sodium heart healthy   Complete by: As directed    Discharge wound care:   Complete by: As directed    Wound care to right anterior LE full thickness wound:  Cleanse with soap and water, rinse and pat gently dry. Cover with folded piece of xeroform gauze [name] # [id number]). Tpp with ABD and secure with a few turns of Kerlix roll gauze/paper tape.  Change twice daily.   Increase activity slowly   Complete by: As directed      Allergies as of 05/31/2020      Reactions   Bee Venom    Listed per Nursing Center-No reaction is noted   Procaine Hcl Other (See Comments)   Numbness/jerkiness   Sulfonamide Derivatives Other (See Comments)   hypertension   Penicillins Rash      Medication List    TAKE these medications   acetaminophen 325 MG tablet Commonly known as: TYLENOL Take 2 tablets (650 mg total) by mouth every 6 (six) hours as needed for mild pain, fever or headache.   acetaminophen 325 MG tablet Commonly known as: TYLENOL Take 2 tablets (650 mg total) by mouth every 6 (six) hours as needed for mild pain (or Fever >/= 101).   alendronate 70 MG tablet Commonly known as: FOSAMAX Take 70 mg by  mouth every Wednesday. Take with a full glass of water on an empty stomach.   aspirin 325 MG EC tablet Take 1 tablet (325 mg total) by mouth daily.   calcium citrate-vitamin D 500-400 MG-UNIT chewable tablet Chew by mouth 2 (two) times daily.   carvedilol 6.25 MG tablet Commonly known as: COREG Take 1 tablet (6.25 mg total) by mouth 2 (two)  times daily with a meal.   cephALEXin 500 MG capsule Commonly known as: KEFLEX Take 1 capsule (500 mg total) by mouth 4 (four) times daily for 12 days.   Cranberry 450 MG Tabs Take 450 mg by mouth 2 (two) times daily.   cycloSPORINE 0.05 % ophthalmic emulsion Commonly known as: RESTASIS Place 1 drop into both eyes 2 (two) times daily.   donepezil 5 MG tablet Commonly known as: ARICEPT Take 1 tablet (5 mg total) by mouth at bedtime.   ferrous sulfate 325 (65 FE) MG tablet Take 325 mg by mouth in the morning and at bedtime.   fish oil-omega-3 fatty acids 1000 MG capsule Take 2 g by mouth daily.   isosorbide mononitrate 30 MG 24 hr tablet Commonly known as: IMDUR Take 0.5 tablets (15 mg total) by mouth every evening.   losartan 50 MG tablet Commonly known as: COZAAR Take 1 tablet (50 mg total) by mouth daily. What changed: how much to take   meclizine 12.5 MG tablet Commonly known as: ANTIVERT Take 12.5 mg by mouth every 6 (six) hours as needed for dizziness.   memantine 10 MG tablet Commonly known as: NAMENDA Take 1 tablet (10 mg total) by mouth 2 (two) times daily.   nortriptyline 25 MG capsule Commonly known as: PAMELOR Take 25 mg by mouth at bedtime.   senna-docusate 8.6-50 MG tablet Commonly known as: Senokot-S Take 2 tablets by mouth at bedtime.   simvastatin 40 MG tablet Commonly known as: ZOCOR Take 1 tablet (40 mg total) by mouth every evening.   spironolactone 25 MG tablet Commonly known as: ALDACTONE Take 0.5 tablets (12.5 mg total) by mouth daily.   tamsulosin 0.4 MG Caps capsule Commonly known as: FLOMAX Take 1 capsule (0.4 mg total) by mouth daily after supper.   traMADol 50 MG tablet Commonly known as: ULTRAM Take 50 mg by mouth every 6 (six) hours as needed for moderate pain.            Discharge Care Instructions  (From admission, onward)         Start     Ordered   05/31/20 0000  Discharge wound care:       Comments: Wound  care to right anterior LE full thickness wound:  Cleanse with soap and water, rinse and pat gently dry. Cover with folded piece of xeroform gauze [name] # [id number]). Tpp with ABD and secure with a few turns of Kerlix roll gauze/paper tape.  Change twice daily.   05/31/20 1041          Follow-up Information    Tilda Burrow, NP Follow up in 2 week(s).   Specialty: Family Medicine Contact information: [address] [place] [address] [phone]              Allergies  Allergen Reactions  . Bee Venom     Listed per Nursing Center-No reaction is noted  . Procaine Hcl Other (See Comments)    Numbness/jerkiness  . Sulfonamide Derivatives Other (See Comments)    hypertension  . Penicillins Rash    Consultations:  Phone consultation with ID Dr. Linus Salmons   Procedures/Studies: CT TIBIA FIBULA RIGHT W CONTRAST  Result Date: 05/29/2020 CLINICAL DATA:  Soft tissue infection surrounding the lower extremity EXAM: CT OF THE LOWER RIGHT EXTREMITY WITH CONTRAST TECHNIQUE: Multidetector CT imaging of the lower right extremity was performed according to the standard protocol following intravenous contrast administration. COMPARISON:  None. CONTRAST:  12mL OMNIPAQUE IOHEXOL 300 MG/ML  SOLN FINDINGS: Bones/Joint/Cartilage No fracture or dislocation. No areas of cortical destruction or periosteal reaction are seen. No large joint effusions. Ligaments Suboptimally assessed by CT. Muscles and Tendons The muscles surrounding the lower extremity appear to be intact. No focal atrophy or tear. The visualized portion tendons are. Soft tissues There is subcutaneous edema with non loculated fluid extending to the deep fascial layers along the anterior and lateral aspect of the mid to distal lower extremity. No loculated fluid collection is noted. There is overlying skin thickening. No subcutaneous emphysema. IMPRESSION: Findings suggestive diffuse cellulitis along the anterolateral aspect of the mid to  distal lower extremity. No loculated fluid collections or subcutaneous emphysema. Electronically Signed   By: Prudencio Pair M.D.   On: 05/29/2020 18:15   US Venous Img Lower Unilateral Right  Result Date: 05/29/2020 CLINICAL DATA:  86 year old with right lower extremity pain. Cellulitis and open wound. EXAM: RIGHT LOWER EXTREMITY VENOUS DOPPLER ULTRASOUND TECHNIQUE: Gray-scale sonography with graded compression, as well as color Doppler and duplex ultrasound were performed to evaluate the lower extremity deep venous systems from the level of the common femoral vein and including the common femoral, femoral, profunda femoral, popliteal and calf veins including the posterior tibial, peroneal and gastrocnemius veins when visible. The superficial great saphenous vein was also interrogated. Spectral Doppler was utilized to evaluate flow at rest and with distal augmentation maneuvers in the common femoral, femoral and popliteal veins. COMPARISON:  05/12/2013 FINDINGS: Contralateral Common Femoral Vein: Respiratory phasicity is normal and symmetric with the symptomatic side. No evidence of thrombus. Normal compressibility. Common Femoral Vein: No evidence of thrombus. Normal compressibility, respiratory phasicity and response to augmentation. Saphenofemoral Junction: No evidence of thrombus. Normal compressibility and flow on color Doppler imaging. Profunda Femoral Vein: No evidence of thrombus. Normal compressibility and flow on color Doppler imaging. Femoral Vein: No evidence of thrombus. Normal compressibility, respiratory phasicity and response to augmentation. Popliteal Vein: No evidence of thrombus. Normal compressibility, respiratory phasicity and response to augmentation. Calf Veins: Visualized right deep calf veins are patent without thrombus. Superficial Great Saphenous Vein: No evidence of thrombus. Normal compressibility. Other Findings:  None. IMPRESSION: Negative for deep venous thrombosis in right lower  extremity. Electronically Signed   By: Markus Daft M.D.   On: 05/29/2020 11:07     Discharge Exam: Vitals:   05/30/20 2238 05/31/20 0535  BP: (!) 158/55 (!) 161/64  Pulse: 63 61  Resp: 18 18  Temp: 98.4 F (36.9 C) 98 F (36.7 C)  SpO2: 96% 95%   Vitals:   05/30/20 1439 05/30/20 1928 05/30/20 2238 05/31/20 0535  BP: (!) 156/61  (!) 158/55 (!) 161/64  Pulse: 61  63 61  Resp: 18  18 18   Temp: (!) 97.5 F (36.4 C)  98.4 F (36.9 C) 98 F (36.7 C)  TempSrc: Oral  Oral Oral  SpO2: 97% (!) 85% 96% 95%  Weight:        General: Pt is alert, awake, not in acute distress Cardiovascular: RRR, S1/S2 +, no rubs, no gallops Respiratory: CTA bilaterally, no wheezing, no rhonchi Abdominal: Soft,  NT, ND, bowel sounds + Extremities: no edema, no cyanosis, right lower extremity and dressings clean dry and intact.    The results of significant diagnostics from this hospitalization (including imaging, microbiology, ancillary and laboratory) are listed below for reference.     Microbiology: Recent Results (from the past 240 hour(s))  Culture, blood (routine x 2)     Status: None (Preliminary result)   Collection Time: 05/29/20  9:52 AM   Specimen: BLOOD RIGHT FOREARM  Result Value Ref Range Status   Specimen Description BLOOD RIGHT FOREARM DRAWN BY RN [name]  Final   Special Requests   Final    BOTTLES DRAWN AEROBIC AND ANAEROBIC Blood Culture adequate volume   Culture   Final    NO GROWTH 2 DAYS Performed at [facility], [address] [place], [place] [address]    Report Status PENDING  Incomplete  Respiratory Panel by RT PCR (Flu A&B, Covid) - Nasopharyngeal Swab     Status: None   Collection Time: 05/29/20 12:00 PM   Specimen: Nasopharyngeal Swab  Result Value Ref Range Status   SARS Coronavirus 2 by RT PCR NEGATIVE NEGATIVE Final    Comment: (NOTE) SARS-CoV-2 target nucleic acids are NOT DETECTED.  The SARS-CoV-2 RNA is generally detectable in upper respiratoy specimens  during the acute phase of infection. The lowest concentration of SARS-CoV-2 viral copies this assay can detect is 131 copies/mL. A negative result does not preclude SARS-Cov-2 infection and should not be used as the sole basis for treatment or other patient management decisions. A negative result may occur with  improper specimen collection/handling, submission of specimen other than nasopharyngeal swab, presence of viral mutation(s) within the areas targeted by this assay, and inadequate number of viral copies (<131 copies/mL). A negative result must be combined with clinical observations, patient history, and epidemiological information. The expected result is Negative.  Fact Sheet for Patients:  PinkCheek.be  Fact Sheet for Healthcare Providers:  GravelBags.it  This test is no t yet approved or cleared by the [place] FDA and  has been authorized for detection and/or diagnosis of SARS-CoV-2 by FDA under an Emergency Use Authorization (EUA). This EUA will remain  in effect (meaning this test can be used) for the duration of the COVID-19 declaration under Section 564(b)(1) of the Act, 21 U.S.C. section 360bbb-3(b)(1), unless the authorization is terminated or revoked sooner.     Influenza A by PCR NEGATIVE NEGATIVE Final   Influenza B by PCR NEGATIVE NEGATIVE Final    Comment: (NOTE) The Xpert Xpress SARS-CoV-2/FLU/RSV assay is intended as an aid in  the diagnosis of influenza from Nasopharyngeal swab specimens and  should not be used as a sole basis for treatment. Nasal washings and  aspirates are unacceptable for Xpert Xpress SARS-CoV-2/FLU/RSV  testing.  Fact Sheet for Patients: PinkCheek.be  Fact Sheet for Healthcare Providers: GravelBags.it  This test is not yet approved or cleared by the [place] FDA and  has been authorized for detection and/or  diagnosis of SARS-CoV-2 by  FDA under an Emergency Use Authorization (EUA). This EUA will remain  in effect (meaning this test can be used) for the duration of the  Covid-19 declaration under Section 564(b)(1) of the Act, 21  U.S.C. section 360bbb-3(b)(1), unless the authorization is  terminated or revoked. Performed at [facility], [address], [place], [place] [address]      Labs: BNP (last 3 results) No results for input(s): BNP in the last 8760 hours. Basic Metabolic Panel:  Recent Labs  Lab 05/29/20 0952 05/30/20 0747 05/31/20 0649  NA 135 139 141  K 4.0 3.7 3.8  CL 98 104 105  CO2 29 26 27   GLUCOSE 138* 102* 100*  BUN 11 13 15   CREATININE 0.75 0.68 0.73  CALCIUM 9.0 8.5* 8.1*   Liver Function Tests: No results for input(s): AST, ALT, ALKPHOS, BILITOT, PROT, ALBUMIN in the last 168 hours. No results for input(s): LIPASE, AMYLASE in the last 168 hours. No results for input(s): AMMONIA in the last 168 hours. CBC: Recent Labs  Lab 05/29/20 0952 05/30/20 0747 05/31/20 0649  WBC 13.2* 10.7* 9.5  NEUTROABS 9.7*  --   --   HGB 10.3* 9.2* 9.0*  HCT 31.7* 30.1* 30.0*  MCV 70.6* 65.7* 70.6*  PLT 331 316 305   Cardiac Enzymes: No results for input(s): CKTOTAL, CKMB, CKMBINDEX, TROPONINI in the last 168 hours. BNP: Invalid input(s): POCBNP CBG: No results for input(s): GLUCAP in the last 168 hours. D-Dimer No results for input(s): DDIMER in the last 72 hours. Hgb A1c No results for input(s): HGBA1C in the last 72 hours. Lipid Profile No results for input(s): CHOL, HDL, LDLCALC, TRIG, CHOLHDL, LDLDIRECT in the last 72 hours. Thyroid function studies No results for input(s): TSH, T4TOTAL, T3FREE, THYROIDAB in the last 72 hours.  Invalid input(s): FREET3 Anemia work up No results for input(s): VITAMINB12, FOLATE, FERRITIN, TIBC, IRON, RETICCTPCT in the last 72 hours. Urinalysis    Component Value Date/Time   COLORURINE YELLOW 01/27/2020 2150   APPEARANCEUR  HAZY (A) 01/27/2020 2150   LABSPEC 1.003 (L) 01/27/2020 2150   PHURINE 8.0 01/27/2020 2150   GLUCOSEU NEGATIVE 01/27/2020 2150   HGBUR SMALL (A) 01/27/2020 2150   BILIRUBINUR NEGATIVE 01/27/2020 2150   KETONESUR NEGATIVE 01/27/2020 2150   PROTEINUR NEGATIVE 01/27/2020 2150   UROBILINOGEN 0.2 01/18/2011 2222   NITRITE POSITIVE (A) 01/27/2020 2150   LEUKOCYTESUR LARGE (A) 01/27/2020 2150   Sepsis Labs Invalid input(s): PROCALCITONIN,  WBC,  LACTICIDVEN Microbiology Recent Results (from the past 240 hour(s))  Culture, blood (routine x 2)     Status: None (Preliminary result)   Collection Time: 05/29/20  9:52 AM   Specimen: BLOOD RIGHT FOREARM  Result Value Ref Range Status   Specimen Description BLOOD RIGHT FOREARM DRAWN BY RN [name]  Final   Special Requests   Final    BOTTLES DRAWN AEROBIC AND ANAEROBIC Blood Culture adequate volume   Culture   Final    NO GROWTH 2 DAYS Performed at [facility], [address], [place], [place] [address]    Report Status PENDING  Incomplete  Respiratory Panel by RT PCR (Flu A&B, Covid) - Nasopharyngeal Swab     Status: None   Collection Time: 05/29/20 12:00 PM   Specimen: Nasopharyngeal Swab  Result Value Ref Range Status   SARS Coronavirus 2 by RT PCR NEGATIVE NEGATIVE Final    Comment: (NOTE) SARS-CoV-2 target nucleic acids are NOT DETECTED.  The SARS-CoV-2 RNA is generally detectable in upper respiratoy specimens during the acute phase of infection. The lowest concentration of SARS-CoV-2 viral copies this assay can detect is 131 copies/mL. A negative result does not preclude SARS-Cov-2 infection and should not be used as the sole basis for treatment or other patient management decisions. A negative result may occur with  improper specimen collection/handling, submission of specimen other than nasopharyngeal swab, presence of viral mutation(s) within the areas targeted by this assay, and inadequate number of viral copies (<131 copies/mL).  A  negative result must be combined with clinical observations, patient history, and epidemiological information. The expected result is Negative.  Fact Sheet for Patients:  PinkCheek.be  Fact Sheet for Healthcare Providers:  GravelBags.it  This test is no t yet approved or cleared by the [place] FDA and  has been authorized for detection and/or diagnosis of SARS-CoV-2 by FDA under an Emergency Use Authorization (EUA). This EUA will remain  in effect (meaning this test can be used) for the duration of the COVID-19 declaration under Section 564(b)(1) of the Act, 21 U.S.C. section 360bbb-3(b)(1), unless the authorization is terminated or revoked sooner.     Influenza A by PCR NEGATIVE NEGATIVE Final   Influenza B by PCR NEGATIVE NEGATIVE Final    Comment: (NOTE) The Xpert Xpress SARS-CoV-2/FLU/RSV assay is intended as an aid in  the diagnosis of influenza from Nasopharyngeal swab specimens and  should not be used as a sole basis for treatment. Nasal washings and  aspirates are unacceptable for Xpert Xpress SARS-CoV-2/FLU/RSV  testing.  Fact Sheet for Patients: PinkCheek.be  Fact Sheet for Healthcare Providers: GravelBags.it  This test is not yet approved or cleared by the [place] FDA and  has been authorized for detection and/or diagnosis of SARS-CoV-2 by  FDA under an Emergency Use Authorization (EUA). This EUA will remain  in effect (meaning this test can be used) for the duration of the  Covid-19 declaration under Section 564(b)(1) of the Act, 21  U.S.C. section 360bbb-3(b)(1), unless the authorization is  terminated or revoked. Performed at [facility], [address], [place], [place] [address]      Time coordinating discharge: 35 minutes  SIGNED:   Rodena Goldmann, DO [facility] 05/31/2020, 10:41 AM  If 7PM-7AM, please contact  night-coverage [email or web address]

## 2020-05-31 NOTE — NC FL2 (Signed)
[place] LEVEL OF CARE SCREENING TOOL     IDENTIFICATION  Patient Name: [name] Birthdate: 05-Jan-1929 Sex: female Admission Date (Current Location): 05/29/2020  [facility] and [place] Number:  [employer] and Address:  [facility] [address] [place]      Provider Number: [id number]  Attending Physician Name and Address:  Rodena Goldmann, DO  Relative Name and Phone Number:  Avleen Bordwell - Son [phone]    Current Level of Care: Hospital Recommended Level of Care: [place] Prior Approval Number:    Date Approved/Denied:   PASRR Number:    Discharge Plan: Domiciliary (Rest home)    Current Diagnoses: Patient Active Problem List   Diagnosis Date Noted  . Cellulitis 05/29/2020  . Chronic combined systolic and diastolic CHF (congestive heart failure) ([place]) 05/29/2020  . Closed fracture of one rib of left side   . History of CVA (cerebrovascular accident) 02/24/2020  . Fall at home, initial encounter 02/24/2020  . Splenic laceration 02/24/2020  . History of atrial fibrillation 02/24/2020  . Hypertensive urgency 02/23/2020  . Microcytic anemia 01/28/2020  . Acute lower UTI 01/28/2020  . Acute CVA (cerebrovascular accident) ([place]) 01/27/2020  . Cardiomyopathy ([place]) 11/17/2019  . Ascending aortic aneurysm ([place]) 11/17/2019  . NSTEMI (non-ST elevated myocardial infarction) ([place]) 11/16/2019  . Chest pain 11/15/2019  . Postoperative anemia due to acute blood loss 09/08/2016  . Closed intertrochanteric fracture of left hip, initial encounter ([place]) 09/05/2016  . Dementia ([place]) 09/05/2016  . Abdominal pain 01/10/2011  . DERANGEMENT OF POSTERIOR HORN OF MEDIAL MENISCUS 09/25/2010  . CLOSED DISLOCATION OF DISTAL RADIOULNAR 03/28/2010  . COLONIC POLYPS, ADENOMATOUS, HX OF 07/31/2009  . PAROXYSMAL ATRIAL FIBRILLATION 10/26/2008  . Deficiency anemia 10/25/2008  . OTHER THALASSEMIA 10/25/2008  .  DIVERTICULITIS, COLON 10/25/2008  . Diabetes mellitus due to underlying condition, controlled ([place]) 10/24/2008  . HYPERCHOLESTEROLEMIA 10/24/2008  . Iron deficiency anemia 10/24/2008  . Essential hypertension 10/24/2008  . Osteoporosis 10/24/2008  . HYPERTHYROIDISM, HX OF 10/24/2008    Orientation RESPIRATION BLADDER Height & Weight     Self  Normal Continent Weight: 145 lb 8.1 oz (66 kg) Height:     BEHAVIORAL SYMPTOMS/MOOD NEUROLOGICAL BOWEL NUTRITION STATUS      Continent Diet (heart healthy)  AMBULATORY STATUS COMMUNICATION OF NEEDS Skin   Extensive Assist Verbally Normal                       Personal Care Assistance Level of Assistance  Bathing, Feeding, Dressing Bathing Assistance: Maximum assistance Feeding assistance: Maximum assistance Dressing Assistance: Maximum assistance     Functional Limitations Info  Sight, Hearing, Speech Sight Info: Adequate Hearing Info: Adequate Speech Info: Adequate    SPECIAL CARE FACTORS FREQUENCY  PT (By licensed PT)     PT Frequency:  (none)              Contractures Contractures Info: Not present    Additional Factors Info  Code Status, Allergies   Allergies Info: Bee, carbapenems, cephalosporins, procaine, sufonamide, penicillins           Current Medications (05/31/2020):  This is the current hospital active medication list Current Facility-Administered Medications  Medication Dose Route Frequency Provider Last Rate Last Admin  . acetaminophen (TYLENOL) tablet 650 mg  650 mg Oral Q6H PRN Kathie Dike, MD   650 mg at 05/29/20 2247   Or  . acetaminophen (TYLENOL) suppository 650  mg  650 mg Rectal Q6H PRN Kathie Dike, MD      . aspirin EC tablet 325 mg  325 mg Oral Daily Kathie Dike, MD   325 mg at 05/31/20 0848  . carvedilol (COREG) tablet 6.25 mg  6.25 mg Oral BID WC Kathie Dike, MD   6.25 mg at 05/31/20 0847  . cycloSPORINE (RESTASIS) 0.05 % ophthalmic emulsion 1 drop  1 drop Both Eyes BID  Kathie Dike, MD   1 drop at 05/30/20 2110  . donepezil (ARICEPT) tablet 5 mg  5 mg Oral QHS Kathie Dike, MD   5 mg at 05/30/20 2107  . enoxaparin (LOVENOX) injection 40 mg  40 mg Subcutaneous Q24H Kathie Dike, MD   40 mg at 05/30/20 2106  . hydrALAZINE (APRESOLINE) injection 10 mg  10 mg Intravenous Q4H PRN Kathie Dike, MD      . isosorbide mononitrate (IMDUR) 24 hr tablet 15 mg  15 mg Oral QPM Kathie Dike, MD   15 mg at 05/30/20 1615  . losartan (COZAAR) tablet 25 mg  25 mg Oral Daily Kathie Dike, MD   25 mg at 05/31/20 0847  . memantine (NAMENDA) tablet 10 mg  10 mg Oral BID Kathie Dike, MD   10 mg at 05/31/20 0847  . meropenem (MERREM) 1 g in sodium chloride 0.9 % 100 mL IVPB  1 g Intravenous Q12H Kathie Dike, MD 200 mL/hr at 05/31/20 0624 1 g at 05/31/20 0624  . nortriptyline (PAMELOR) capsule 25 mg  25 mg Oral QHS Kathie Dike, MD   25 mg at 05/30/20 2106  . ondansetron (ZOFRAN) tablet 4 mg  4 mg Oral Q6H PRN Kathie Dike, MD       Or  . ondansetron (ZOFRAN) injection 4 mg  4 mg Intravenous Q6H PRN Kathie Dike, MD      . senna-docusate (Senokot-S) tablet 2 tablet  2 tablet Oral QHS Kathie Dike, MD   2 tablet at 05/30/20 2107  . simvastatin (ZOCOR) tablet 40 mg  40 mg Oral QPM Kathie Dike, MD   40 mg at 05/30/20 1615  . spironolactone (ALDACTONE) tablet 12.5 mg  12.5 mg Oral Daily Kathie Dike, MD   12.5 mg at 05/31/20 0849  . tamsulosin (FLOMAX) capsule 0.4 mg  0.4 mg Oral QPC supper Kathie Dike, MD   0.4 mg at 05/30/20 1615  . traMADol (ULTRAM) tablet 50 mg  50 mg Oral Q6H PRN Kathie Dike, MD   50 mg at 05/30/20 1615  . vancomycin (VANCOCIN) IVPB 1000 mg/200 mL premix  1,000 mg Intravenous Q24H Kathie Dike, MD 200 mL/hr at 05/30/20 0914 1,000 mg at 05/30/20 0914     Discharge Medications:  TAKE these medications       acetaminophen 325 MG tablet Commonly known as: TYLENOL Take 2 tablets (650 mg total) by mouth every 6  (six) hours as needed for mild pain, fever or headache.   acetaminophen 325 MG tablet Commonly known as: TYLENOL Take 2 tablets (650 mg total) by mouth every 6 (six) hours as needed for mild pain (or Fever >/= 101).   alendronate 70 MG tablet Commonly known as: FOSAMAX Take 70 mg by mouth every Wednesday. Take with a full glass of water on an empty stomach.   aspirin 325 MG EC tablet Take 1 tablet (325 mg total) by mouth daily.   calcium citrate-vitamin D 500-400 MG-UNIT chewable tablet Chew by mouth 2 (two) times daily.   carvedilol 6.25 MG tablet Commonly known as:  COREG Take 1 tablet (6.25 mg total) by mouth 2 (two) times daily with a meal.   cephALEXin 500 MG capsule Commonly known as: KEFLEX Take 1 capsule (500 mg total) by mouth 4 (four) times daily for 12 days.   Cranberry 450 MG Tabs Take 450 mg by mouth 2 (two) times daily.   cycloSPORINE 0.05 % ophthalmic emulsion Commonly known as: RESTASIS Place 1 drop into both eyes 2 (two) times daily.   donepezil 5 MG tablet Commonly known as: ARICEPT Take 1 tablet (5 mg total) by mouth at bedtime.   ferrous sulfate 325 (65 FE) MG tablet Take 325 mg by mouth in the morning and at bedtime.   fish oil-omega-3 fatty acids 1000 MG capsule Take 2 g by mouth daily.   isosorbide mononitrate 30 MG 24 hr tablet Commonly known as: IMDUR Take 0.5 tablets (15 mg total) by mouth every evening.   losartan 50 MG tablet Commonly known as: COZAAR Take 1 tablet (50 mg total) by mouth daily. What changed: how much to take   meclizine 12.5 MG tablet Commonly known as: ANTIVERT Take 12.5 mg by mouth every 6 (six) hours as needed for dizziness.   memantine 10 MG tablet Commonly known as: NAMENDA Take 1 tablet (10 mg total) by mouth 2 (two) times daily.   nortriptyline 25 MG capsule Commonly known as: PAMELOR Take 25 mg by mouth at bedtime.   senna-docusate 8.6-50 MG tablet Commonly known as: Senokot-S Take 2  tablets by mouth at bedtime.   simvastatin 40 MG tablet Commonly known as: ZOCOR Take 1 tablet (40 mg total) by mouth every evening.   spironolactone 25 MG tablet Commonly known as: ALDACTONE Take 0.5 tablets (12.5 mg total) by mouth daily.   tamsulosin 0.4 MG Caps capsule Commonly known as: FLOMAX Take 1 capsule (0.4 mg total) by mouth daily after supper.   traMADol 50 MG tablet Commonly known as: ULTRAM Take 50 mg by mouth every 6 (six) hours as needed for moderate pain.                    Relevant Imaging Results:  Relevant Lab Results:   Additional Information SSN  [id number]  Shade Flood, LCSW

## 2020-06-03 LAB — CULTURE, BLOOD (ROUTINE X 2)
Culture: NO GROWTH
Special Requests: ADEQUATE

## 2020-11-05 ENCOUNTER — Encounter: Payer: Self-pay | Admitting: Cardiology

## 2020-11-05 NOTE — Progress Notes (Signed)
Cardiology Office Note  Date: 11/06/2020   ID: [name], DOB 1929/07/06, MRN [id number]  PCP:  Wendi Maya, MD  Cardiologist:  Rozann Lesches, MD Electrophysiologist:  None   Chief Complaint  Patient presents with  . Cardiac follow-up    History of Present Illness: [name] is a 87 y.o. female last assessed via telehealth encounter in October 2021 by Ms. Strader PA-C.  She is here today with an Environmental consultant from [facility].  No specific complaints, doing well in general.  She states that she enjoys the activities, has been eating well.  No palpitations or chest pain.  No recent falls.  She is using a rolling walker.  CHA2DS2-VASc score is 6, she has not been anticoagulated in the setting of dementia and history of falls.  I reviewed her medications and I recommended reducing aspirin to 81 mg daily, otherwise no change in current cardiac regimen.  Vital signs are stable today.  Past Medical History:  Diagnosis Date  . Anemia   . Atrial fibrillation ([place])   . Cardiomyopathy ([place])    a. 10/2019: EF 35-40% by echo --> medical management recommended.   . Dementia ([place])   . Depression   . Diverticulitis 8/09, 3/10   Abscess in 2009 (medical tx)  . Essential hypertension   . Hyperlipidemia   . Hyperthyroidism    Radiation, surgery  . IDA (iron deficiency anemia)   . Osteoporosis   . Postoperative anemia due to acute blood loss 09/08/2016  . Thalassemia   . Tubular adenoma of colon 12/15/08   Colonosocpy Dr Tonita Cong removed cecum  . Type 2 diabetes mellitus ([place])     Past Surgical History:  Procedure Laterality Date  . CESAREAN SECTION     x2  . CHOLECYSTECTOMY    . FOOT SURGERY     bilat  . INTRAMEDULLARY (IM) NAIL INTERTROCHANTERIC Left 09/06/2016   Procedure: LEFT HIP INTRAMEDULLARY (IM) NAIL;  Surgeon: Renette Butters, MD;  Location: [facility];  Service: Orthopedics;  Laterality: Left;  . THYROIDECTOMY, PARTIAL      Current Outpatient Medications   Medication Sig Dispense Refill  . acetaminophen (TYLENOL) 325 MG tablet Take 2 tablets (650 mg total) by mouth every 6 (six) hours as needed for mild pain, fever or headache. 30 tablet 1  . alendronate (FOSAMAX) 70 MG tablet Take 70 mg by mouth every Wednesday. Take with a full glass of water on an empty stomach.    Marland Kitchen aspirin EC 81 MG tablet Take 1 tablet (81 mg total) by mouth daily. Swallow whole. 90 tablet 3  . calcium citrate-vitamin D 500-400 MG-UNIT chewable tablet Chew by mouth 2 (two) times daily.    . calcium-vitamin D (OSCAL WITH D) 500-200 MG-UNIT tablet Take 1 tablet by mouth.    . carvedilol (COREG) 6.25 MG tablet Take 1 tablet (6.25 mg total) by mouth 2 (two) times daily with a meal. 60 tablet 3  . Cranberry 450 MG TABS Take 450 mg by mouth 2 (two) times daily.    . cycloSPORINE (RESTASIS) 0.05 % ophthalmic emulsion Place 1 drop into both eyes 2 (two) times daily.    Marland Kitchen donepezil (ARICEPT) 5 MG tablet Take 1 tablet (5 mg total) by mouth at bedtime. 30 tablet 3  . fish oil-omega-3 fatty acids 1000 MG capsule Take 2 g by mouth daily.    Marland Kitchen HYDROcodone-acetaminophen (NORCO/VICODIN) 5-325 MG tablet Take 1 tablet by mouth every 6 (six) hours as needed for  moderate pain.    . isosorbide mononitrate (IMDUR) 30 MG 24 hr tablet Take 0.5 tablets (15 mg total) by mouth every evening. 30 tablet 3  . losartan (COZAAR) 50 MG tablet Take 1 tablet (50 mg total) by mouth daily. (Patient taking differently: Take 25 mg by mouth daily.) 30 tablet 3  . meclizine (ANTIVERT) 12.5 MG tablet Take 12.5 mg by mouth every 6 (six) hours as needed for dizziness.    . memantine (NAMENDA) 10 MG tablet Take 1 tablet (10 mg total) by mouth 2 (two) times daily. 60 tablet 3  . nortriptyline (PAMELOR) 25 MG capsule Take 25 mg by mouth at bedtime.    . senna-docusate (SENOKOT-S) 8.6-50 MG tablet Take 2 tablets by mouth at bedtime. 60 tablet 11  . simvastatin (ZOCOR) 40 MG tablet Take 1 tablet (40 mg total) by mouth every  evening. 30 tablet 3  . spironolactone (ALDACTONE) 25 MG tablet Take 0.5 tablets (12.5 mg total) by mouth daily. 30 tablet 3  . tamsulosin (FLOMAX) 0.4 MG CAPS capsule Take 1 capsule (0.4 mg total) by mouth daily after supper. 30 capsule 0  . vitamin B-12 (CYANOCOBALAMIN) 500 MCG tablet Take 1,000 mcg by mouth daily.     No current facility-administered medications for this visit.   Allergies:  Bee venom, Procaine hcl, Sulfonamide derivatives, and Penicillins   ROS: No orthopnea or PND.  No syncope.  Physical Exam: VS:  BP 118/60   Pulse 64   Ht 5\' 4"  (1.626 m)   Wt 144 lb 6.4 oz (65.5 kg)   SpO2 96%   BMI 24.79 kg/m , BMI Body mass index is 24.79 kg/m.  Wt Readings from Last 3 Encounters:  11/06/20 144 lb 6.4 oz (65.5 kg)  05/30/20 145 lb 8.1 oz (66 kg)  05/02/20 143 lb (64.9 kg)    General: Pleasant elderly woman, appears comfortable at rest. HEENT: Conjunctiva and lids normal, wearing a mask. Neck: Supple, no elevated JVP or carotid bruits, no thyromegaly. Lungs: Clear to auscultation, nonlabored breathing at rest. Cardiac: Regular rate and rhythm, no S3, soft systolic murmur. Abdomen: Soft, nontender, bowel sounds present. Extremities: No pitting edema, distal pulses 2+.  ECG:  An ECG dated 02/24/2020 was personally reviewed today and demonstrated:  Sinus rhythm with nonspecific ST changes.  Recent Labwork: 02/24/2020: ALT 15; AST 15 02/25/2020: Magnesium 2.0 05/31/2020: BUN 15; Creatinine, Ser 0.73; Hemoglobin 9.0; Platelets 305; Potassium 3.8; Sodium 141     Component Value Date/Time   CHOL 144 01/27/2020 2055   TRIG 76 01/27/2020 2055   HDL 50 01/27/2020 2055   CHOLHDL 2.9 01/27/2020 2055   VLDL 15 01/27/2020 2055   LDLCALC 79 01/27/2020 2055   LDLCALC 68 01/04/2020 0945    Other Studies Reviewed Today:  Echocardiogram 11/16/2019: 1. Left ventricular ejection fraction, by estimation, is 35 to 40%. The  left ventricle has moderately decreased function. The  left ventricle  demonstrates global hypokinesis. The left ventricular internal cavity size  was mildly dilated. There is mild  left ventricular hypertrophy. Left ventricular diastolic parameters are  consistent with Grade I diastolic dysfunction (impaired relaxation).  2. Right ventricular systolic function is normal. The right ventricular  size is normal. Tricuspid regurgitation signal is inadequate for assessing  PA pressure.  3. Left atrial size was moderately dilated.  4. The mitral valve is grossly normal. Mild mitral valve regurgitation.  5. The aortic valve is tricuspid. Aortic valve regurgitation is mild.  6. The inferior vena cava is  normal in size with greater than 50%  respiratory variability, suggesting right atrial pressure of 3 mmHg.   Carotid Dopplers 01/29/2020: Summary:  Right Carotid: The extracranial vessels were near-normal with only minimal  wall         thickening or plaque.   Left Carotid: The extracranial vessels were near-normal with only minimal  wall        thickening or plaque.   Vertebrals: Bilateral vertebral arteries demonstrate antegrade flow.  Subclavians: Normal flow hemodynamics were seen in bilateral subclavian        arteries.   Assessment and Plan:  1.  Paroxysmal atrial fibrillation with CHA2DS2-VASc score of 6.  She is not anticoagulated with history of dementia and falls.  Continue aspirin with reduced dose to 81 mg daily, also current dose of Coreg.  She does not report any palpitations.  2.  Cardiomyopathy with LVEF 35 to 40%, managing conservatively at this time.  No evidence of fluid overload and NYHA class II dyspnea.  Continue Coreg, losartan, and Aldactone.  3.  Essential hypertension, blood pressure well controlled today on current regimen.  No changes were made.  Medication Adjustments/Labs and Tests Ordered: Current medicines are reviewed at length with the patient today.  Concerns regarding medicines  are outlined above.   Tests Ordered: No orders of the defined types were placed in this encounter.   Medication Changes: Meds ordered this encounter  Medications  . aspirin EC 81 MG tablet    Sig: Take 1 tablet (81 mg total) by mouth daily. Swallow whole.    Dispense:  90 tablet    Refill:  3    11/06/20 Decreased from 325 mg to 81 mg qd    Disposition:  Follow up 6 months.  Signed, Satira Sark, MD, [facility] 11/06/2020 9:46 AM    [facility] [place]. [address] [place], [place] [address] Phone: [phone]; Fax: [phone]

## 2020-11-06 ENCOUNTER — Other Ambulatory Visit: Payer: Self-pay

## 2020-11-06 ENCOUNTER — Ambulatory Visit (INDEPENDENT_AMBULATORY_CARE_PROVIDER_SITE_OTHER): Payer: Medicare HMO | Admitting: Cardiology

## 2020-11-06 ENCOUNTER — Encounter: Payer: Self-pay | Admitting: Cardiology

## 2020-11-06 VITALS — BP 118/60 | HR 64 | Ht 64.0 in | Wt 144.4 lb

## 2020-11-06 DIAGNOSIS — I10 Essential (primary) hypertension: Secondary | ICD-10-CM | POA: Diagnosis not present

## 2020-11-06 DIAGNOSIS — I5042 Chronic combined systolic (congestive) and diastolic (congestive) heart failure: Secondary | ICD-10-CM | POA: Diagnosis not present

## 2020-11-06 DIAGNOSIS — I480 Paroxysmal atrial fibrillation: Secondary | ICD-10-CM

## 2020-11-06 MED ORDER — ASPIRIN EC 81 MG PO TBEC: 81.0000 mg | DELAYED_RELEASE_TABLET | Freq: Every day | ORAL | 3 refills | Status: AC

## 2020-11-06 NOTE — Patient Instructions (Signed)
Medication Instructions:  DECREASE Aspirin to 81 mg daily   *If you need a refill on your cardiac medications before your next appointment, please call your pharmacy*   Lab Work: None today If you have labs (blood work) drawn today and your tests are completely normal, you will receive your results only by: Marland Kitchen MyChart Message (if you have MyChart) OR . A paper copy in the mail If you have any lab test that is abnormal or we need to change your treatment, we will call you to review the results.   Testing/Procedures: None today   Follow-Up: At [facility], you and your health needs are our priority.  As part of our continuing mission to provide you with exceptional heart care, we have created designated Provider Care Teams.  These Care Teams include your primary Cardiologist (physician) and Advanced Practice Providers (APPs -  Physician Assistants and Nurse Practitioners) who all work together to provide you with the care you need, when you need it.  We recommend signing up for the patient portal called "MyChart".  Sign up information is provided on this After Visit Summary.  MyChart is used to connect with patients for Virtual Visits (Telemedicine).  Patients are able to view lab/test results, encounter notes, upcoming appointments, etc.  Non-urgent messages can be sent to your provider as well.   To learn more about what you can do with MyChart, go to [email or web address].    Your next appointment:   6 month(s)  The format for your next appointment:   In Person  Provider:   Rozann Lesches, MD   Other Instructions None

## 2020-12-04 ENCOUNTER — Other Ambulatory Visit (HOSPITAL_COMMUNITY)
Admission: RE | Admit: 2020-12-04 | Discharge: 2020-12-04 | Disposition: A | Discharge status: Home / Self Care | Payer: Medicare HMO | Source: Other Acute Inpatient Hospital | Attending: Family Medicine | Admitting: Family Medicine

## 2020-12-04 DIAGNOSIS — N390 Urinary tract infection, site not specified: Secondary | ICD-10-CM | POA: Diagnosis present

## 2020-12-04 LAB — URINALYSIS, COMPLETE (UACMP) WITH MICROSCOPIC
Bilirubin Urine: NEGATIVE
Glucose, UA: NEGATIVE mg/dL
Hgb urine dipstick: NEGATIVE
Ketones, ur: NEGATIVE mg/dL
Nitrite: NEGATIVE
Protein, ur: NEGATIVE mg/dL
Specific Gravity, Urine: 1.008 (ref 1.005–1.030)
WBC, UA: >50 WBC/hpf — ABNORMAL HIGH (ref 0–5)
pH: 6 (ref 5.0–8.0)

## 2020-12-06 LAB — URINE CULTURE: Culture: <10000 — AB

## 2020-12-07 ENCOUNTER — Other Ambulatory Visit (HOSPITAL_COMMUNITY): Admission: RE | Admit: 2020-12-07 | Payer: Medicare HMO | Source: Home / Self Care

## 2022-05-02 ENCOUNTER — Emergency Department (HOSPITAL_COMMUNITY)
Admission: EM | Admit: 2022-05-02 | Discharge: 2022-05-02 | Disposition: A | Discharge status: Other Acute Inpatient Hospital | Payer: Medicare HMO | Attending: Emergency Medicine | Admitting: Emergency Medicine

## 2022-05-02 ENCOUNTER — Encounter (HOSPITAL_COMMUNITY): Payer: Self-pay | Admitting: Emergency Medicine

## 2022-05-02 ENCOUNTER — Emergency Department (HOSPITAL_COMMUNITY): Payer: Medicare HMO

## 2022-05-02 DIAGNOSIS — Z7982 Long term (current) use of aspirin: Secondary | ICD-10-CM | POA: Diagnosis not present

## 2022-05-02 DIAGNOSIS — I10 Essential (primary) hypertension: Secondary | ICD-10-CM | POA: Diagnosis not present

## 2022-05-02 DIAGNOSIS — Z79899 Other long term (current) drug therapy: Secondary | ICD-10-CM | POA: Insufficient documentation

## 2022-05-02 DIAGNOSIS — J069 Acute upper respiratory infection, unspecified: Secondary | ICD-10-CM | POA: Diagnosis not present

## 2022-05-02 DIAGNOSIS — W19XXXA Unspecified fall, initial encounter: Secondary | ICD-10-CM | POA: Insufficient documentation

## 2022-05-02 DIAGNOSIS — F0390 Unspecified dementia without behavioral disturbance: Secondary | ICD-10-CM | POA: Insufficient documentation

## 2022-05-02 DIAGNOSIS — S0990XA Unspecified injury of head, initial encounter: Secondary | ICD-10-CM | POA: Diagnosis present

## 2022-05-02 LAB — BASIC METABOLIC PANEL
Anion gap: 8 (ref 5–15)
BUN: 13 mg/dL (ref 8–23)
CO2: 26 mmol/L (ref 22–32)
Calcium: 8.8 mg/dL — ABNORMAL LOW (ref 8.9–10.3)
Chloride: 104 mmol/L (ref 98–111)
Creatinine, Ser: 0.86 mg/dL (ref 0.44–1.00)
GFR, Estimated: >60 mL/min (ref >60)
Glucose, Bld: 148 mg/dL — ABNORMAL HIGH (ref 70–99)
Potassium: 4.3 mmol/L (ref 3.5–5.1)
Sodium: 138 mmol/L (ref 135–145)

## 2022-05-02 LAB — CBC WITH DIFFERENTIAL/PLATELET
Abs Immature Granulocytes: 0.03 10*3/uL (ref 0.00–0.07)
Basophils Absolute: 0 10*3/uL (ref 0.0–0.1)
Basophils Relative: 0 %
Eosinophils Absolute: 0.1 10*3/uL (ref 0.0–0.5)
Eosinophils Relative: 1 %
HCT: 32.3 % — ABNORMAL LOW (ref 36.0–46.0)
Hemoglobin: 9.9 g/dL — ABNORMAL LOW (ref 12.0–15.0)
Immature Granulocytes: 0 %
Lymphocytes Relative: 11 %
Lymphs Abs: 1.2 10*3/uL (ref 0.7–4.0)
MCH: 19.7 pg — ABNORMAL LOW (ref 26.0–34.0)
MCHC: 30.7 g/dL (ref 30.0–36.0)
MCV: 64.3 fL — ABNORMAL LOW (ref 80.0–100.0)
Monocytes Absolute: 1.1 10*3/uL — ABNORMAL HIGH (ref 0.1–1.0)
Monocytes Relative: 10 %
Neutro Abs: 8 10*3/uL — ABNORMAL HIGH (ref 1.7–7.7)
Neutrophils Relative %: 78 %
Platelets: 205 10*3/uL (ref 150–400)
RBC: 5.02 MIL/uL (ref 3.87–5.11)
RDW: 15.4 % (ref 11.5–15.5)
WBC: 10.4 10*3/uL (ref 4.0–10.5)
nRBC: 0 % (ref 0.0–0.2)

## 2022-05-02 NOTE — ED Triage Notes (Signed)
Pt BIB [facility] from [facility] after pt had fall tonight at facility. Per staff they caught her and assisted her to the floor. Pt denies any pain or discomfort. Pt noted to have pinpoints pupils on EMS arrival.

## 2022-05-02 NOTE — Discharge Instructions (Signed)
Continue medications as previously prescribed.  Return to the emergency department if you experience any new and/or concerning symptoms.

## 2022-05-02 NOTE — ED Provider Notes (Signed)
[facility] EMERGENCY DEPARTMENT Provider Note   CSN: [id number] Arrival date & time: 05/02/22  0440     History  Chief Complaint  Patient presents with   Lytle Michaels    [name] is a 88 y.o. female.  Patient is a 88 year old female with past medical history of dementia, diabetes, hypertension, hyperlipidemia.  Patient sent from her extended care facility for evaluation of fall.  From what I am told, she attempted to get up to walk to the bathroom, became unsteady and had to be assisted to the floor.  Patient denies to me she is having any discomfort.  Due to her history of dementia, she has no recollection of why she is here.  Her facility reports that she has "not been herself today".  Patient also received vaccinations for COVID, influenza, and RSV yesterday.  The history is provided by the patient.       Home Medications Prior to Admission medications   Medication Sig Start Date End Date Taking? Authorizing Provider  acetaminophen (TYLENOL) 325 MG tablet Take 2 tablets (650 mg total) by mouth every 6 (six) hours as needed for mild pain, fever or headache. 11/18/19   Roxan Hockey, MD  alendronate (FOSAMAX) 70 MG tablet Take 70 mg by mouth every Wednesday. Take with a full glass of water on an empty stomach.    [provider]  aspirin EC 81 MG tablet Take 1 tablet (81 mg total) by mouth daily. Swallow whole. 11/06/20   Satira Sark, MD  calcium citrate-vitamin D 500-400 MG-UNIT chewable tablet Chew by mouth 2 (two) times daily.    [provider]  calcium-vitamin D (OSCAL WITH D) 500-200 MG-UNIT tablet Take 1 tablet by mouth.    [provider]  carvedilol (COREG) 6.25 MG tablet Take 1 tablet (6.25 mg total) by mouth 2 (two) times daily with a meal. 11/18/19   Emokpae, Courage, MD  Cranberry 450 MG TABS Take 450 mg by mouth 2 (two) times daily.    [provider]  cycloSPORINE (RESTASIS) 0.05 % ophthalmic emulsion Place 1 drop into both  eyes 2 (two) times daily.    [provider]  donepezil (ARICEPT) 5 MG tablet Take 1 tablet (5 mg total) by mouth at bedtime. 11/18/19   Roxan Hockey, MD  fish oil-omega-3 fatty acids 1000 MG capsule Take 2 g by mouth daily.    [provider]  HYDROcodone-acetaminophen (NORCO/VICODIN) 5-325 MG tablet Take 1 tablet by mouth every 6 (six) hours as needed for moderate pain.    [provider]  isosorbide mononitrate (IMDUR) 30 MG 24 hr tablet Take 0.5 tablets (15 mg total) by mouth every evening. 11/18/19   Roxan Hockey, MD  losartan (COZAAR) 50 MG tablet Take 1 tablet (50 mg total) by mouth daily. Patient taking differently: Take 25 mg by mouth daily. 11/19/19   Roxan Hockey, MD  meclizine (ANTIVERT) 12.5 MG tablet Take 12.5 mg by mouth every 6 (six) hours as needed for dizziness.    [provider]  memantine (NAMENDA) 10 MG tablet Take 1 tablet (10 mg total) by mouth 2 (two) times daily. 11/18/19   Roxan Hockey, MD  nortriptyline (PAMELOR) 25 MG capsule Take 25 mg by mouth at bedtime.    [provider]  simvastatin (ZOCOR) 40 MG tablet Take 1 tablet (40 mg total) by mouth every evening. 11/18/19   Roxan Hockey, MD  spironolactone (ALDACTONE) 25 MG tablet Take 0.5 tablets (12.5 mg total) by mouth  daily. 11/19/19   Roxan Hockey, MD  tamsulosin (FLOMAX) 0.4 MG CAPS capsule Take 1 capsule (0.4 mg total) by mouth daily after supper. 11/18/19   Roxan Hockey, MD  vitamin B-12 (CYANOCOBALAMIN) 500 MCG tablet Take 1,000 mcg by mouth daily.    [provider]      Allergies    Bee venom, Procaine hcl, Sulfonamide derivatives, and Penicillins    Review of Systems   Review of Systems  All other systems reviewed and are negative.   Physical Exam Updated Vital Signs BP (!) 176/58 (BP Location: Left Arm)   Pulse 85   Temp 99.6 F (37.6 C) (Rectal)   Resp 19   Ht '5\' 4"'$  (1.626 m)   Wt 65.5 kg   SpO2 93%   BMI 24.79 kg/m   Physical Exam Vitals and nursing note reviewed.  Constitutional:      General: She is not in acute distress.    Appearance: She is well-developed. She is not diaphoretic.  HENT:     Head: Normocephalic and atraumatic.  Eyes:     Extraocular Movements: Extraocular movements intact.     Pupils: Pupils are equal, round, and reactive to light.  Cardiovascular:     Rate and Rhythm: Normal rate and regular rhythm.     Heart sounds: No murmur heard.    No friction rub. No gallop.  Pulmonary:     Effort: Pulmonary effort is normal. No respiratory distress.     Breath sounds: Normal breath sounds. No wheezing.  Abdominal:     General: Bowel sounds are normal. There is no distension.     Palpations: Abdomen is soft.     Tenderness: There is no abdominal tenderness.  Musculoskeletal:        General: Normal range of motion.     Cervical back: Normal range of motion and neck supple.     Comments: She has full range of motion of both hips.  There is no deformity or leg shortening/rotation.  Skin:    General: Skin is warm and dry.  Neurological:     General: No focal deficit present.     Mental Status: She is alert.     Cranial Nerves: No cranial nerve deficit.     Comments: Patient is awake and alert and following commands.  She does have baseline dementia.  She moves all extremities and appears neurologically intact.    ED Results / Procedures / Treatments   Labs (all labs ordered are listed, but only abnormal results are displayed) Labs Reviewed - No data to display  EKG EKG Interpretation  Date/Time:  Thursday May 02 2022 04:49:05 EDT Ventricular Rate:  89 PR Interval:  158 QRS Duration: 104 QT Interval:  418 QTC Calculation: 509 R Axis:   24 Text Interpretation: Sinus rhythm Minimal ST depression, anterolateral leads Prolonged QT interval No significant change since prior ecg Confirmed by Veryl Speak [phone]) on 05/02/2022 5:07:15 AM  Radiology No results  found.  Procedures Procedures    Medications Ordered in ED Medications - No data to display  ED Course/ Medical Decision Making/ A&P  Patient sent from her extended care facility for evaluation of a fall.  I am told that she attempted to walk to the bathroom, became weak, then was lowered to the floor by staff.  They also report that she seems "not quite herself".  Patient arrives here with stable vital signs and is afebrile.  Her physical examination is basically unremarkable.  She does  have underlying dementia, but appears neurologically intact.  I see no overt signs of trauma to the head or extremities.  She has good range of motion of the hips and legs.  Work-up initiated including CT scan of the head which showed no acute abnormality or injury.  Laboratory studies show an unremarkable CBC and basic metabolic panel.  At this point, patient seems appropriate for discharge.  Patient's son is present at bedside and is comfortable with the disposition.  He seems to think that she is back to her baseline and is comfortable with driving her back to her facility.  It was brought to my attention that she received vaccines for COVID, influenza, and RSV just yesterday.  Perhaps some of her weakness is related to this.  Final Clinical Impression(s) / ED Diagnoses Final diagnoses:  None    Rx / DC Orders ED Discharge Orders     None         Veryl Speak, MD 05/02/22 [phone]

## 2022-05-02 NOTE — ED Notes (Signed)
EMS reports facility gave pt flu shot, covid shot and RSV shot yesterday.

## 2022-08-15 NOTE — Progress Notes (Signed)
Cardiology Clinic Note   Patient Name: [name] Date of Encounter: 08/16/2022  Primary Care Provider:  Sharilyn Sites, MD Primary Cardiologist:  Rozann Lesches, MD  Patient Profile    89 year old female with past medical history of dementia, diabetes, hypertension, hyperlipidemia, paroxysmal atrial fibrillation not on anticoagulation in the setting of dementia and falls, chronic combined systolic and diastolic congestive heart failure, ascending aortic aneurysm, and CVA, who is here today for follow-up.  Past Medical History    Past Medical History:  Diagnosis Date   Anemia    Atrial fibrillation ([place])    Cardiomyopathy ([place])    a. 10/2019: EF 35-40% by echo --> medical management recommended.    Dementia ([place])    Depression    Diverticulitis 8/09, 3/10   Abscess in 2009 (medical tx)   Essential hypertension    Hyperlipidemia    Hyperthyroidism    Radiation, surgery   IDA (iron deficiency anemia)    Osteoporosis    Postoperative anemia due to acute blood loss 09/08/2016   Thalassemia    Tubular adenoma of colon 12/15/08   Colonosocpy Dr Tonita Cong removed cecum   Type 2 diabetes mellitus ([place])    Past Surgical History:  Procedure Laterality Date   CESAREAN SECTION     x2   CHOLECYSTECTOMY     FOOT SURGERY     bilat   INTRAMEDULLARY (IM) NAIL INTERTROCHANTERIC Left 09/06/2016   Procedure: LEFT HIP INTRAMEDULLARY (IM) NAIL;  Surgeon: Renette Butters, MD;  Location: [facility];  Service: Orthopedics;  Laterality: Left;   THYROIDECTOMY, PARTIAL      Allergies  Allergies  Allergen Reactions   Bee Venom     Listed per Nursing Center-No reaction is noted   Procaine Hcl Other (See Comments)    Numbness/jerkiness   Sulfonamide Derivatives Other (See Comments)    hypertension   Penicillins Rash    History of Present Illness    [name] is a 89 year old female with previously mentioned past medical history of dementia, diabetes, hypertension,  hyperlipidemia, paroxysmal atrial fibrillation not on anticoagulation send dimension falls, chronic combined systolic and diastolic congestive heart failure, ascending aortic aneurysm, and CVA.  Last echocardiogram was completed 11/2019 which revealed an LVEF of 35-40%, left ventricle demonstrates global hypokinesis, mild left ventricular hypertrophy, G1 DD, left atrium is moderately dilated, mild mitral regurgitation, aortic regurgitation is mild.  She was last seen in clinic by Dr. Domenic Polite on 11/06/20.  At that time she had no specific complaints and is doing well in general.  Her aspirin had been reduced to 81 mg daily.  There were no further medication changes or further testing that was ordered.  She was evaluated in the emergency department at [facility]/20[facility] after sustaining a mechanical fall at [facility].  She denied any pain or discomfort.  Vitals were stable and his CT scan of the head showed no acute abnormality or injury, lab studies were unremarkable CBC and BMP.  She just recently received COVID, influenza and RSV vaccine which could have played a role in her weakness.  But her workup was unrevealing and she was subsequently discharged back to the facility.  She returns to clinic today accompanied by a facility worker. She states that she is doing well.  She states that her appetite has been well, her activity has been at a good level, and she has no problems sleeping at night.  Denies any chest pain, shortness of breath, dizziness, falls, or peripheral  edema. Denies any recent hospitalizations or visits to the emergency department.   Home Medications    Current Outpatient Medications  Medication Sig Dispense Refill   acetaminophen (TYLENOL) 325 MG tablet Take 2 tablets (650 mg total) by mouth every 6 (six) hours as needed for mild pain, fever or headache. 30 tablet 1   alendronate (FOSAMAX) 70 MG tablet Take 70 mg by mouth every Wednesday. Take with a full glass of water on an empty  stomach.     aspirin EC 81 MG tablet Take 1 tablet (81 mg total) by mouth daily. Swallow whole. 90 tablet 3   calcium citrate-vitamin D 500-400 MG-UNIT chewable tablet Chew by mouth 2 (two) times daily.     calcium-vitamin D (OSCAL WITH D) 500-200 MG-UNIT tablet Take 1 tablet by mouth.     carvedilol (COREG) 6.25 MG tablet Take 1 tablet (6.25 mg total) by mouth 2 (two) times daily with a meal. 60 tablet 3   Cranberry 450 MG TABS Take 450 mg by mouth 2 (two) times daily.     cycloSPORINE (RESTASIS) 0.05 % ophthalmic emulsion Place 1 drop into both eyes 2 (two) times daily.     donepezil (ARICEPT) 5 MG tablet Take 1 tablet (5 mg total) by mouth at bedtime. 30 tablet 3   fish oil-omega-3 fatty acids 1000 MG capsule Take 2 g by mouth daily.     isosorbide mononitrate (IMDUR) 30 MG 24 hr tablet Take 0.5 tablets (15 mg total) by mouth every evening. 30 tablet 3   losartan (COZAAR) 25 MG tablet Take 25 mg by mouth daily.     meclizine (ANTIVERT) 12.5 MG tablet Take 12.5 mg by mouth every 6 (six) hours as needed for dizziness.     memantine (NAMENDA) 10 MG tablet Take 1 tablet (10 mg total) by mouth 2 (two) times daily. 60 tablet 3   nortriptyline (PAMELOR) 25 MG capsule Take 10 mg by mouth at bedtime.     senna (SENOKOT) 8.6 MG TABS tablet Take 2 tablets by mouth at bedtime.     simvastatin (ZOCOR) 40 MG tablet Take 1 tablet (40 mg total) by mouth every evening. 30 tablet 3   spironolactone (ALDACTONE) 25 MG tablet Take 0.5 tablets (12.5 mg total) by mouth daily. 30 tablet 3   tamsulosin (FLOMAX) 0.4 MG CAPS capsule Take 1 capsule (0.4 mg total) by mouth daily after supper. 30 capsule 0   vitamin B-12 (CYANOCOBALAMIN) 500 MCG tablet Take 1,000 mcg by mouth daily.     No current facility-administered medications for this visit.     Family History    Family History  Problem Relation Age of Onset   Pneumonia Father    Coronary artery disease Mother    Stroke Mother    Colon cancer Mother 32    Thalassemia Sister    She indicated that the status of her mother is unknown. She indicated that her father is deceased. She indicated that the status of her sister is unknown.  Social History    Social History   Socioeconomic History   Marital status: Widowed    Spouse name: Not on file   Number of children: 4   Years of education: 10th grade   Highest education level: Not on file  Occupational History   Occupation: house wife     Employer: RETIRED  Tobacco Use   Smoking status: Never   Smokeless tobacco: Never  Substance and Sexual Activity   Alcohol use: Never  Drug use: No   Sexual activity: Not on file  Other Topics Concern   Not on file  Social History Narrative   Lost 1 son-mental illness, htn age 89   Social Determinants of Health   Financial Resource Strain: Not on file  Food Insecurity: Not on file  Transportation Needs: Not on file  Physical Activity: Not on file  Stress: Not on file  Social Connections: Not on file  Intimate Partner Violence: Not on file     Review of Systems    General:  No chills, fever, night sweats or weight changes.  Endorses exertional fatigue Cardiovascular:  No chest pain, dyspnea on exertion, edema, orthopnea, palpitations, paroxysmal nocturnal dyspnea. Dermatological: No rash, lesions/masses Respiratory: No cough, dyspnea Urologic: No hematuria, dysuria Abdominal:   No nausea, vomiting, diarrhea, bright red blood per rectum, melena, or hematemesis Neurologic:  No visual changes, wkns, changes in mental status. All other systems reviewed and are otherwise negative except as noted above.   Physical Exam    VS:  BP 130/64   Pulse 60   Wt 145 lb (65.8 kg)   SpO2 96%   BMI 24.89 kg/m  , BMI Body mass index is 24.89 kg/m.     GEN: Well nourished, well developed, in no acute distress. HEENT: normal. Neck: Supple, no JVD, carotid bruits, or masses. Cardiac: RRR, I/VI systolic murmur, without rubs, or gallops. No clubbing,  cyanosis, trace pretibial edema.  Radials 2+/PT 2+ and equal bilaterally.  Respiratory:  Respirations regular and unlabored, clear to auscultation bilaterally. GI: Soft, nontender, nondistended, BS + x 4. MS: no deformity or atrophy. Skin: warm and dry, no rash. Neuro:  Strength and sensation are intact. Psych: Normal affect.  Accessory Clinical Findings    ECG personally reviewed by me today-new tracings were completed today  Lab Results  Component Value Date   WBC 10.4 05/02/2022   HGB 9.9 (L) 05/02/2022   HCT 32.3 (L) 05/02/2022   MCV 64.3 (L) 05/02/2022   PLT 205 05/02/2022   Lab Results  Component Value Date   CREATININE 0.86 05/02/2022   BUN 13 05/02/2022   NA 138 05/02/2022   K 4.3 05/02/2022   CL 104 05/02/2022   CO2 26 05/02/2022   Lab Results  Component Value Date   ALT 15 02/24/2020   AST 15 02/24/2020   ALKPHOS 47 02/24/2020   BILITOT 0.7 02/24/2020   Lab Results  Component Value Date   CHOL 144 01/27/2020   HDL 50 01/27/2020   LDLCALC 79 01/27/2020   TRIG 76 01/27/2020   CHOLHDL 2.9 01/27/2020    Lab Results  Component Value Date   HGBA1C 6.1 (H) 01/27/2020    Assessment & Plan   1.  Paroxysmal atrial fibrillation, on exam today sounds sinus.  CHA2DS2-VASc score of at least 6 who is currently not anticoagulated due to history of dementia and falls.  She is continued on aspirin 81 mg daily, and carvedilol 6.25 mg twice daily.  2.  Chronic combined systolic and diastolic congestive heart failure.  She denies any shortness of breath or peripheral edema today.  Appears to be euvolemic on exam.  Last echocardiogram revealed LVEF of 35-40%.  She is continued with conservative management.  She is continued on carvedilol losartan and Aldactone.   3.  Essential hypertension with a blood pressure of 130/64 that remains stable.  She is continued on her current medication regimen without changes made today.  4.  Mixed hyperlipidemia where she  is on  simvastatin 10 mg daily.  Requesting facility to draw lipid panel.  5.  Disposition patient return to clinic to see MD/APP in 1 year or sooner if needed.  We also requesting her most recent labs from the facility she resides as well as to draw a lipid panel on her next blood draw.   Meliton Samad, NP 08/16/2022, 2:39 PM

## 2022-08-16 ENCOUNTER — Ambulatory Visit: Payer: Medicare HMO | Attending: Cardiology | Admitting: Cardiology

## 2022-08-16 ENCOUNTER — Encounter: Payer: Self-pay | Admitting: Cardiology

## 2022-08-16 VITALS — BP 130/64 | HR 60 | Wt 145.0 lb

## 2022-08-16 DIAGNOSIS — I480 Paroxysmal atrial fibrillation: Secondary | ICD-10-CM

## 2022-08-16 DIAGNOSIS — I10 Essential (primary) hypertension: Secondary | ICD-10-CM | POA: Diagnosis not present

## 2022-08-16 DIAGNOSIS — I5042 Chronic combined systolic (congestive) and diastolic (congestive) heart failure: Secondary | ICD-10-CM | POA: Diagnosis not present

## 2022-08-16 DIAGNOSIS — E7800 Pure hypercholesterolemia, unspecified: Secondary | ICD-10-CM

## 2022-08-16 NOTE — Patient Instructions (Signed)
Medication Instructions:  Your physician recommends that you continue on your current medications as directed. Please refer to the Current Medication list given to you today.   Labwork: To be determined, please fax labs to [phone]  Testing/Procedures: None today  Follow-Up: 1 year  Any Other Special Instructions Will Be Listed Below (If Applicable).  If you need a refill on your cardiac medications before your next appointment, please call your pharmacy.

## 2023-11-13 ENCOUNTER — Telehealth (INDEPENDENT_AMBULATORY_CARE_PROVIDER_SITE_OTHER): Payer: Self-pay

## 2023-11-13 ENCOUNTER — Ambulatory Visit (INDEPENDENT_AMBULATORY_CARE_PROVIDER_SITE_OTHER): Admitting: Physician Assistant

## 2023-11-13 VITALS — BP 138/77 | HR 62 | Ht 62.0 in | Wt 143.0 lb

## 2023-11-13 DIAGNOSIS — H6123 Impacted cerumen, bilateral: Secondary | ICD-10-CM | POA: Diagnosis not present

## 2023-11-13 NOTE — Patient Instructions (Signed)
 Please place 5 to 10 drops of carbamide peroxide (Debrox) into the affected ear. Keep the drops in your ear for several minutes by keeping your head tilted or placing a cotton ball in your ear. If needed, continue to use carbamide peroxide (Debrox) twice daily for up to 4 days.

## 2023-11-13 NOTE — Telephone Encounter (Signed)
 I attempted to call but no answer, I will try again

## 2023-11-13 NOTE — Progress Notes (Signed)
 Dear Dr. Glady Laming, Here is my assessment for our mutual patient, [name]. Thank you for allowing me the opportunity to care for your patient. Please do not hesitate to contact me should you have any other questions. Sincerely, Belma Boxer PA-C  Otolaryngology Clinic Note Referring provider: Dr. Glady Laming HPI:  [name] is a 89 y.o. female kindly referred by Dr. Glady Laming   The patient is a 49 female seen in our office for evaluation of cerumen impaction.  She is accompanied by her daughter-in-law.  She notes the patient has dementia and cannot provide full history.  She notes some baseline hearing deficits which appears to be at baseline.  She was noted to have cerumen impaction at her facility.  She is uncertain if it was attempted at the facility.  No other information noted today.     Independent Review of Additional Tests or Records:  none   PMH/Meds/All/SocHx/FamHx/ROS:   Past Medical History:  Diagnosis Date   Anemia    Atrial fibrillation ([facility])    Cardiomyopathy ([facility])    a. 10/2019: EF 35-40% by echo --> medical management recommended.    Dementia ([facility])    Depression    Diverticulitis 8/09, 3/10   Abscess in 2009 (medical tx)   Essential hypertension    Hyperlipidemia    Hyperthyroidism    Radiation, surgery   IDA (iron  deficiency anemia)    Osteoporosis    Postoperative anemia due to acute blood loss 09/08/2016   Thalassemia    Tubular adenoma of colon 12/15/08   Colonosocpy Dr Carie Charity removed cecum   Type 2 diabetes mellitus ([facility])      Past Surgical History:  Procedure Laterality Date   CESAREAN SECTION     x2   CHOLECYSTECTOMY     FOOT SURGERY     bilat   INTRAMEDULLARY (IM) NAIL INTERTROCHANTERIC Left 09/06/2016   Procedure: LEFT HIP INTRAMEDULLARY (IM) NAIL;  Surgeon: Saundra Curl, MD;  Location: [facility];  Service: Orthopedics;  Laterality: Left;   THYROIDECTOMY, PARTIAL      Family History  Problem Relation Age of Onset   Pneumonia  Father    Coronary artery disease Mother    Stroke Mother    Colon cancer Mother 46   Thalassemia Sister      Social Connections: Not on file      Current Outpatient Medications:    acetaminophen  (TYLENOL ) 325 MG tablet, Take 2 tablets (650 mg total) by mouth every 6 (six) hours as needed for mild pain, fever or headache., Disp: 30 tablet, Rfl: 1   alendronate (FOSAMAX) 70 MG tablet, Take 70 mg by mouth every Wednesday. Take with a full glass of water on an empty stomach., Disp: , Rfl:    aspirin  EC 81 MG tablet, Take 1 tablet (81 mg total) by mouth daily. Swallow whole., Disp: 90 tablet, Rfl: 3   calcium citrate-vitamin D 500-400 MG-UNIT chewable tablet, Chew by mouth 2 (two) times daily., Disp: , Rfl:    calcium-vitamin D (OSCAL WITH D) 500-200 MG-UNIT tablet, Take 1 tablet by mouth., Disp: , Rfl:    carvedilol  (COREG ) 6.25 MG tablet, Take 1 tablet (6.25 mg total) by mouth 2 (two) times daily with a meal., Disp: 60 tablet, Rfl: 3   Cranberry 450 MG TABS, Take 450 mg by mouth 2 (two) times daily., Disp: , Rfl:    cycloSPORINE  (RESTASIS ) 0.05 % ophthalmic emulsion, Place 1 drop into both eyes 2 (two) times daily., Disp: , Rfl:  donepezil  (ARICEPT ) 5 MG tablet, Take 1 tablet (5 mg total) by mouth at bedtime., Disp: 30 tablet, Rfl: 3   fish oil-omega-3 fatty acids 1000 MG capsule, Take 2 g by mouth daily., Disp: , Rfl:    isosorbide  mononitrate (IMDUR ) 30 MG 24 hr tablet, Take 0.5 tablets (15 mg total) by mouth every evening., Disp: 30 tablet, Rfl: 3   losartan  (COZAAR ) 25 MG tablet, Take 25 mg by mouth daily., Disp: , Rfl:    meclizine  (ANTIVERT ) 12.5 MG tablet, Take 12.5 mg by mouth every 6 (six) hours as needed for dizziness., Disp: , Rfl:    memantine  (NAMENDA ) 10 MG tablet, Take 1 tablet (10 mg total) by mouth 2 (two) times daily., Disp: 60 tablet, Rfl: 3   nortriptyline  (PAMELOR ) 25 MG capsule, Take 10 mg by mouth at bedtime., Disp: , Rfl:    senna (SENOKOT) 8.6 MG TABS tablet, Take  2 tablets by mouth at bedtime., Disp: , Rfl:    simvastatin  (ZOCOR ) 40 MG tablet, Take 1 tablet (40 mg total) by mouth every evening., Disp: 30 tablet, Rfl: 3   spironolactone  (ALDACTONE ) 25 MG tablet, Take 0.5 tablets (12.5 mg total) by mouth daily., Disp: 30 tablet, Rfl: 3   tamsulosin  (FLOMAX ) 0.4 MG CAPS capsule, Take 1 capsule (0.4 mg total) by mouth daily after supper., Disp: 30 capsule, Rfl: 0   vitamin B-12 (CYANOCOBALAMIN) 500 MCG tablet, Take 1,000 mcg by mouth daily., Disp: , Rfl:    Physical Exam:   BP 138/77 (BP Location: Left Arm, Patient Position: Sitting)   Pulse 62   Ht 5\' 2"  (1.575 m)   Wt 143 lb (64.9 kg)   SpO2 93%   BMI 26.16 kg/m   Pertinent Findings  CN II-XII intact Bilateral cerumen impactions of the external auditory canal Weber 512: equal Rinne 512: AC > BC b/l  Anterior rhinoscopy: Septum midline; No obviously  neck masses/lymphadenopathy/thyromegaly No respiratory distress or stridor  Seprately Identifiable Procedures:  None  Impression & Plans:  [name] is a 89 y.o. female with the following   Cerumen impactions-  The patient presents with bilateral cerumen impactions.  This is very hard.  I was able to manipulate the cerumen but several attempts were made to remove this, the patient was unable to sit still and any movement of the cerumen caused pain and her to violently reach away.  I was unable to complete the cerumen removal today, I have advised her to use Debrox drops twice a day for the next 7 days.  I like to see her back in the office in 1 to 2 weeks for reevaluation and removal of the cerumen.  The patient's daughter-in-law verbalized understanding and agreement to today's plan.   - f/u 1 to 2 weeks   Thank you for allowing me the opportunity to care for your patient. Please do not hesitate to contact me should you have any other questions.  Sincerely, Belma Boxer PA-C [facility] Phone: [phone] Fax:  [phone]  11/13/2023, 10:47 AM

## 2023-12-01 ENCOUNTER — Ambulatory Visit (INDEPENDENT_AMBULATORY_CARE_PROVIDER_SITE_OTHER): Admitting: Physician Assistant

## 2023-12-01 ENCOUNTER — Encounter (INDEPENDENT_AMBULATORY_CARE_PROVIDER_SITE_OTHER): Payer: Self-pay | Admitting: Physician Assistant

## 2023-12-01 VITALS — BP 151/87 | HR 67 | Ht 63.0 in | Wt 140.0 lb

## 2023-12-01 DIAGNOSIS — H6121 Impacted cerumen, right ear: Secondary | ICD-10-CM

## 2023-12-01 DIAGNOSIS — H6122 Impacted cerumen, left ear: Secondary | ICD-10-CM

## 2023-12-01 NOTE — Progress Notes (Signed)
 Dear Dr. Glady Laming, Here is my assessment for our mutual patient, [name]. Thank you for allowing me the opportunity to care for your patient. Please do not hesitate to contact me should you have any other questions. Sincerely, Belma Boxer PA-C  Otolaryngology Clinic Note Referring provider: Dr. Glady Laming HPI:  [name] is a 89 y.o. female kindly referred by Dr. Glady Laming   The patient is a 89 year old female seen in our office for follow-up evaluation of cerumen impaction.  The patient was last seen in the office on 11/13/2023.  Below is a recap of that encounter.   The patient is a 34 female seen in our office for evaluation of cerumen impaction.  She is accompanied by her daughter-in-law.  She notes the patient has dementia and cannot provide full history.  She notes some baseline hearing deficits which appears to be at baseline.  She was noted to have cerumen impaction at her facility.  She is uncertain if it was attempted at the facility.  No other information noted today.    Update 12/01/2023  The patient presents with her son today.  She is unable to provide any significant information given the level of dementia.  He notes they have been using drops in her ear.   Independent Review of Additional Tests or Records:  Previous office visit note on 11/13/2023   PMH/Meds/All/SocHx/FamHx/ROS:   Past Medical History:  Diagnosis Date   Anemia    Atrial fibrillation (HCC)    Cardiomyopathy (HCC)    a. 10/2019: EF 35-40% by echo --> medical management recommended.    Dementia (HCC)    Depression    Diverticulitis 8/09, 3/10   Abscess in 2009 (medical tx)   Essential hypertension    Hyperlipidemia    Hyperthyroidism    Radiation, surgery   IDA (iron  deficiency anemia)    Osteoporosis    Postoperative anemia due to acute blood loss 09/08/2016   Thalassemia    Tubular adenoma of colon 12/15/08   Colonosocpy Dr Carie Charity removed cecum   Type 2 diabetes mellitus (HCC)       Past Surgical History:  Procedure Laterality Date   CESAREAN SECTION     x2   CHOLECYSTECTOMY     FOOT SURGERY     bilat   INTRAMEDULLARY (IM) NAIL INTERTROCHANTERIC Left 09/06/2016   Procedure: LEFT HIP INTRAMEDULLARY (IM) NAIL;  Surgeon: Saundra Curl, MD;  Location: [facility] OR;  Service: Orthopedics;  Laterality: Left;   THYROIDECTOMY, PARTIAL      Family History  Problem Relation Age of Onset   Pneumonia Father    Coronary artery disease Mother    Stroke Mother    Colon cancer Mother 65   Thalassemia Sister      Social Connections: Not on file      Current Outpatient Medications:    acetaminophen  (TYLENOL ) 325 MG tablet, Take 2 tablets (650 mg total) by mouth every 6 (six) hours as needed for mild pain, fever or headache., Disp: 30 tablet, Rfl: 1   alendronate (FOSAMAX) 70 MG tablet, Take 70 mg by mouth every Wednesday. Take with a full glass of water on an empty stomach., Disp: , Rfl:    aspirin  EC 81 MG tablet, Take 1 tablet (81 mg total) by mouth daily. Swallow whole., Disp: 90 tablet, Rfl: 3   calcium citrate-vitamin D 500-400 MG-UNIT chewable tablet, Chew by mouth 2 (two) times daily., Disp: , Rfl:    calcium-vitamin D (OSCAL WITH D) 500-200 MG-UNIT tablet,  Take 1 tablet by mouth., Disp: , Rfl:    carvedilol  (COREG ) 6.25 MG tablet, Take 1 tablet (6.25 mg total) by mouth 2 (two) times daily with a meal., Disp: 60 tablet, Rfl: 3   Cranberry 450 MG TABS, Take 450 mg by mouth 2 (two) times daily., Disp: , Rfl:    cycloSPORINE  (RESTASIS ) 0.05 % ophthalmic emulsion, Place 1 drop into both eyes 2 (two) times daily., Disp: , Rfl:    donepezil  (ARICEPT ) 5 MG tablet, Take 1 tablet (5 mg total) by mouth at bedtime., Disp: 30 tablet, Rfl: 3   fish oil-omega-3 fatty acids 1000 MG capsule, Take 2 g by mouth daily., Disp: , Rfl:    isosorbide  mononitrate (IMDUR ) 30 MG 24 hr tablet, Take 0.5 tablets (15 mg total) by mouth every evening., Disp: 30 tablet, Rfl: 3   losartan  (COZAAR ) 25  MG tablet, Take 25 mg by mouth daily., Disp: , Rfl:    meclizine  (ANTIVERT ) 12.5 MG tablet, Take 12.5 mg by mouth every 6 (six) hours as needed for dizziness., Disp: , Rfl:    memantine  (NAMENDA ) 10 MG tablet, Take 1 tablet (10 mg total) by mouth 2 (two) times daily., Disp: 60 tablet, Rfl: 3   nortriptyline  (PAMELOR ) 25 MG capsule, Take 10 mg by mouth at bedtime., Disp: , Rfl:    senna (SENOKOT) 8.6 MG TABS tablet, Take 2 tablets by mouth at bedtime., Disp: , Rfl:    simvastatin  (ZOCOR ) 40 MG tablet, Take 1 tablet (40 mg total) by mouth every evening., Disp: 30 tablet, Rfl: 3   spironolactone  (ALDACTONE ) 25 MG tablet, Take 0.5 tablets (12.5 mg total) by mouth daily., Disp: 30 tablet, Rfl: 3   tamsulosin  (FLOMAX ) 0.4 MG CAPS capsule, Take 1 capsule (0.4 mg total) by mouth daily after supper., Disp: 30 capsule, Rfl: 0   vitamin B-12 (CYANOCOBALAMIN) 500 MCG tablet, Take 1,000 mcg by mouth daily., Disp: , Rfl:    Physical Exam:   There were no vitals taken for this visit.  Pertinent Findings  CN II-XII intact Right external auditory canal with minimal cerumen, TM intact with well pneumatized middle ear space, left external auditory canal with cerumen impaction, after disimpaction, canal clean with no erythema, TM intact with well pneumatized middle ear space Weber 512: Localizes right Rinne 512: AC > BC b/l  No respiratory distress or stridor  Seprately Identifiable Procedures:  Procedure: bilateral ear microscopy and cerumen removal using microscope (CPT [phone]) - Mod 25 Pre-procedure diagnosis: unilateral cerumen impaction right external auditory canal Post-procedure diagnosis: same Indication: bilateral cerumen impaction; given patient's otologic complaints and history as well as for improved and comprehensive examination of external ear and tympanic membrane, bilateral otologic examination using microscope was performed and impacted cerumen removed  Procedure: Patient was placed  semi-recumbent. Both ear canals were examined using the microscope with findings above. Cerumen removed from the right external auditory canal using suction and currette with improvement in EAC examination and patency. Left: EAC was patent. TM was intact . Middle ear was aerated. Drainage: none Right: EAC was patent. TM was intact . Middle ear was aerated . Drainage: none Patient tolerated the procedure well.   Impression & Plans:  Tatayana Beshears is a 89 y.o. female with the following   Cerumen impaction-  Right-sided cerumen impaction removed without difficulty, no complications.  I recommended hearing evaluation, the patient son notes that given her level of dementia that this would not provide a significant improvement in quality of life.  They  may follow-up with us  on a as needed basis.  She will use Debrox as needed for any cerumen.   - f/u PRN   Thank you for allowing me the opportunity to care for your patient. Please do not hesitate to contact me should you have any other questions.  Sincerely, Belma Boxer PA-C [facility] Phone: [phone] Fax: [phone]  12/01/2023, 10:47 AM

## 2024-03-28 ENCOUNTER — Emergency Department (HOSPITAL_COMMUNITY)

## 2024-03-28 ENCOUNTER — Other Ambulatory Visit: Payer: Self-pay

## 2024-03-28 ENCOUNTER — Emergency Department (HOSPITAL_COMMUNITY)
Admission: EM | Admit: 2024-03-28 | Discharge: 2024-03-28 | Disposition: A | Discharge status: Home / Self Care | Attending: Emergency Medicine | Admitting: Emergency Medicine

## 2024-03-28 DIAGNOSIS — Z7982 Long term (current) use of aspirin: Secondary | ICD-10-CM | POA: Insufficient documentation

## 2024-03-28 DIAGNOSIS — Y939 Activity, unspecified: Secondary | ICD-10-CM | POA: Diagnosis not present

## 2024-03-28 DIAGNOSIS — W19XXXA Unspecified fall, initial encounter: Secondary | ICD-10-CM | POA: Insufficient documentation

## 2024-03-28 DIAGNOSIS — I480 Paroxysmal atrial fibrillation: Secondary | ICD-10-CM | POA: Diagnosis not present

## 2024-03-28 DIAGNOSIS — M7062 Trochanteric bursitis, left hip: Secondary | ICD-10-CM | POA: Insufficient documentation

## 2024-03-28 DIAGNOSIS — E119 Type 2 diabetes mellitus without complications: Secondary | ICD-10-CM | POA: Diagnosis not present

## 2024-03-28 DIAGNOSIS — F0390 Unspecified dementia without behavioral disturbance: Secondary | ICD-10-CM | POA: Insufficient documentation

## 2024-03-28 NOTE — ED Triage Notes (Signed)
 Pt arrived via [facility] from [place] c/o unwitnessed fall. Pt denies hitting head and denies LOC but staff, per EMS, stated, it sounded like she hit her head, denies any use of blood thinners. Denies any pain at this time

## 2024-03-28 NOTE — ED Notes (Signed)
 [facility] will be taking patient back to [facility], [facility] is aware of patients ambulation status being assist with one person.

## 2024-03-28 NOTE — ED Provider Notes (Signed)
 [name] Provider Note   CSN: [id number] Arrival date & time: 03/28/24  1001     Patient presents with: [name] is a 89 y.o. female.  Has history of dementia, diabetes, anemia, paroxysmal A-fib, CVA, cardiomyopathy.  She presents from [place] today for unwitnessed fall.  Patient denies any pain or injuries and does not know why she is here, states she feels fine.  EMS had stated that she had fallen and they were concerned she may have hit her head.  She is not on blood thinners.  She denies feeling dizzy, denies chest pain or shortness of breath or abdominal pain.  Patient's husband arrived at bedside to provide some additional history and states she complained of some left hip pain with movement on the bed.  He states she is at her baseline.   Fall       Prior to Admission medications   Medication Sig Start Date End Date Taking? Authorizing Provider  acetaminophen  (TYLENOL ) 325 MG tablet Take 2 tablets (650 mg total) by mouth every 6 (six) hours as needed for mild pain, fever or headache. 11/18/19   Vanessa Manus, Miles  alendronate (FOSAMAX) 70 MG tablet Take 70 mg by mouth every Wednesday. Take with a full glass of water on an empty stomach.    Provider, Historical, Miles  aspirin  EC 81 MG tablet Take 1 tablet (81 mg total) by mouth daily. Swallow whole. 11/06/20   Vanessa Jayson MATSU, Miles  calcium citrate-vitamin D 500-400 MG-UNIT chewable tablet Chew by mouth 2 (two) times daily.    Provider, Historical, Miles  calcium-vitamin D (OSCAL WITH D) 500-200 MG-UNIT tablet Take 1 tablet by mouth.    Provider, Historical, Miles  carvedilol  (COREG ) 6.25 MG tablet Take 1 tablet (6.25 mg total) by mouth 2 (two) times daily with a meal. 11/18/19   Emokpae, Courage, Miles  Cranberry 450 MG TABS Take 450 mg by mouth 2 (two) times daily.    Provider, Historical, Miles  cycloSPORINE  (RESTASIS ) 0.05 % ophthalmic emulsion Place 1 drop into  both eyes 2 (two) times daily.    Provider, Historical, Miles  donepezil  (ARICEPT ) 5 MG tablet Take 1 tablet (5 mg total) by mouth at bedtime. 11/18/19   Vanessa Manus, Miles  fish oil-omega-3 fatty acids 1000 MG capsule Take 2 g by mouth daily.    Provider, Historical, Miles  isosorbide  mononitrate (IMDUR ) 30 MG 24 hr tablet Take 0.5 tablets (15 mg total) by mouth every evening. 11/18/19   Vanessa Manus, Miles  losartan  (COZAAR ) 25 MG tablet Take 25 mg by mouth daily.    Provider, Historical, Miles  meclizine  (ANTIVERT ) 12.5 MG tablet Take 12.5 mg by mouth every 6 (six) hours as needed for dizziness.    Provider, Historical, Miles  memantine  (NAMENDA ) 10 MG tablet Take 1 tablet (10 mg total) by mouth 2 (two) times daily. 11/18/19   Vanessa Manus, Miles  nortriptyline  (PAMELOR ) 25 MG capsule Take 10 mg by mouth at bedtime.    Provider, Historical, Miles  senna (SENOKOT) 8.6 MG TABS tablet Take 2 tablets by mouth at bedtime.    Provider, Historical, Miles  simvastatin  (ZOCOR ) 40 MG tablet Take 1 tablet (40 mg total) by mouth every evening. 11/18/19   Vanessa Manus, Miles  spironolactone  (ALDACTONE ) 25 MG tablet Take 0.5 tablets (12.5 mg total) by mouth daily. 11/19/19   Vanessa Manus, Miles  tamsulosin  (FLOMAX ) 0.4 MG CAPS capsule Take 1 capsule (  0.4 mg total) by mouth daily after supper. 11/18/19   Vanessa Manus, Miles  vitamin B-12 (CYANOCOBALAMIN) 500 MCG tablet Take 1,000 mcg by mouth daily.    Provider, Historical, Miles    Allergies: Bee venom, Procaine hcl, Sulfonamide derivatives, and Penicillins    Review of Systems  Updated Vital Signs BP (!) 160/68 (BP Location: Left Arm)   Pulse (!) 59   Temp 98 F (36.7 C) (Oral)   Resp 17   SpO2 94%   Physical Exam Vitals and nursing note reviewed.  Constitutional:      General: She is not in acute distress.    Appearance: She is well-developed.  HENT:     Head: Normocephalic and atraumatic.  Eyes:     Extraocular Movements: Extraocular movements intact.      Conjunctiva/sclera: Conjunctivae normal.     Pupils: Pupils are equal, round, and reactive to light.  Cardiovascular:     Rate and Rhythm: Normal rate and regular rhythm.     Heart sounds: No murmur heard. Pulmonary:     Effort: Pulmonary effort is normal. No respiratory distress.     Breath sounds: Normal breath sounds.  Abdominal:     Palpations: Abdomen is soft.     Tenderness: There is no abdominal tenderness.  Musculoskeletal:        General: No swelling or tenderness. Normal range of motion.     Cervical back: Neck supple.     Comments: Tenderness over the left greater greater trochanter, left hip is normal active range of motion.  Distal pulses intact.  No musculoskeletal pain on palpation diffusely otherwise.  Skin:    General: Skin is warm and dry.     Capillary Refill: Capillary refill takes less than 2 seconds.  Neurological:     General: No focal deficit present.     Mental Status: She is alert. Mental status is at baseline.     Sensory: No sensory deficit.     Motor: No weakness.  Psychiatric:        Mood and Affect: Mood normal.     (all labs ordered are listed, but only abnormal results are displayed) Labs Reviewed - No data to display  EKG: None  Radiology: DG Hip Unilat W or Wo Pelvis 2-3 Views Left Result Date: 03/28/2024 EXAM: 2 or more VIEW(S) XRAY OF THE HIP 03/28/2024 12:19:23 PM COMPARISON: CT 06/04/2020 CLINICAL HISTORY: Fall, pain. Per Triage: Pt arrived via [facility] from [facility] c/o unwitnessed fall. Pt denies hitting head and denies LOC but staff, per EMS, stated, it sounded like she hit her head, denies any use of blood thinners. Denies any pain at this time. FINDINGS: BONES AND JOINTS: Chronic healed left intertrochanteric fracture transfixed with intramedullary nail and interlocking femoral neck screw. Degenerative changes of the lower lumbar spine. SOFT TISSUES: Stool throughout the colon. Limited evaluation due to overlapping osseous  structures and overlying soft tissues. IMPRESSION: 1. No acute findings. 2. Chronic healed left intertrochanteric fracture transfixed with intramedullary nail and interlocking femoral neck screw. Electronically signed by: Waddell Calk Miles 03/28/2024 12:30 PM EDT RP Workstation: [id number]   CT Cervical Spine Wo Contrast Result Date: 03/28/2024 CLINICAL DATA:  89 year old female status post unwitnessed fall. EXAM: CT CERVICAL SPINE WITHOUT CONTRAST TECHNIQUE: Multidetector CT imaging of the cervical spine was performed without intravenous contrast. Multiplanar CT image reconstructions were also generated. RADIATION DOSE REDUCTION: This exam was performed according to the departmental dose-optimization program which includes automated exposure control, adjustment of the  mA and/or kV according to patient size and/or use of iterative reconstruction technique. COMPARISON:  Head CT today.  CTA neck 08/01/2009. FINDINGS: Alignment: Chronic straightening of cervical lordosis. Cervicothoracic junction alignment is within normal limits. Mild degenerative appearing anterolisthesis of C4 on C5, associated with facet arthropathy, is chronic but progressed since 2011. Skull base and vertebrae: Osteopenia. Visualized skull base is intact. No atlanto-occipital dissociation. C1 and C2 appear intact and aligned. No acute osseous abnormality identified. Soft tissues and spinal canal: No prevertebral fluid or swelling. No visible canal hematoma. Bulky cervical carotid calcified atherosclerosis on the left. Mild motion artifact. Disc levels: Chronic cervical spine degeneration, including advanced facet arthropathy on the right at C4-C5, advanced disc and endplate degeneration at C5-C6 with vacuum disc. But no significant cervical spinal stenosis by CT. Upper chest: Visible upper thoracic levels appear grossly intact. Mild apical lung scarring. IMPRESSION: 1. No acute traumatic injury identified in the cervical spine. 2. Chronic  cervical spine degeneration. Electronically Signed   By: VEAR Hurst M.D.   On: 03/28/2024 11:23   CT Head Wo Contrast Result Date: 03/28/2024 CLINICAL DATA:  89 year old female status post unwitnessed fall. EXAM: CT HEAD WITHOUT CONTRAST TECHNIQUE: Contiguous axial images were obtained from the base of the skull through the vertex without intravenous contrast. RADIATION DOSE REDUCTION: This exam was performed according to the departmental dose-optimization program which includes automated exposure control, adjustment of the mA and/or kV according to patient size and/or use of iterative reconstruction technique. COMPARISON:  Brain MRI 01/27/2020.  Head CT 05/02/2022. FINDINGS: Brain: Stable cerebral volume. No midline shift, ventriculomegaly, mass effect, evidence of mass lesion, intracranial hemorrhage or evidence of cortically based acute infarction. Asymmetric chronic basal ganglia vascular calcifications. Patchy mild to moderate for age asymmetric cerebral white matter hypodensity is stable, greater in the left hemisphere. Vascular: Calcified atherosclerosis at the skull base. No suspicious intracranial vascular hyperdensity. Skull: Intact, stable since 2023. Sinuses/Orbits: Visualized paranasal sinuses and mastoids are clear. Other: No acute orbit or scalp soft tissue injury identified. There is rightward gaze. IMPRESSION: 1. No acute intracranial abnormality or acute traumatic injury identified. 2. Stable non contrast CT appearance of the brain since 2023. Electronically Signed   By: VEAR Hurst M.D.   On: 03/28/2024 11:19     Procedures   Medications Ordered in the ED - No data to display                                  Medical Decision Making Differential diagnosis includes but limited to fracture, dislocation, sprain, strain, intracranial hemorrhage, other  Course: Patient presents to the ER for evaluation of an unwitnessed fall.  She is on blood thinners.  She has dementia so does not contribute  much history her only complaint was that she did have some mild tenderness on palpation of the left lateral hip but no limited range of motion.  CT head and C-spine ordered for concern of possible head injury and these were negative.  X-ray left hip shows prior arthroplasty but no fracture or dislocation  Amount and/or Complexity of Data Reviewed Radiology: ordered.    Details: CT head-no intracranial hemorrhage CT spine-no traumatic malalignment or fracture X-ray left hip no fracture or traumatic malalignment, chronic healed left intertrochanteric fracture transfixed with intramedullary nail  I agree with radiology read        Final diagnoses:  Fall, initial encounter    ED Discharge  Orders     None          [name] [name] 03/28/24 1345    Towana Ozell BROCKS, Miles 03/28/24 [phone]

## 2024-03-28 NOTE — Discharge Instructions (Addendum)
 Seen today in evaluation for a fall.  Fortunately your CT scan of your head and cervical spine did not show any traumatic injuries and there was no fracture or dislocation of your hip.  You can use over-the-counter medicine as needed for pain.  Follow-up with your PCP.  Come back to the ER for new or worsening symptoms.

## 2024-04-14 ENCOUNTER — Other Ambulatory Visit: Payer: Self-pay

## 2024-04-14 ENCOUNTER — Ambulatory Visit: Attending: Cardiology | Admitting: Cardiology

## 2024-04-14 ENCOUNTER — Encounter: Payer: Self-pay | Admitting: Cardiology

## 2024-04-14 VITALS — BP 130/58 | HR 56 | Ht 61.0 in | Wt 156.0 lb

## 2024-04-14 DIAGNOSIS — I10 Essential (primary) hypertension: Secondary | ICD-10-CM | POA: Diagnosis not present

## 2024-04-14 DIAGNOSIS — I480 Paroxysmal atrial fibrillation: Secondary | ICD-10-CM

## 2024-04-14 DIAGNOSIS — I5020 Unspecified systolic (congestive) heart failure: Secondary | ICD-10-CM

## 2024-04-14 MED ORDER — CARVEDILOL 3.125 MG PO TABS: 3.1250 mg | ORAL_TABLET | Freq: Two times a day (BID) | ORAL | 3 refills | Status: DC

## 2024-04-14 NOTE — Progress Notes (Signed)
    Cardiology Office Note  Date: 04/14/2024   ID: [name], DOB Feb 16, 1929, MRN [id number]  History of Present Illness: [name] is a 89 y.o. female last seen by Ms. Hammock NP in February 2024, I reviewed her note.  I have not seen her since 2022.  She is here today with her son for a follow-up visit.  She is a long-term resident of [facility] here in [place].  I reviewed her facility MAR.  She did not voice any specific concerns, but does have dementia at baseline.  Her weight is up in comparison to earlier in the year, she tells me that she eats 3 meals a day.  No obvious progressive fluid retention on current regimen.  She uses a walker, did have a fall about a month ago but is doing well at this point.  I reviewed her cardiac regimen and discussed reducing Coreg  to 3.125 mg twice daily given her low resting heart rate.  She is on Aricept  which can also contribute to bradycardia as well.  This medicine could ultimately be discontinued if necessary.  I reviewed her ECG today which shows sinus bradycardia at 55 beats per minutes.  I talked with the patient's son today, his preference is that she have care with the facility medical provider going forward, we could certainly see her back on an as-needed basis or potentially do a virtual encounter.  Plan is conservative management from a cardiovascular perspective.  Physical Exam: VS:  BP (!) 130/58   Pulse (!) 56   Ht 5' 1 (1.549 m)   Wt 156 lb (70.8 kg)   SpO2 95%   BMI 29.48 kg/m , BMI Body mass index is 29.48 kg/m.  Wt Readings from Last 3 Encounters:  04/14/24 156 lb (70.8 kg)  12/01/23 140 lb (63.5 kg)  11/13/23 143 lb (64.9 kg)    General: Elderly woman, appears comfortable at rest. HEENT: Conjunctiva and lids normal. Neck: Supple, no elevated JVP or carotid bruits. Lungs: Clear to auscultation, nonlabored breathing at rest. Cardiac: Regular rate and rhythm, no S3, 1/6 systolic murmur. Extremities: No  pitting edema.  ECG:  An ECG dated 05/02/2022 was personally reviewed today and demonstrated:  Sinus rhythm with nonspecific ST changes.  Labwork:  October 2023: Potassium 4.5, BUN 13, creatinine 1.0, GFR 53, AST 21, ALT 20, hemoglobin A1c 6.3%, TSH 1.22, hemoglobin 10.2, platelets 299  Other Studies Reviewed Today:  No interval cardiac testing for review today.  Assessment and Plan:  1.  Paroxysmal atrial fibrillation, CHA2DS2-VASc score of at least 6.  Not anticoagulated with history of dementia and high fall risk.  Heart rate regular today and she is in sinus bradycardia by ECG.  Continue aspirin  81 mg daily at this point.  2.  HFrEF.  LVEF 35 to 40% with global hypokinesis and normal RV contraction as of echocardiogram in 2021.  She has been managed conservatively.  Reduce Coreg  to 3.125 mg twice daily given resting bradycardia.  Otherwise continue Imdur  15 mg daily, Cozaar  25 mg daily, and Aldactone  12.5 mg daily.  3.  Primary hypertension.  Blood pressure control is adequate today.  Continue with regimen outlined above.  4.  Mixed hyperlipidemia.  She continues on Zocor  40 mg daily.  Disposition:  Follow up prn  Signed, Jayson JUDITHANN Sierras, M.D., F.A.C.C. [facility]

## 2024-04-14 NOTE — Patient Instructions (Signed)
 Medication Instructions:   DECREASE Coreg  to 3.125 mg twice a day  Labwork: none today  Testing/Procedures: None today  Follow-Up: As needed  Any Other Special Instructions Will Be Listed Below (If Applicable).  If you need a refill on your cardiac medications before your next appointment, please call your pharmacy.

## 2024-04-14 NOTE — Telephone Encounter (Signed)
 Pharmacy stating that this is not a pt of theirs. Please send to correct pharmacy.

## 2024-04-15 MED ORDER — CARVEDILOL 3.125 MG PO TABS: 3.1250 mg | ORAL_TABLET | Freq: Two times a day (BID) | ORAL | 3 refills | Status: AC
